# Patient Record
Sex: Male | Born: 1935 | Race: White | Hispanic: No | Marital: Married | Smoking: Former smoker
Health system: Southern US, Community
[De-identification: ages and names within clinical notes are randomized; demographics above are authoritative.]

## PROBLEM LIST (undated history)

## (undated) DIAGNOSIS — I2510 Atherosclerotic heart disease of native coronary artery without angina pectoris: Secondary | ICD-10-CM

## (undated) DIAGNOSIS — IMO0001 Reserved for inherently not codable concepts without codable children: Secondary | ICD-10-CM

## (undated) DIAGNOSIS — I82409 Acute embolism and thrombosis of unspecified deep veins of unspecified lower extremity: Secondary | ICD-10-CM

## (undated) DIAGNOSIS — Z9981 Dependence on supplemental oxygen: Secondary | ICD-10-CM

## (undated) DIAGNOSIS — Z8711 Personal history of peptic ulcer disease: Secondary | ICD-10-CM

## (undated) DIAGNOSIS — I509 Heart failure, unspecified: Secondary | ICD-10-CM

## (undated) DIAGNOSIS — Z923 Personal history of irradiation: Secondary | ICD-10-CM

## (undated) DIAGNOSIS — K922 Gastrointestinal hemorrhage, unspecified: Secondary | ICD-10-CM

## (undated) DIAGNOSIS — I10 Essential (primary) hypertension: Secondary | ICD-10-CM

## (undated) DIAGNOSIS — G4730 Sleep apnea, unspecified: Secondary | ICD-10-CM

## (undated) DIAGNOSIS — IMO0002 Reserved for concepts with insufficient information to code with codable children: Secondary | ICD-10-CM

## (undated) DIAGNOSIS — C3492 Malignant neoplasm of unspecified part of left bronchus or lung: Secondary | ICD-10-CM

## (undated) DIAGNOSIS — M1990 Unspecified osteoarthritis, unspecified site: Secondary | ICD-10-CM

## (undated) DIAGNOSIS — Z8719 Personal history of other diseases of the digestive system: Secondary | ICD-10-CM

## (undated) DIAGNOSIS — E119 Type 2 diabetes mellitus without complications: Secondary | ICD-10-CM

## (undated) DIAGNOSIS — C719 Malignant neoplasm of brain, unspecified: Secondary | ICD-10-CM

## (undated) DIAGNOSIS — M109 Gout, unspecified: Secondary | ICD-10-CM

## (undated) DIAGNOSIS — R0602 Shortness of breath: Secondary | ICD-10-CM

## (undated) DIAGNOSIS — Z9289 Personal history of other medical treatment: Secondary | ICD-10-CM

## (undated) DIAGNOSIS — J449 Chronic obstructive pulmonary disease, unspecified: Secondary | ICD-10-CM

## (undated) DIAGNOSIS — I639 Cerebral infarction, unspecified: Secondary | ICD-10-CM

## (undated) DIAGNOSIS — I4891 Unspecified atrial fibrillation: Secondary | ICD-10-CM

## (undated) DIAGNOSIS — R011 Cardiac murmur, unspecified: Secondary | ICD-10-CM

## (undated) DIAGNOSIS — C3491 Malignant neoplasm of unspecified part of right bronchus or lung: Secondary | ICD-10-CM

## (undated) DIAGNOSIS — E785 Hyperlipidemia, unspecified: Secondary | ICD-10-CM

## (undated) DIAGNOSIS — T4145XA Adverse effect of unspecified anesthetic, initial encounter: Secondary | ICD-10-CM

## (undated) DIAGNOSIS — F1010 Alcohol abuse, uncomplicated: Secondary | ICD-10-CM

## (undated) DIAGNOSIS — T7840XA Allergy, unspecified, initial encounter: Secondary | ICD-10-CM

## (undated) HISTORY — DX: Atherosclerotic heart disease of native coronary artery without angina pectoris: I25.10

## (undated) HISTORY — PX: CATARACT EXTRACTION W/ INTRAOCULAR LENS  IMPLANT, BILATERAL: SHX1307

## (undated) HISTORY — DX: Reserved for inherently not codable concepts without codable children: IMO0001

## (undated) HISTORY — DX: Hyperlipidemia, unspecified: E78.5

## (undated) HISTORY — DX: Reserved for concepts with insufficient information to code with codable children: IMO0002

## (undated) HISTORY — PX: COLON SURGERY: SHX602

## (undated) HISTORY — PX: TOTAL KNEE ARTHROPLASTY: SHX125

## (undated) HISTORY — PX: JOINT REPLACEMENT: SHX530

## (undated) HISTORY — PX: INCISE AND DRAIN ABCESS: PRO64

---

## 1999-08-22 ENCOUNTER — Ambulatory Visit (HOSPITAL_COMMUNITY): Admission: RE | Admit: 1999-08-22 | Discharge: 1999-08-22 | Payer: Self-pay | Admitting: *Deleted

## 1999-08-22 HISTORY — PX: CARDIAC CATHETERIZATION: SHX172

## 2000-01-23 ENCOUNTER — Other Ambulatory Visit: Admission: RE | Admit: 2000-01-23 | Discharge: 2000-01-23 | Payer: Self-pay | Admitting: Gastroenterology

## 2000-01-23 ENCOUNTER — Encounter (INDEPENDENT_AMBULATORY_CARE_PROVIDER_SITE_OTHER): Payer: Self-pay

## 2000-01-24 ENCOUNTER — Other Ambulatory Visit: Admission: RE | Admit: 2000-01-24 | Discharge: 2000-01-24 | Payer: Self-pay | Admitting: Gastroenterology

## 2000-05-02 ENCOUNTER — Ambulatory Visit (HOSPITAL_COMMUNITY): Admission: RE | Admit: 2000-05-02 | Discharge: 2000-05-02 | Payer: Self-pay | Admitting: Internal Medicine

## 2000-05-16 ENCOUNTER — Inpatient Hospital Stay (HOSPITAL_COMMUNITY): Admission: RE | Admit: 2000-05-16 | Discharge: 2000-05-21 | Payer: Self-pay

## 2002-09-09 DIAGNOSIS — T8859XA Other complications of anesthesia, initial encounter: Secondary | ICD-10-CM

## 2002-09-09 HISTORY — DX: Other complications of anesthesia, initial encounter: T88.59XA

## 2002-10-04 ENCOUNTER — Encounter: Payer: Self-pay | Admitting: Emergency Medicine

## 2002-10-04 ENCOUNTER — Inpatient Hospital Stay (HOSPITAL_COMMUNITY): Admission: EM | Admit: 2002-10-04 | Discharge: 2002-10-14 | Payer: Self-pay | Admitting: Emergency Medicine

## 2002-10-05 ENCOUNTER — Encounter (INDEPENDENT_AMBULATORY_CARE_PROVIDER_SITE_OTHER): Payer: Self-pay | Admitting: *Deleted

## 2002-10-08 ENCOUNTER — Encounter: Payer: Self-pay | Admitting: Cardiovascular Disease

## 2002-10-11 HISTORY — PX: CARDIAC CATHETERIZATION: SHX172

## 2002-10-12 ENCOUNTER — Encounter: Payer: Self-pay | Admitting: *Deleted

## 2002-10-13 ENCOUNTER — Encounter: Payer: Self-pay | Admitting: Cardiovascular Disease

## 2002-11-01 ENCOUNTER — Ambulatory Visit (HOSPITAL_COMMUNITY): Admission: RE | Admit: 2002-11-01 | Discharge: 2002-11-01 | Payer: Self-pay | Admitting: Internal Medicine

## 2003-09-10 DIAGNOSIS — I82409 Acute embolism and thrombosis of unspecified deep veins of unspecified lower extremity: Secondary | ICD-10-CM

## 2003-09-10 HISTORY — DX: Acute embolism and thrombosis of unspecified deep veins of unspecified lower extremity: I82.409

## 2003-11-30 ENCOUNTER — Inpatient Hospital Stay (HOSPITAL_COMMUNITY): Admission: RE | Admit: 2003-11-30 | Discharge: 2004-01-17 | Payer: Self-pay | Admitting: Orthopedic Surgery

## 2004-01-07 ENCOUNTER — Encounter: Payer: Self-pay | Admitting: Internal Medicine

## 2007-01-23 ENCOUNTER — Encounter: Admission: RE | Admit: 2007-01-23 | Discharge: 2007-01-23 | Payer: Self-pay | Admitting: Internal Medicine

## 2007-04-15 ENCOUNTER — Ambulatory Visit (HOSPITAL_COMMUNITY): Admission: RE | Admit: 2007-04-15 | Discharge: 2007-04-15 | Payer: Self-pay | Admitting: Internal Medicine

## 2007-05-01 ENCOUNTER — Ambulatory Visit (HOSPITAL_COMMUNITY): Admission: RE | Admit: 2007-05-01 | Discharge: 2007-05-01 | Payer: Self-pay | Admitting: *Deleted

## 2007-05-01 HISTORY — PX: CARDIAC CATHETERIZATION: SHX172

## 2011-01-22 NOTE — Cardiovascular Report (Signed)
Raymond Chaney, Raymond Chaney           ACCOUNT NO.:  [id number]   MEDICAL RECORD NO.:  [id number]          PATIENT TYPE:  OIB   LOCATION:  2899                         FACILITY:  [facility]   PHYSICIAN:  Darlin Priestly, MD  DATE OF BIRTH:  [birth year]   DATE OF PROCEDURE:  05/01/2007  DATE OF DISCHARGE:  05/01/2007                            CARDIAC CATHETERIZATION   PROCEDURE:  1. Left heart catheterization.  2. Coronary angiogram.  3. Left ventriculogram.   COMPLICATIONS:  None.   INDICATIONS:  Mr. Wellen is a 77 year old male patient of Dr. Felipa Eth,  with a history of known CAD with anomalous coronaries with last cath in  2004 revealing a scattered noncritical CAD.  He has had a markedly  abnormal Cardiolite in the past, as this been relatively unreliable.  He  has also had a history of extensive alcohol use as well as a DVT.  He is  not felt to be a good Coumadin candidate.  He does have a [place] IVC  filter in place.  He recently has complained of increasing shortness of  breath.  He is now brought for repeat catheterization to reassess  coronary anatomy.   DESCRIPTION:  After informed consent the patient was brought to the  cardiac cath lab where the right groin was shaved, prepped and draped in  a sterile fashion.  __________ monitor was established using modified  Seldinger #6 technique.  A #6 French arterial sheath was inserted into  the right femoral artery.  A 6-French diagnostic catheter was then used  to perform diagnostic angiography.   FINDINGS:  1. The LAD originates from the left cusp and is a moderate-sized      vessel coursing the apex and has 1 diagonal branch.  The LAD has      mild 40% midvessel narrowing after the takeoff of the first      diagonal.  2. The first diagonal is small-to-medium sized vessel which 50% ostial      lesion.  3. The right coronary artery is a large vessel which is dominant.  It      gives rise to both a PDA as well as a left  posterolateral branch      and comes off the right coronary cusp.  There is 50-60% proximal      RCA stenosis as well as 1/4% distal RCA stenosis.  The PDA and      posterior lateral branch have no significant disease.  4. The left circumflex originates from the left coronary cusp.  There      is a 50% ostial lesion.  There is diffuse 40% mid-and-distal      circumflex disease, but no high-grade stenosis.  5. The first OM is a small-to-medium sized vessel which bifurcates      distally with no significant disease.   LEFT VENTRICULOGRAM:  Reveals a preserved EF of 60%.   HEMODYNAMIC SYSTEM:  Carotid artery pressure 147/61, LV system pressure  147/5, LVEDP of 13.   CONCLUSIONS:  1. 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  2. Normal left ventricular systolic function.  3. 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      Darlin Priestly, MD  Electronically Signed     RHM/MEDQ  D:  05/01/2007  T:  05/02/2007  Job:  [id number]   cc:   Larina Earthly, M.D.

## 2011-01-25 NOTE — Consult Note (Signed)
NAME:  Raymond Chaney, Raymond Chaney                     ACCOUNT NO.:  [id number]   MEDICAL RECORD NO.:  [id number]                   PATIENT TYPE:  INP   LOCATION:  2108                                 FACILITY:  [facility]   PHYSICIAN:  Shan Levans, M.D. [facility]            DATE OF BIRTH:  Jun 19, 1932   DATE OF CONSULTATION:  12/16/2003  DATE OF DISCHARGE:                                   CONSULTATION   This is a 77 year old white male admitted to [facility] on November 30, 2003 for elective left total knee replacement for severe osteoarthritis.  Unfortunately, the patient did not reveal a heavy drinking history  preoperatively, and postoperatively, he goes into acute delirium with DTs.  He has a previous history of nonobstructive coronary artery disease, CVA,  remote GI bleed, hard of hearing, gout, hypothyroidism, asthma, hip surgery  30 years ago, and previous colon resection.  He is transferred to [facility] from [place] because of development of acute renal failure and  progressive delirium and hypoxic respiratory failure.  Tonight, we are asked  to see the patient because of hypoxic respiratory failure and the acute  renal failure process.  The patient has been getting progressively more  confused, having leukocytosis, not swallowing properly, having some low-  grade fever, coughing, congestion, and blood-streaked mucus is being  expectorated.  He is quite agitated, climbing out of bed, pulling at things,  throwing items at staff, kicking and biting at staff, pulling at wrist  restraints and, in general, not being cooperative.  At this point, he is  transferred to the [address] Unit, and has gotten progressively worse.   PAST MEDICAL HISTORY:  As noted above.   CURRENT MEDICATIONS:  Warfarin daily, multivitamins, thiamine, folic acid,  potassium chloride, Lopressor, Protonix, Valium, Ventolin, Atrovent, Ensure,  diltiazem, senna, and multiple p.r.n. medications.   ALLERGIES:   Penicillin.   Family history, social history, review of systems otherwise are not very  contributory.  He does drink alcohol excessively, a fifth of vodka a day.   PHYSICAL EXAMINATION:  VITAL SIGNS:  Blood pressure 120/40, temp 99, pulse  95, sat 82% on 100% nonrebreather.  CHEST:  Showed coarse rhonchi.  Poor air movement.  Expired wheezes.  EXTREMITIES:  Showed trace edema.  ABDOMEN:  Distended, bowel sounds hypoactive.  NEUROLOGIC:  Agitated, pulling at lines and tubes.  HEENT:  No jugulovenous distention.  No lymphadenopathy.  CARDIAC:  Resting tachycardia without S3.  Normal S1, S2.   LABORATORY DATA:  Potassium 6, CO2 19, creatinine 4, sodium 141, calcium  8.5, BNP 177.   IMPRESSION:  Aspiration pneumonia, hypoxic respiratory failure, status post  left total knee replacement, hyperkalemia, acute renal failure.  Patient  will need dialysis soon.  Needs potassium treated with Kayexalate, sodium  bicarb, insulin and D-50.  Needs full vent support, intubation, place  central line, add cefepime and Flagyl for aspirations, culture blood and  sputum,  increase nebs, strict NPO, place Panda, place arterial line, needs  close followup in intensive care.                                               Shan Levans, M.D. [facility]    PW/MEDQ  D:  12/16/2003  T:  12/18/2003  Job:  [id number]   cc:   Larina Earthly, M.D.  [address]  [place]  [place] [address]  Fax: [phone]   Ollen Gross, M.D.  [facility]  [address]  [address]  [place]  [place] [address]  Fax: [phone]   Maree Krabbe, M.D.  Fax: [phone]

## 2011-01-25 NOTE — H&P (Signed)
NAME:  Raymond Chaney, Raymond Chaney                     ACCOUNT NO.:  [id number]   MEDICAL RECORD NO.:  [id number]                   PATIENT TYPE:  INP   LOCATION:  4098                                 [facility]ACILITY:  [facility]   PHYSICIAN:  Ollen Gross, M.D.                 DATE OF BIRTH:  1931/11/11   DATE OF ADMISSION:  11/30/2003  DATE OF DISCHARGE:                                HISTORY & PHYSICAL   DATE OF OFFICE VISIT HISTORY AND PHYSICAL:  November 24, 2003   CHIEF COMPLAINT:  Bilateral knee pain.   HISTORY OF PRESENT ILLNESS:  The patient is a 77 year old male known to Dr.  Lequita Halt for significant bilateral knee pain in the right, seems to be more  symptomatic than the left.  He has been seen several times in the past  secondary to his arthritis.  He has undergone cortisone injections, also  Synvisc in the past, but his knees have continued to progressively get  worse.  He is at a point now where he is having continued pain and  difficulty with ambulating.  It is felt he would be an appropriate candidate  for knee replacement.  Risks and benefits of the procedure have been  discussed with the patient and he elects to proceed with surgery.  The  patient does have an extensive cardiac history and has been seen by Dr.  Jenne Campus preoperatively.  The patient states he has had a recent  echocardiogram.  Echocardiogram report is pending at the time of this  dictation.   ALLERGIES:  1. PENICILLIN causes rash.  2. CODEINE causes rash.   CURRENT MEDICATIONS:  1. Plavix 75 mg daily stopped prior to surgery.  2. Potassium 20 mEq one-half tablet daily.  3. Avapro 150 mg daily.  4. Furosemide 20 mg daily.  5. He also takes a thyroid supplement which he does not recall the name of.   PAST MEDICAL HISTORY:  1. Asthma.  2. History of congestive heart failure.  3. Gout.  4. History of ulcers.  5. History of gastrointestinal bleed secondary to his ulcers.  6. History of transfusions with his  gastrointestinal bleed.  7. Hypothyroidism.  8. Hypertension.  9. Hypercholesterolemia.  10.      Irregular heart beat which sounds like atrial fibrillation.  11.      History of stroke 30 years ago.  12.      Coronary arterial disease.   PAST SURGICAL HISTORY:  1. Left knee arthroscopy 10 years ago.  2. He has undergone a craniotomy with burr hole procedure.  3. Exploratory laparotomy with partial colectomy.  4. Repair of a left eye laceration childhood years.  5. He has undergone colposcopy x2.  6. Hip abscess infection with I&D.   FAMILY HISTORY:  Brother with diabetes.  Another brother with a CABG.  Another brother with pancreatic cancer.  His mother deceased age 67, father  deceased age 27 with a  history of diabetes.   SOCIAL HISTORY:  Married, three children.  Quit smoking about 12-13 years  ago.  Did smoke two packs per day for approximately 50 years.  He drinks a  fifth of liquor on a daily basis - significant alcohol intake.   REVIEW OF SYSTEMS:  GENERAL:  No fever, chills, or night sweats. NEURO:  He  has had a previous stroke with some residual right-sided weakness.  Ever  since his stroke he has had some occasional blackout spells when he lies  down flat.  He denies any recent blackout spells in the recent past.  RESPIRATORY:  He is short of breath with any type of short exertion.  There  is no specific shortness of breath at rest.  No productive cough or  hemoptysis.  CARDIOVASCULAR:  He denies any chest pain, angina, or orthopnea  at this time, no palpitations.  GI:  He does give some history of loose  stools and bowels occasionally.  There has been no nausea, vomiting, no  constipation, no blood or mucus.  GU:  No dysuria, hematuria, or discharge.  MUSCULOSKELETAL:  Pertinent to that of both knees found in the history of  present illness.   PHYSICAL EXAMINATION:  VITAL SIGNS:  Pulse 78, respirations 18, blood  pressure 132/64.  GENERAL:  The patient is a  77 year old white male, well-nourished, well-  developed, large frame, overweight.  He is alert and oriented and  cooperative at time of the office visit.  He is accompanied by his wife.  Appears to be an average historian.  HEENT:  Normocephalic, atraumatic.  Pupils round and reactive.  Oropharynx  is clear.  Neck is supple.  He does have upper and lower denture plates  noted.  No carotid bruits are appreciated.  CHEST:  Distant breath sounds.  Faint inspiratory wheeze in the left mid and  lower chest wall.  Otherwise, lungs are clear, no rhonchi or rales.  HEART:  He does have an irregular rhythm at times, with some run of a  regular rhythm, atrial fibrillation versus some type of ectopic beat.  Pulse  is ranging around 75-80.  ABDOMEN:  Soft, round, slightly protuberant abdomen.  Bowel sounds are  present.  RECTAL, BREAST, GENITALIA:  Not done, not pertinent to present illness.  EXTREMITIES:  Both knees:  The left knee shows varus malalignment deformity  and range of motion of 5-120 degrees with marked crepitus noted.  The right  knee also shows a varus malalignment deformity with range of motion of 5-110  degrees with marked crepitus noted on passive range of motion.   IMPRESSION:  1. Osteoarthritis bilateral knees, right greater than left.  2. Hypertension.  3. Paroxysmal atrial fibrillation.  4. History of stroke approximately 30 years ago with some residual right-     sided weakness.  5. History of congestive heart failure.  6. Gout.  7. Hypothyroidism.  8. Hypercholesterolemia.  9. Asthma.  10.      History of ulcers.  11.      History of gastrointestinal bleed secondary to ulcers.  12.      History of multiple transfusions secondary to gastrointestinal     bleed.   PLAN:  The patient will be admitted to [facility] and undergo a  left total knee replacement arthroplasty.  Surgery will be performed by Dr. Ollen Gross.  Dr. Jenne Campus, his cardiologist, will be  consulted to assist  with cardiac management of the patient postoperatively.  His  medical doctor  is Dr. Felipa Eth.  Dr. Felipa Eth will be also consulted to assist with medical  management of the patient postoperatively.     Alexzandrew L. Julien Girt, P.A.              Ollen Gross, M.D.    ALP/MEDQ  D:  12/03/2003  T:  12/03/2003  Job:  [id number]   cc:   Larina Earthly, M.D.  [address]  [place]  [place] [address]  Fax: [phone]   Darlin Priestly, M.D.  [phone] [address], [address]  [place]  [place] [address]  Fax: [phone]

## 2011-01-25 NOTE — Op Note (Signed)
NAME:  Raymond Chaney, Raymond Chaney                     ACCOUNT NO.:  [id number]   MEDICAL RECORD NO.:  [id number]                   PATIENT TYPE:  INP   LOCATION:  X003                                 FACILITY:  [facility]   PHYSICIAN:  Ollen Gross, M.D.                 DATE OF BIRTH:  October 20, 1931   DATE OF PROCEDURE:  11/30/2003  DATE OF DISCHARGE:                                 OPERATIVE REPORT   PREOPERATIVE DIAGNOSIS:  Osteoarthritis of the left knee.   POSTOPERATIVE DIAGNOSES:  Osteoarthritis of the left knee.   OPERATION/PROCEDURE:  Left total knee arthroplasty.   SURGEON:  Ollen Gross, M.D.   ASSISTANT:  Alexzandrew L. Perkins, P.A.-C.   ANESTHESIA:  General.  Postoperative pain catheter.   ESTIMATED BLOOD LOSS:  Minimal.   DRAINS:  Hemovac x1.   COMPLICATIONS:  None.   TOURNIQUET TIME:  52 minutes at 300 mmHg.   DISPOSITION:  Stable to recovery.   BRIEF CLINICAL NOTE:  Mr. Raymond Chaney is a 77 year old male with end-stage  osteoarthritis of the left knee with pain refractory to nonoperative  management.  He has had extensive nonoperative management including  injections for several years.  He presents now for left total knee  arthroplasty.   DESCRIPTION OF PROCEDURE:  After the successful administration of general  anesthesia, tourniquet was placed high on the left thigh and left lower  extremity prepped and draped in the usual sterile fashion. The extremity was  wrapped in Esmarch, knee flexed, tourniquet inflated to 300 mmHg.   Standard midline incision was made with a 10-blade through the subcutaneous  tissue to the level of the extensor mechanism.  A fresh blade was used to  make a medial parapatellar arthrotomy and the soft tissue of proximal medial  tibia subperiosteally elevated to the joint line with the knife into the  semimembranosus bursa with curved osteotome.  Soft tissue over the proximal  lateral tibia was also elevated with attention being paid to avoiding  the  patellar tendon on the tibial tubercle.  Patella was then everted and knee  flexed 90 degrees and the ACL and PCL removed.  Drill was used to create a  starting hole at the distal femur.  The canal was irrigated.  A five-degree  left valgus alignment guide was placed.  Referencing off the posterior  condyles rotations were marked and the block pinned to remove 10 mm off the  distal femur.  Distal femoral resection was made with an oscillating saw.  Sizing block was placed and size 5 was most appropriate.  A size 5 cutting  block was placed and anterior and posterior cuts made.   Tibia was subluxed forward and the menisci removed.  Extramedullary tibial  alignment guide placed surfacing proximally at the medial aspect of the  tibial tubercle and distally along the second metatarsal axis and tibial  crest.  A block was pinned to remove 10 mm of the  nondeficient lateral side.  Tibial resection was made with an oscillating saw.  He had massive medial  osteophytes which were removed.  A size 5 was most appropriate tibial  component and the proximal tibia was prepared with a modular drill and keel  punch.  Femoral preparation was then completed with the intercondylar and  chamfer cuts.   Trial size 5 posterior stabilized femur, size 5 mobile bearing tibial tray,  and a 10 mm posterior stabilized rotating flap inserts were placed.  With  the 10, full extension was achieved with excellent varus and valgus balance  throughout the full range of motion.  The patella was again everted.  Thickness measured 27 mm, free-hand resection taken to 15 mm, a 41 template  was placed, lug holes were drilled, trial patella was placed, and it tracks  normally.  Osteophytes were then  removed off the posterior femur with the  femoral trial in place.  All trials were removed and the cut bone surfaces  were then prepared with pulsatile lavage.  Cement was mixed and once ready  for implantation, the size 5  mobile bearing tibial tray, size 5 posterior  stabilized femur and 41 patella cemented into place and patella held with a  clamp.  The 10 mm trial insert was placed and the knee held in full  extension and all extruded cement removed.  Once the cement had fully  hardened and the permanent size 10 mm posterior stabilized rotating platform  insert was placed into the tibial tray.  The wound was then  copiously  irrigated with antibiotic solution and the extensor mechanism closed over a  Hemovac drain with interrupted #1 PDS.  The tourniquet was released after a  total time of 52 minutes.  Flexion against gravity is 135 degrees.  Subcutaneous tissues were then closed with interrupted 2-0 Vicryl,  subcuticular running 4-0 Monocryl.  Catheter for pain pump is placed and the  pain pump initiated.  Steri-Strips and a bulky sterile dressing applied.  He  was placed in a knee immobilizer, awakened and transported to recovery in  stable condition.                                               Ollen Gross, M.D.    FA/MEDQ  D:  11/30/2003  T:  11/30/2003  Job:  [id number]   cc:   Larina Earthly, M.D.  [address]  [place]  [place] [address]  Fax: [phone]   Delman Cheadle, MD  [address]  [place]  [place] [address]  Fax: [phone]

## 2011-01-25 NOTE — Discharge Summary (Signed)
NAME:  Raymond Chaney, Raymond Chaney                     ACCOUNT NO.:  [id number]   MEDICAL RECORD NO.:  [id number]                   PATIENT TYPE:  INP   LOCATION:  3728                                 FACILITY:  [facility]   PHYSICIAN:  Larina Earthly, M.D.                     DATE OF BIRTH:  1931/10/10   DATE OF ADMISSION:  12/16/2003  DATE OF DISCHARGE:  01/17/2004                                 DISCHARGE SUMMARY   DISCHARGE DIAGNOSES:  1. Left total knee replacement on November 30, 2003, by Dr. Ollen Gross.  2. Degenerative joint disease and degenerative disc disease.  3. Gout.  4. Toxic metabolic encephalopathy secondary to prolonged alcohol use.  5. Alcohol withdrawal resulting in delirium tremens and delirium.  6. 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  7. Atrial fibrillation now in normal sinus rhythm.  8. Coronary artery disease, noncritical.  9. Congestive heart failure with normal ejection fraction possibly related     to volume overload and tachycardia with diastolic dysfunction.  10.      Hyperlipidemia.  11.      Chronic obstructive pulmonary disease with history of extensive     tobacco abuse, but currently tolerating beta-blockade, off of all oxygen     requirements, but on nebulizer treatments.  12.      Deep venous thrombosis prophylaxis initially, however, failed     secondary to gastrointestinal bleed now with permanent catheter for left     calf deep venous thrombosis.  13.      Left calf deep venous thrombosis diagnosed on January 02, 2004,     initially anticoagulated with therapeutic levels of Lovenox, but then     discontinued secondary to gastrointestinal bleed as above.  14.      The patient was maintained on proton pump inhibitors for peptic     ulcer disease, gastroesophageal reflux disease and history of     gastrointestinal bleed.  15.      Fever with multiple cultures from  urine and blood negative and     fever workup negative.  However, during prolonged hospitalization, the     patient was treated empirically with Avelox, Flagyl, cefepime and     vancomycin for aspiration pneumonia, enterococcal urinary tract     infection, Staphylococcus aureus bacteremia in one out of several     cultures and diarrhea with Clostridium difficile toxin being negative.     Again, the patient did have recurrent fevers with no etiology     ascertained; however, definitely during his hospitalization was plagued     by aspiration pneumonia and the above-mentioned urinary tract infections.     Currently, the patient is afebrile.  16.      Anemia requiring erythropoietin protocol as well as iron therapy     and packed red blood cell  transfusion secondary to rectal bleeding.  17.      Malnutrition requiring tube feeds, total nutritional alimentation     in the setting of ileus and dysphagia secondary to toxic metabolic     encephalopathy and debility, currently taking oral nutrition with no     evidence of aspiration.  18.      Ileus, now resolved.  19.      Acute renal failure, probably multifactorial in etiology secondary     to prerenal azotemia, acute tubular necrosis in the setting of using     diuretics, nonsteroidal antiinflammatory agent, angiotensin-receptor     blockers and colchicine now resolved, but with Foley catheter in place     secondary to decubital ulcers; questionable plans for hemodialysis on     December 17, 2003.  20.      Type 2 diabetes mellitus, stable on insulin.  21.      Decubital ulcer with Foley in place, stage I on the right heel and     stage III ulcers on the left buttock with superior ulcer on the left     buttock measuring 3 x 2 cm and inferior ulcer measuring 3 x 1 cm treated     with Restore, turning and KinAir bed.  22.      Acute respiratory failure secondary to toxic metabolic     encephalopathy, delirium tremens, aspiration pneumonia  requiring     suctioning and frequent nebulizers after extubation on December 27, 2003.  23.      Cellulitis involving the right finger, now resolving with wet-to-     dry dressing changes followed by Dr. Lequita Halt.  24.      Gastrointestinal bleed with large volume hematochezia and     hypotension requiring packed red blood cell transfusion on January 07, 2004.  Colonoscopy performed by Dr. Luther Parody on Jan 08, 2004, revealing     diverticulosis and hemorrhoids.  25.      Significant physical deconditioning followed by occupational     therapy and physical therapy.  26.      History of cerebrovascular accident 30 years ago.  27.      History of questionable hypothyroidism.  28.      Hyperlipidemia and history of gastrointestinal bleeds prior to this     hospitalization.   DISCHARGE MEDICATIONS:  1. Ensure t.i.d.  2. Lasix 40 mg p.o. q.d.  3. Lantus 14 units q.12h. subcu.  4. Sliding scale insulin q.a.c. and q.h.s., 0-60 hypoglycemic protocol, 60-     100;  0 units; 101-150, three units; 151-200, four units; 201-250, eight     units; 251-300, 12 units; 301-350, 16 units; greater than 350, 20 units     and call M.D.  5. Xopenex 1.25 mg and Atrovent 0.5 mg nebulizers q.6h. while awake.  6. Magic mouthwash 10 cc q.i.d. swish and spit x5 days.  7. Miconazole antifungal powder b.i.d. to the buttocks.  8. Megace 400 mg p.o. b.i.d.  9. Ritalin 2.5 mg p.o. q.d. in the morning.  10.      Lopressor 50 mg p.o. b.i.d.  11.      Prilosec 20 mg p.o. q.d.  12.      Potassium chloride 40 mEq p.o. q.d.  13.      Ambien 10 mg p.o. q.h.s. p.r.n.  14.      Tylenol 650 mg p.o. q.6h. p.r.n.   SPECIAL INSTRUCTIONS:  1. The patient will need continued  wound care of his decubitus ulcers to     include Restore every six days and p.r.n., frequent turning and the use     of a KinAir bed.  2. The patient will need Geo-Matt to chair and a Hoyer lift to move the    patient from bed to chair on a regular daily  basis pending improved     mobility under the direction and physical and occupational therapy     consults.  3. The patient will need wet-to-dry dressing changes q.d. to the right fifth     digit.  4. Antifungal powder to the buttocks b.i.d.  5. Staff will need to call Dr. Deri Fuelling office for followup.  6. The patient will need a CBC, CMET and TSH on Jan 19, 2004, as well as a     CBC and CMET on Jan 23, 2004.  7. The patient will need consults from PT, OT and nutrition.  8. Staff will need to ensure daily bowel movement with adequate bowel     regimen.  9. The patient has an allergy to PENICILLIN.   HOSPITAL COURSE:  This is a 77 year old, Caucasian male who has multiple  medical problems well-outlined above who presented for an elective total  knee replacement under Dr. Lequita Halt; however, he failed to informed the staff  that he was drinking approximately a fifth to half-gallon of hard liquor on  a daily basis for a prolonged period of time.  His postoperative course was  complicated by atrial fibrillation, delirium tremens, alcohol withdrawal and  subsequent toxic metabolic encephalopathy requiring prolonged supportive  care.  During his hospitalization, his mental status continued to be poor  until the week prior to discharge and transfer to skilled nursing facility  for further rehabilitation.  At the time, he has been treated with multiple  benzodiazepines, Depakote, but despite these measures continued to have  slurred speech, was moaning and had poor mental status with disorientation,  hallucinations and combativeness on occasions.  He did pull out multiple  lines and was agitated frequently.  Head CT revealed old cerebellar stroke,  but no acute stroke.  However, given time and discontinuation of all  sedatives, currently he has some slight dysarthria, but responds to all  questions appropriately and asks questions appropriately and is very anxious  to undergo his  rehabilitation process and states that he will remain off of  alcohol and has learned his lesson after a prolonged seven-week  hospitalization.   The patient did have several issues with atrial fibrillation and were  treated with calcium channel blockade anticoagulation in the setting of a  normal ejection fraction.  His tachycardia was slowly and eventually  controlled with beta-blockade which he has tolerated well despite his  history of COPD.   The patient does have a history of noncritical coronary artery disease and  is followed by [facility] and has a normal  ejection fraction; however, last EF was 45-50%.  He was treated with calcium  channel blockade, angiotensin-receptor blocker and Lasix and currently he  remains stable on beta-blockade and Lasix and is in a normal sinus rhythm.  Anticoagulation has been deferred given his GI bleed and his presumed history of noncompliance with multiple medications in the setting of  significant alcohol abuse and given the fact that he may not be a good  candidate for followup with an adequate measure of safety once discharged to  home.   The patient does have a  history of hyperlipidemia which will need to be  reassessed in the future.  The patient's COPD required constant nebulizer  treatments to remain without bronchospasm, however, he is currently stable  on above measures with Xopenex and Atrovent.  The patient did develop a  left, mid, posterior calf DVT on January 02, 2004, and was treated with subcu  Lovenox at therapeutic doses with plans to transfer the patient to Coumadin.  However, he suffered from a GI bleed requiring placement of an IVC filter by  interventional radiology without complications.  The patient was maintained  on proton pump inhibitors throughout his hospitalization.   Again, the patient had recurrent fevers initially without any evidence of  infectious etiology, however, he later developed  aspiration pneumonia,  enterococcal urinary tract infection, diarrhea that was all treated based on  culture results and/or empirically with Avelox, cefepime, Flagyl and  vancomycin.  He did have intermittent leukocytosis along with these fevers.  Currently, he is without any evidence of infection and is off all  antibiotics.   The patient did have significant issues with anemia complicated with GI  bleed and he was maintained on erythropoietin protocols as well as iron  therapy for a large portion of his hospitalization.  He is currently stable  and has not received any packed red blood cell transfusions for at least two  weeks.   Malnutrition was a significant issue requiring tube feeds and then TNA given  the development of an ileus in the setting of decreased mobility and skin  breakdown.  TNA was stopped approximately three days ago, however, he did  initially have some dysphagia secondary to his toxic metabolic  encephalopathy and debility.  Currently, nutrition is working carefully with  the patient to ensure adequate nutritional intake and this will need to be  continued at [facility] where I believe he has a bed.  The patient did develop acute renal failure as mentioned above and this is  now completely resolved with aggressive measures including IV fluids,  discontinuation of nonsteroidal antiinflammatory agents as well as  angiotensin-receptor blocker and reduction of Lasix intake.   The patient's diabetes was well-controlled throughout his hospitalization.  The patient did develop decubitus ulcers as mentioned above and described  above and will need continuous monitoring by staff at skilled nursing  facility.  The patient did develop acute respiratory failure requiring  prolonged intubation with difficulty weaning the patient secondary to his  encephalopathy despite good weaning parameters.  He is now on room air and oxygenating very well at 98%.    The patient did develop a right finger cellulitis which was followed by Dr.  Ollen Gross on a regular basis and is quickly resolving.   The patient was deemed ready for discharge on Jan 17, 2004, with careful  followup in the skilled nursing facility for rehabilitation considerations,  continued care of his decubital ulcers and assessment of adequate nutrition  and bowel regimen.  It is hopeful that the patient will be eventually  discharged home under the support of his wife and hopefully discontinuation  of all alcohol products in the future.                                                Larina Earthly, M.D.    RA/MEDQ  D:  01/17/2004  T:  01/17/2004  Job:  [id number]

## 2011-01-25 NOTE — Discharge Summary (Signed)
[facility]  Patient:    Raymond Chaney, Raymond Chaney                  MRN: [id number] Adm. Date:  [id number] Disc. Date: [id number] Attending:  Gennie Alma CC:         Lennon Alstrom. Felipa Eth, M.D.  Venita Lick. Pleas Koch., M.D. [facility]   Discharge Summary  FINAL DIAGNOSES: 1. Chronic diverticulitis with focal rupture of descending colon. 2. Chronic obstructive pulmonary disease. 3. Hypertension. 4. Partially removed suspicious-appearing polyp of proximal descending colon.  HISTORY OF PRESENT ILLNESS:  This 77 year old male patient had a history of bright red bleeding from his rectum in very small volumes.  Physical examination by Dr. Larina Earthly revealed Hemoccult-positive stools.  He underwent a colonoscopy by Dr. Claudette Head on Jan 23, 2000, and 10 colon polyps were removed.  The largest polyp was located in the proximal descending colon and was 4 cm in diameter and multilobulated.  The specimen was very difficult to remove and was unable to be retrieved.  The patient was advised to screen his stools.  However, he was unable to recover the polyp himself, and since there was never any surgical specimen from the initial colonoscopy, he was referred for removal of that portion of his colon.  PAST MEDICAL HISTORY:  Positive for hypertension.  PHYSICAL EXAMINATION:  Unremarkable.  HOSPITAL COURSE:  The patient took a preoperative bowel prep the day prior to admission.  On the day of admission he underwent follow-up colonoscopy by Dr. Claudette Head, and the area in question was marked with [place] ink.  He was then brought to the operating room, and a left colectomy was done.  Pathologic examination of the specimen revealed that the area thought to be harboring a polyp was actually edematous colonic mucosa due to focal rupture of a diverticulum.  There is no evidence of malignancy.  The patient tolerated the procedure well and had an uneventful postoperative course  being discharged on the fifth postoperative day at which time he was afebrile, feeling good, having bowel movements, and passing flatus.  He was tolerating a full liquid diet, his incision was healing, and two-thirds of his staples were removed and Steri-Strips applied.  He was discharged with further care as an outpatient.  CONDITION ON DISCHARGE:  Satisfactory.  DISCHARGE INSTRUCTIONS:  Diet: Ad lib.  Activity:  Limited, no strenuous activities.  DISCHARGE MEDICATIONS: 1. Altace 2.5 mg b.i.d. 2. Tylox one q.6h. p.r.n. for pain.  FOLLOWUP:  He is to return to our office on May 26, 2000, for further follow-up. DD:  06/04/99 TD:  06/04/00 Job: [id number]

## 2011-01-25 NOTE — Procedures (Signed)
[facility]  Patient:    Raymond Chaney, Raymond Chaney                  MRN: [id number] Proc. Date: 05/16/00 Adm. Date:  [id number] Attending:  Gennie Alma CC:         Milus Mallick, M.D.  Ravi R. Felipa Eth, M.D.   Procedure Report  PROCEDURE:  Colonoscopy with tattoo of polyp.  ENDOSCOPIST:  Venita Lick. Pleas Koch., M.D.  PHYSICAL EXAMINATION:  CHEST:  Decreased breath sounds bilaterally.  CARDIAC: Regular rate and rhythm, without murmur.  NEUROLOGIC:  Alert and oriented x 3.  ANESTHESIA:  Fentanyl 100 mcg IV, Versed 7.5 mg IV.  MONITORING:  Automated blood pressure monitor, pulse oximeter and cardiac monitor.  Low-flow oxygen was given by nasal cannula throughout the procedure. The procedure was well-tolerated with no immediate complications.  DESCRIPTION OF PROCEDURE:  After the nature of the procedure was discussed with the patient, including discussion of its risks, benefits and alternatives, he consented to proceed.  He was then comfortably sedated in the left lateral decubitus position.  Digital rectal examination was unremarkable. The Olympus pediatric video colonoscope was inserted in the rectal vault, air was insufflated and the colonoscope was advanced to the mid transverse colon. Bowel preparation was noted to be fair and multiple washes were applied to the bowel wall to remove residual solid and liquid stool.  The mid transverse colon appeared unremarkable.  In the region of the splenic flexure, there was a residual polyp noted, corresponding to the site that was previously partially removed; this measured at 70 cm from the anal verge.  The distal margin of the polyp was tattooed with 2 cc of [place] ink for preoperative marking of the lesion.  The descending colon appeared unremarkable except for diverticulosis.  Diverticulosis was noted throughout the sigmoid colon. Examination in the rectum including retroflexed view were unremarkable.   The colon was decompressed and the colonoscope was withdrawn from the patient.  IMPRESSION 1. Colonoscopy to transverse colon. 2. Two-centimeter sessile residual polyp at 70 cm. 3. Diverticulosis. 4. Tattoo of polyp at distal margin. 5. Fair bowel preparation.  RECOMMENDATION:  Proceed with planned surgical resection by Dr. Milus Mallick. DD:  05/16/00 TD:  05/16/00 Job: [id number]

## 2011-01-25 NOTE — Cardiovascular Report (Signed)
NAME:  BELL, CAI                     ACCOUNT NO.:  [id number]   MEDICAL RECORD NO.:  [id number]                   PATIENT TYPE:  INP   LOCATION:  2907                                 FACILITY:  [facility]   PHYSICIAN:  Darlin Priestly, M.D.             DATE OF BIRTH:  September 21, 1931   DATE OF PROCEDURE:  10/11/2002  DATE OF DISCHARGE:                              CARDIAC CATHETERIZATION   PROCEDURES:  1. Left heart catheterization.  2. Coronary angiography.  3. Left ventriculogram.   COMPLICATIONS:  None.   INDICATIONS:  The patient is a 77 year old male, patient of Dr. Lenise Herald and Dr. Felipa Eth with a history of back pain, history of ETOH use,  history of CAD with noncritical disease in December of 2000 and the patient  is known to have an anomalous left circumflex takeoff.  The patient was  readmitted on October 04, 2002, with increasing shortness of breath.  He is  now referred for repeat catheterization.   DESCRIPTION OF PROCEDURE:  After given informed written consent, the patient  was brought to the cardiac catheterization lab where his right and left  groins were shaved, prepped, and draped in the usual sterile fashion.  ECG  monitoring was established.  Using modified Seldinger technique, a #6 French  arterial sheath was inserted in the right femoral artery.  A 6 French  diagnostic catheter was then used to perform diagnostic angiography.  This  reveals a separate ostium for the LAD and a left circumflex, which arises  from the right coronary cusp.   The LAD is a long vessel which coursed to the apex, is a large vessel and  gave rise to two diagonal branches.  The LAD has no significant disease.  The first diagonal is a medium sized vessel with a 60% ostial lesion.  The  second diagonal with no significant disease.   The left circumflex is a medium sized vessel which originates from the left  coronary cusp.  It gave rise to two obtuse marginal branches.  The  left  circumflex is noted to have 50% proximal 60% mid vessel stenosis.  The first  OM is a small vessel with no significant disease.  The second OM is a medium  sized vessel with 60% proximal stenosis.   The right coronary artery is a large vessel which is dominant and gives rise  to both the PDA as well as the posterolateral branch.  The RCA is noted to  have 50% proximal and 50% distal stenosis.  The PDA and posterolateral  branches are large vessels with no significant disease.   LEFT VENTRICULOGRAM:  The left ventriculogram reveals a mildly depressed EF  at 45-50% with mild global hypokinesis.   HEMODYNAMICS:  Systemic arterial pressure 144/67, LV systemic pressure  140/8, LVEDP of 12.    CONCLUSIONS:  1. Noncritical coronary artery disease.  2. 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  3. Mildly depressed left ventricular systolic function.                                                 Darlin Priestly, M.D.    RHM/MEDQ  D:  10/11/2002  T:  10/11/2002  Job:  [id number]

## 2011-01-25 NOTE — Cardiovascular Report (Signed)
[place]  Patient:    Raymond Chaney                   MRN: [id number] Proc. Date: 08/22/99 Adm. Date:  [id number] Attending:  Lenise Herald H CC:         Juluis Mire, M.D.                        Cardiac Catheterization  PROCEDURES: 1. Left heart catheterization. 2. Coronary angiography. 3. Left ventriculogram.  COMPLICATIONS:  None.  INDICATIONS:  [name] is a 77 year old white male patient of Dr. Earline Mayotte and Dr. Lenise Herald with a history of hypertension, ETOH use, hyperlipidemia, obesity, who was seen in our office originally in October with a complaint of increasing dyspnea on exertion and occasional PND for the last six months.  He ad a remote smoking history.  He subsequently underwent a dobutamine Cardiolite July 09, 1999, which revealed inferior septal scar with peri-infarct reversibility and low normal EF.  He is now scheduled for cardiac catheterization.  DESCRIPTION OF PROCEDURE:  After given informed written consent, the patient was brought to the cardiac catheterization lab where his right and left groins were  shaved, prepped, and draped in the usual sterile fashion.  ECG monitoring was established.  Using modified Seldinger technique, a #6 French arterial sheath was inserted into the right femoral artery without complications.  A 6 French diagnostic catheter was then used to perform diagnostic angiography.  This revealed a medium sized left main with no significant disease.  The left main gave rise to the LAD which was a medium sized vessel which coursed in the apex and gave rise to three diagonal branches.  The LAD proper had no significant disease.  The first diagonal was a medium sized vessel with a 50% ostial stenosis.  The second and third diagonals were small vessels with no significant disease.  The left circumflex rose from a separate ostium in the right coronary cusp as an innominate  vessel.  The circumflex was noted to  have 50% ostial with 50% proximal stenotic lesion.  It did give rise to one small obtuse marginal with no significant disease.  The second OM was a medium sized vessel with a 50% proximal stenosis.   The right coronary artery was a large vessel which was dominant and gave rise to both the PDA as well as the posterolateral branch.  The RCA was noted to have a 20% mid vessel stenotic lesion.  Both the PDA as well as the posterolateral branch ad no significant disease.  LEFT VENTRICULOGRAM:  The left ventriculogram revealed estimated EF of 60% with no regional wall motion abnormalities.  There is no significant MR.  HEMODYNAMICS:  Systemic blood pressure 142/72. LV systemic pressure 142/10, LVEDP of 22.  CONCLUSIONS: 1. No significant coronary artery disease. 2. 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 3. Normal left ventricular systolic function.  IMPRESSION:  [name] has noncritical coronary artery disease which we will continue to manage on a medical basis.  I think he may benefit from increasing is Toprol for better antianginal control as well as better blood pressure control. I plan to see him back in the office in approximately one to two weeks. DD:  08/22/99 TD:  08/22/99 Job: [id number]

## 2011-01-25 NOTE — Consult Note (Signed)
NAME:  Raymond Chaney, Raymond Chaney                     ACCOUNT NO.:  [id number]   MEDICAL RECORD NO.:  [id number]                   PATIENT TYPE:  INP   LOCATION:  0372                                 FACILITY:  [facility]   PHYSICIAN:  Maree Krabbe, M.D.             DATE OF BIRTH:  1932/05/16   DATE OF CONSULTATION:  12/16/2003  DATE OF DISCHARGE:                                   CONSULTATION   HISTORY:  The patient is a 77 year old white male followed by Dr. Felipa Eth with  no past renal history, history of asthma, CHF, and osteoarthritis, was  admitted for an elective left total knee replacement by Dr. Lequita Halt on November 30, 2003.  This was done without significant problems.  He was on Avapro,  Lasix, potassium, and Plavix prior to surgery.  The hospitalization has been  complicated initially by delirium within 48 hours felt to be due to alcohol  withdrawal.  It turned out that the patient had been a heavy regular drinker  which was not appreciated preoperatively.  He then developed a fever and  felt to have aspiration pneumonia with a right basilar infiltrate, and also  had a gout flare in his right great toe.  He was treated with Indocin,  started at least 10 days prior to this date, as well as antibiotics, and  initially Keflex, and subsequently Avelox.  He developed some shortness of  breath and wheezing and his baseline Lasix of 20 mg a day was increased up  to I think a maximum of 60 to 80 mg b.i.d.  His shortness of breath had  improved and the Lasix dose was then decreased.  Indocin was changed to  Mobic, a cox-2 inhibitor.  His creatinine on December 12, 2003, was still normal  at 1.  There was a three day lab holiday, and on December 15, 2003, the  creatinine was up to 3.2, which was yesterday, and today BUN 16, creatinine  3.4.   PAST MEDICAL HISTORY:  1. Osteoarthritis.  2. Asthma.  3. CHF.  4. Gout.  5. GI bleed related to peptic ulcer disease.  6. Hypothyroid.  7. Hypertension.  8.  Paroxysmal atrial fibrillation.  9. Remote stroke.  10.      Non-critical coronary artery disease.  11.      History of a burr hole procedure and partial colectomy in the past.  12.      Hip abscess with I&D.   SOCIAL HISTORY:  He is married with three grown children.  Quit smoking 12  years ago.  Smoked 50 years prior to that.  He drinks 1/5 of liquor daily.   FAMILY HISTORY:  Brother with diabetes and another with coronary artery  disease, another with pancreatic cancer.  Mother died at age 26, father died  at age 79 with diabetes.  No history of kidney failure in the family.   HOME MEDICATIONS:  1. Plavix.  2.  Potassium.  3. Avapro.  4. Lasix 20 mg daily.   CURRENT MEDICATIONS:  1. Colace.  2. Avapro 150 mg daily.  3. Multivitamin.  4. Thiamine.  5. Folic acid.  6. Potassium chloride.  7. Lopressor.  8. Protonix.  9. Scheduled Valium.  10.      Ventolin.  11.      Atrovent.  12.      Duragesic patch 25 q.72h.  13.      Depakote 125 mg b.i.d.  14.      Cardizem 120 mg daily.  15.      Senokot p.r.n.  16.      Reglan p.r.n.  17.      Percocet p.r.n.  18.      Phenergan p.r.n.  19.      Robaxin p.r.n.  20.      Benadryl p.r.n.  21.      Ventolin p.r.n.  22.      Ativan p.r.n.  His last dose of Ativan was yesterday evening at 9     p.m.   REVIEW OF SYSTEMS:  The patient is not able to communicate currently.   PHYSICAL EXAMINATION:  VITAL SIGNS:  Blood pressure 110/70, heart rate 70  and regular, respirations are 18 and unlabored, temperature 97.7.  GENERAL:  This is an obese, heavy set white male who is confused.  He is in  soft restraints.  He is moaning and moderately agitated.  SKIN:  Without rash.  HEENT:  PERRLA.  EOMI.  Throat is moist and clear.  NECK:  Supple with flat neck veins.  CHEST:  Difficult to auscultate due to the patient's agitation.  I do not  hear any gross wheezing or rales.  HEART:  Regular rate and rhythm without murmurs, rubs, or  gallops.  ABDOMEN:  Markedly obese, soft, nontender, with active bowel sounds.  GENITOURINARY:  There is a Foley catheter in place draining amber colored  urine in small amounts.  EXTREMITIES:  There is no peripheral edema.  There is a swollen ecchymotic  area over the left great toe which is not erythematous.  The left knee has a  long scar which is well-healed.  There is no distal cyanosis or stigmata of  emboli.  NEUROLOGIC:  The patient moving all four extremities equally.  His eyes are  closed.  His is moaning unintelligible moans.  He does squeeze my hand on  both sides in response to a verbal command.   LABORATORY DATA:  Labs from today:  Sodium 146, potassium 6, BUN 60,  creatinine 3.4, hemoglobin 11, white blood cell count 22,000, platelets  740,000.  Urinalysis showed 11 to 20 white blood cells, 7 to 10 red blood  cells, 30 protein.  Previous urinalysis showed 0 protein and no white blood  cells or red blood cells.  Last BNP done a week ago was 90.  Liver enzymes  have not been appreciably elevated.  During the hospitalization INR has been  therapeutically over anticoagulated with an INR of 4.2 yesterday.  Albumin  3.7 initially, dropped to 2.4.  Chest x-ray about a week ago showed a right  basilar infiltrate which was spotty.  Today's x-ray shows clearing of the  infiltrate with no CHF.   IMPRESSION:  1. Acute renal failure related to pre-renal ____________diuresis, as well as     effects of angiotensin receptor blocker and gout medications (initially     NSAID, and subsequently a cox-2 inhibitor).  2. Volume status.  The patient has no volume overload on examination or x-     ray today.  3. Delirium, multi-factorial.  Possible hypoxia currently contributing and     possibly septic.  Also consider medication side effects.  He is on     scheduled Valium, Duragesic, and receiving p.r.n. Ativan, as well as     Depakote. 4. Status post left total knee replacement.  5. Gout  flare in the right foot.  6. Right lower lobe pneumonia which appears to have cleared up.  7. History of hypertension, currently on diltiazem and a beta blocker and an     ERB.  8. Hyperkalemia.   RECOMMENDATIONS:  1. Discontinue ARB.  2. Discontinue KCL.  3. Avoid all nephrotoxins, NSAIDS, cox inhibitors, ACE inhibitors, ARB, and     IV contrast.  4. Discontinue all potentially sedating medications and transfer to     intensive care unit.  5. Agree with intravenous fluids, but decrease to 65 cc per hour now.  6. Check ABG.                                               Maree Krabbe, M.D.    RDS/MEDQ  D:  12/16/2003  T:  12/17/2003  Job:  [id number]   cc:   Larina Earthly, M.D.  [address]  [place]  [place] [address]  Fax: [phone]

## 2011-01-25 NOTE — Op Note (Signed)
[facility]  Patient:    Raymond Chaney, Raymond Chaney                  MRN: [id number] Proc. Date: 05/16/00 Adm. Date:  [id number] Disc. Date: [id number] Attending:  Starr Sinclair CC:         Venita Lick. Pleas Koch., M.D. [facility] R. Felipa Eth, M.D.   Operative Report  [facility] [place] [phone].  PREOPERATIVE DIAGNOSIS:  Polyp of uncertain pathology of descending colon.  POSTOPERATIVE DIAGNOSIS:  Polyp of uncertain pathology of descending colon.  OPERATION:  Left colectomy.  SURGEON:  Milus Mallick, M.D.  ASSISTANT:  Abigail Miyamoto, M.D.  HISTORY:  This 77 year old male patient had undergone a colonoscopy and polypectomy for a large sessile polyp of the descending colon by Dr. Claudette Head.  Dr. Russella Dar was unable to retrieve the specimen colonoscopically.  The patient was instructed to strain his stools.  However, the polyp was lost by the patient at home, and therefore there was no pathology available.  The patient underwent a bowel prep a day prior to surgery and on the day of surgery, he underwent a follow-up colonoscopy by Dr. Claudette Head.  The area of the polyp resection was tattooed with [place] ink, and then the patient was brought to the operating room.  DESCRIPTION OF PROCEDURE:  Under adequate general endotracheal anesthesia and after an epidural catheter was inserted, the patients abdomen was prepared and draped in the usual fashion.  A midline incision was made from the superior epigastrium down to the midhypogastrium, going around the right side of the umbilicus.  Bleeders were electrocoagulated.  Upon entering the peritoneal cavity, small bowel appeared normal.  The stomach and duodenum were normal, as was the gallbladder.  The right colon and transverse colon was also normal.  There was diverticulosis of the sigmoid colon.  The area of the previous polyp was noted in the descending colon by virtue of the blue stain of the [place] ink.   The patient was extremely deep, and his splenic flexure of his colon went up posterior to the spleen and was attached to the splenic capsule.  A Thompson self-retaining retractor was inserted as well as a Social research officer, government.  We had great difficulty with exposure of the splenic flexure of the colon, of which it was necessary to take down in order to resect the polyp-bearing colon.  Approximately 1 hour 15 minutes was spent taking down the splenic flexure of the colon.  This was done over large ligaclips and also Vanderbilt clamps that were ligated with 2-0 silk.  The attachments of the spleen were divided over large ligaclips, and the gastrocolic omentum was divided over Kelly clamps, which were ligated with 2-0 silk.  The white line was incised with Bovie electrocoagulation along the descending colon, and eventually the splenic flexure was completely mobilized and was delivered into the main incision. The area of blue dye was noted, and the colon was resected approximately 4 or 5 cm distal to the dye over the TLC 75 mm stapler.  The mesentery of the descending colon was then serially divided over Kelly clamps, which were ligated with 2-0 silk.  The proximal line of resection was in the distal transverse colon and was also done with the TLC 75 mm stapler.  The specimen was removed from the operative field and opened on the back table.  The lesion in question was within the specimen.  It was then sent for routine pathologic study.  An end-to-end anastomosis was then carried out between the distal transverse colon and the proximal sigmoid colon using a TA60 Ethicon stapler with 3.5 mm staples in a triangulation method.  Holding sutures of 3-0 silk were used.  A very well-patent anastomosis was created without any defects.  The defect in the mesentery was closed with interrupted sutures of 2-0 silk.  Hemostasis was ascertained.  The abdomen was closed with continuous suture of #2 Prolene on the  midline fascia.  The skin was approximated with generic staples 35-W, and a sterile dressing was applied.  Estimated blood loss for the procedure was approximately 500 cc.  The patient tolerated the procedure well and left the operating room in satisfactory condition. DD:  05/16/00 TD:  05/19/00 Job: [id number]

## 2011-01-25 NOTE — Discharge Summary (Signed)
NAME:  Raymond Chaney, Raymond Chaney                     ACCOUNT NO.:  [id number]   MEDICAL RECORD NO.:  [id number]                   PATIENT TYPE:  INP   LOCATION:  2907                                 FACILITY:  [facility]   PHYSICIAN:  Raymond Chaney, M.D.                  DATE OF BIRTH:  05-30-1932   DATE OF ADMISSION:  10/04/2002  DATE OF DISCHARGE:  10/14/2002                                 DISCHARGE SUMMARY   DISCHARGE DIAGNOSES:  1. Congestive heart failure, resolved.  2. Mild left ventricular dysfunction.  3. Paroxysmal atrial fibrillation.  No Coumadin.  Aspirin and Plavix only     secondary to #4.  4. ETOH use.  Ativan protocol completed here in the hospital.  5. Back pain with orthopedic consult.  6. Coronary artery disease with nonobstructive disease.  7. Dyslipidemia.  8. Hypokalemia, resolved.   DISCHARGE CONDITION:  Improved.   PROCEDURES:  On 10/11/02, left heart catheterization by Dr. Lenise Chaney.   DISCHARGE MEDICATIONS:  1. Claritin 10 mg daily.  2. Protonix 40 mg daily.  3. Enteric-coated aspirin 81 mg daily.  4. Plavix 75 mg daily.  5. Combivent inhaler 2 puffs 2-3 times a day.  6. Betapace 80 mg twice a day.  7. Avapro 150 mg daily.  8. Lasix 40 mg twice a day.  9. K-Dur 20 mEq twice a day.  10.      Percocet 7.5/325, 1-2 tablets q.4-6h. as needed for pain.  11.      Robaxin 500 mg one q.6h. as needed for muscle spasm.  12.      Multivitamin daily.   DISCHARGE INSTRUCTIONS:  1. For back pain, use Percocet and Robaxin.  2. Activity as tolerated.  3. Low-fat, low-salt, low-sugar diet.  4. Wash cath site with soap and water.  Call if any bleeding, swelling, or     drainage.  5. Have blood work done Monday morning.  6. No alcohol.  7. Followup with Dr. Jenne Chaney on 10/28/02 at 2:30 p.m.  8. Followup with Dr. Shelle Chaney after you see Dr. Jenne Chaney.   HISTORY OF PRESENT ILLNESS:  A 77 year old white married male with history  of cardiac work-up 3-4 years ago.  Over the  last four weeks, he has had  increased shortness of breath all the time, increased with any exertion.  No  problem sleeping, though routinely sleeps on 2-3 pillows.  No chest pain.  He came into the emergency room today for increased shortness of breath.   PAST MEDICAL HISTORY:  1. Cardiac history of echocardiogram and a catheterization in the past.  2. Back pain with recent injection for disk and osteoarthritis.  3. Knee pain; uses brace.  4. CVA three years ago.   ALLERGIES:  PENICILLIN.   OUTPATIENT MEDICATIONS:  Only taking hydrocodone.  Questionably taking  Norvasc 5 mg daily and half of a blue pill for his heart.   FAMILY HISTORY/SOCIAL HISTORY/  REVIEW OF SYSTEMS:  See H&P.   PHYSICAL EXAMINATION AT DISCHARGE:  VITAL SIGNS:  Blood pressure 114/50,  pulse 62, respirations 20, temperature 98.  Room air oxygen saturation 93%.  GENERAL:  Alert, oriented male in no acute distress.  HEART:  S1 and S2.  Regular rate and rhythm.  LUNGS:  Fairly clear.  ABDOMEN:  Positive bowel sounds.  EXTREMITIES:  Without edema.   LABORATORY DATA:  Hemoglobin 13.1, hematocrit 38.3, WBCs 10.8, MCV 110,  platelets 151.  Prior to discharge WBCs were 9.6, hemoglobin 13.3,  hematocrit 39, MCV 107, and platelets 473.  Protime 13.2, INR of 1, PTT 30.  Heparin is therapeutic.  Initial laboratory  values revealed a chemistry with a sodium of 137, potassium 2.5, chloride  98, glucose 157, BUN 14; on recheck 2.5 was true for potassium.  The CO2 was  35, glucose 122, BUN 12, creatinine 0.7, calcium 7.5.  Potassium did come up  during hospitalization prior to discharge.  Sodium 135, potassium 4.6,  chloride 92, CO2 of 34, glucose 106, BUN 23, creatinine 0.9, and calcium  9.9.  Magnesium was 1.2, and it was replaced prior to discharge at 2.1.  AST  20, ALT 13, alkaline phosphatase 79, total bilirubin 1.0.  Glycohemoglobin  was 5.8.  Cardiac enzymes ranged from 36, 31, and 30.  MB of 0.6, 1.4, and  1.1.   Troponin I was all negative at 0.04.  The initial BNP was 316.  The  second BNP was 241, and it climbed, and on 10/12/02, it peaked at 1920,  despite treatment and improvement in patient's condition.  Lipid panel  revealed cholesterol of 166, triglycerides 201, HDL 44, and LDL 82.  TSH  2.575.  Alcohol level on admission was less than 5.   Chest x-ray on admission revealed no active disease.  Followup on 10/08/02  had congestive heart failure with mild interstitial edema.  On 10/12/02,  minimal atelectasis at the left lung base.  Resolution of the pulmonary  vascular congestion on 10/13/02.  Mild scarring or atelectasis in the left  lower lobe, and mild pleural scarring in the left, versus a small amount of  pleural fluid.  EKG on arrival to the emergency room revealed atrial  fibrillation with rapid ventricular response.  Heart rate 171.  Possible  anterior infarction, age undetermined.  Marked ST abnormality.  Follow up on  10/05/02 revealed sinus rhythm rate of 82 with PACs.  On 10/06/02, sinus  rhythm.  Cannot rule out anterior myocardial infarction.  On 10/10/02, sinus  rhythm, premature beats.   Cardiac catheterization on 10/11/02 revealed 50% proximal and mid RCA  stenosis, left circumflex with 50-60%, and LAD diagonal 60%.  EF was 40-50.   HOSPITAL COURSE:  Mr. Raymond Chaney was admitted on 10/04/02 by Dr. Tresa Chaney in rapid  atrial fibrillation.  He was given IV Cardizem with improvement of his heart  rate.  Potassium was 2.5, and that was replenished.  He was given two  boluses of 20 mg of Cardizem and triples of 10.  A beta blocker was also  added.  Magnesium had improved by the next morning.  Potassium was still  low, and that was replenished.  A 2-D echo was ordered and done.  I do not  have those results in the chart currently.   BNP was climbing.  The patient was transferred to the ICU and placed on Natrecor drip with Lasix in addition.  He began diuresing and began feeling  better.  On his 2-D  echo, he had poor widows.   By 10/11/02, he underwent cardiac catheterization with results as above.  He  continued to improve.  He was not a Coumadin candidate by his paroxysmal  atrial fibrillation on admission.  He was maintained on aspirin and Plavix.  Dr. Shelle Chaney was consulted due to patient's significant back pain, and the  patient continued to improve.  The Natrecor was discontinued on 10/13/02, and  by 10/14/02 he was stable and ready to be discharged home after ambulating.  He participated with cardiac rehab, but did not believe he would do phase II  at this point.  Assistance through the Medical Assistance Program was  obtained for his medications.  He was having difficulty paying for his  medicines, and he had quit taking some prior to admission for that reason.  He will follow up as an outpatient.     Darcella Gasman. [place], N.P.                     Raymond Chaney, M.D.    LRI/MEDQ  D:  12/14/2002  T:  12/16/2002  Job:  [id number]   cc:   Larina Earthly, M.D.  [address]  [place]  [place] [address]  Fax: [phone]   Jene Every, M.D.  [address]  [place]  [place] [address]  Fax: [phone]

## 2011-01-25 NOTE — Consult Note (Signed)
NAME:  Raymond Chaney, Raymond Chaney                     ACCOUNT NO.:  [id number]   MEDICAL RECORD NO.:  [id number]                   PATIENT TYPE:  INP   LOCATION:  2908                                 FACILITY:  [facility]   PHYSICIAN:  Graylin Shiver, M.D.                DATE OF BIRTH:  10/03/1931   DATE OF CONSULTATION:  01/05/2004  DATE OF DISCHARGE:                                   CONSULTATION   REASON FOR CONSULTATION:  This patient is a 77 year old male who was  admitted on November 30, 2003, for an elective left total knee replacement.  The patient developed DT's postoperatively due to ETOH withdrawal.  The  patient then developed fever and was felt to have possible aspiration  pneumonia with bilateral basilar infiltrates.  He then developed abdominal  distention and abdominal x-rays showed findings consistent with paralytic  ileus.  The patient has had an NG tube in, but today he pulled it out.  He  also had a rectal pouch on to retain the stool, and today it was noted that  there was some blood in it.  The patient does have a history of  diverticulosis of the colon, and had a left hemicolectomy in 2001.   LABORATORY DATA:  His white blood cell count today is 12.6, hemoglobin and  hematocrit 10.4 and 30.6.  Recent amylase and lipase were normal.  Today  electrolytes were a sodium of 139, potassium 3.8, chloride 104, CO2 31.   PAST MEDICAL HISTORY:  1. Osteoarthritis.  2. Asthma.  3. CHF.  4. Gout.  5. Diverticulosis.  6. History of PUD.  7. Hypothyroidism.  8. Hypertension.  9. Paroxysmal atrial fibrillation.  10.      Remote stroke.  11.      Coronary artery disease.  12.      Partial left hemicolectomy in the past due to diverticulosis in     2001.   CURRENT MEDICATIONS:  Reviewed from his hospital chart and include:  1. Miconazole.  2. Sliding scale insulin.  3. Atrovent.  4. Albuterol.  5. Lantus insulin.  6. KCL.  7. Furosemide.  8. Lopressor.  9. Flagyl.  10.       Lovenox.  11.      Cipro.  12.      Tylenol.  13.      Haldol.  14.      Senokot.   PHYSICAL EXAMINATION:  VITAL SIGNS:  Stable.  GENERAL:  He does not appear in any acute distress.  He is not offering much  history.  HEART:  Regular rhythm, no murmurs are heard.  LUNGS:  Coarse breath sounds bilaterally.  ABDOMEN:  Bowel sounds are present, but soft and nontender.   IMPRESSION:  1. Paralytic ileus.  This probably has a multi-factorial cause, including     the patient being postoperative, immobile, slightly decreased potassium,     and having pneumonia.  2.  Rectal bleeding in a patient with a history of diverticulosis.   PLAN:  I would recommend treating the primary medical problems that he has  and the postoperative ileus should eventually resolve.  At the present time,  I would observe the gastrointestinal bleeding which seemed to start today.  There is noted a little blood in the rectal bag.  I would check his  hemoglobin and hematocrit.  If bleeding continues, we will need to consider  stopping Lovenox, and it may be necessary to do further testing such as  bleeding scan and/or possible flexible sigmoidoscopy or colonoscopy.  However, I think that looking back at his records he had a colonoscopy in  2001, with findings of diverticulosis.  I would elect at this time not to  instrument the patient unless intervention to stop bleeding was required.                                               Graylin Shiver, M.D.    Germain Osgood  D:  01/05/2004  T:  01/05/2004  Job:  [id number]   cc:   Maree Krabbe, M.D.  Fax: [phone]   Ollen Gross, M.D.  [facility]  [address]  [address]  [place]  [place] [address]  Fax: [phone]   Larina Earthly, M.D.  [address]  [place]  [place] [address]  Fax: [phone]

## 2011-06-21 LAB — POCT I-STAT 3, ART BLOOD GAS (G3+)
Acid-Base Excess: 1
Bicarbonate: 26.6 — ABNORMAL HIGH
O2 Saturation: 91
Operator id: 233621
TCO2: 28
pCO2 arterial: 45.4 — ABNORMAL HIGH
pH, Arterial: 7.377
pO2, Arterial: 62 — ABNORMAL LOW

## 2012-09-08 ENCOUNTER — Emergency Department (HOSPITAL_COMMUNITY): Payer: Medicare Other

## 2012-09-08 ENCOUNTER — Encounter (HOSPITAL_COMMUNITY): Payer: Self-pay | Admitting: Emergency Medicine

## 2012-09-08 ENCOUNTER — Inpatient Hospital Stay (HOSPITAL_COMMUNITY)
Admission: EM | Admit: 2012-09-08 | Discharge: 2012-09-12 | DRG: 728 | Disposition: A | Discharge status: Home / Self Care | Payer: Medicare Other | Attending: Internal Medicine | Admitting: Internal Medicine

## 2012-09-08 DIAGNOSIS — M109 Gout, unspecified: Secondary | ICD-10-CM | POA: Diagnosis present

## 2012-09-08 DIAGNOSIS — N453 Epididymo-orchitis: Principal | ICD-10-CM

## 2012-09-08 DIAGNOSIS — E669 Obesity, unspecified: Secondary | ICD-10-CM

## 2012-09-08 DIAGNOSIS — Z7901 Long term (current) use of anticoagulants: Secondary | ICD-10-CM

## 2012-09-08 DIAGNOSIS — R079 Chest pain, unspecified: Secondary | ICD-10-CM

## 2012-09-08 DIAGNOSIS — J449 Chronic obstructive pulmonary disease, unspecified: Secondary | ICD-10-CM | POA: Diagnosis present

## 2012-09-08 DIAGNOSIS — N390 Urinary tract infection, site not specified: Secondary | ICD-10-CM

## 2012-09-08 DIAGNOSIS — K5900 Constipation, unspecified: Secondary | ICD-10-CM | POA: Diagnosis not present

## 2012-09-08 DIAGNOSIS — I5042 Chronic combined systolic (congestive) and diastolic (congestive) heart failure: Secondary | ICD-10-CM | POA: Diagnosis present

## 2012-09-08 DIAGNOSIS — I4891 Unspecified atrial fibrillation: Secondary | ICD-10-CM | POA: Diagnosis present

## 2012-09-08 DIAGNOSIS — E119 Type 2 diabetes mellitus without complications: Secondary | ICD-10-CM | POA: Diagnosis present

## 2012-09-08 DIAGNOSIS — Z96659 Presence of unspecified artificial knee joint: Secondary | ICD-10-CM

## 2012-09-08 DIAGNOSIS — I10 Essential (primary) hypertension: Secondary | ICD-10-CM | POA: Diagnosis present

## 2012-09-08 DIAGNOSIS — N451 Epididymitis: Secondary | ICD-10-CM

## 2012-09-08 DIAGNOSIS — I509 Heart failure, unspecified: Secondary | ICD-10-CM | POA: Diagnosis present

## 2012-09-08 DIAGNOSIS — Z87891 Personal history of nicotine dependence: Secondary | ICD-10-CM

## 2012-09-08 DIAGNOSIS — R0789 Other chest pain: Secondary | ICD-10-CM

## 2012-09-08 DIAGNOSIS — Z8673 Personal history of transient ischemic attack (TIA), and cerebral infarction without residual deficits: Secondary | ICD-10-CM

## 2012-09-08 DIAGNOSIS — E875 Hyperkalemia: Secondary | ICD-10-CM | POA: Diagnosis not present

## 2012-09-08 DIAGNOSIS — G4730 Sleep apnea, unspecified: Secondary | ICD-10-CM

## 2012-09-08 DIAGNOSIS — F1010 Alcohol abuse, uncomplicated: Secondary | ICD-10-CM | POA: Diagnosis present

## 2012-09-08 DIAGNOSIS — Z86718 Personal history of other venous thrombosis and embolism: Secondary | ICD-10-CM

## 2012-09-08 DIAGNOSIS — Z6836 Body mass index (BMI) 36.0-36.9, adult: Secondary | ICD-10-CM

## 2012-09-08 DIAGNOSIS — J4489 Other specified chronic obstructive pulmonary disease: Secondary | ICD-10-CM | POA: Diagnosis present

## 2012-09-08 DIAGNOSIS — N3000 Acute cystitis without hematuria: Secondary | ICD-10-CM | POA: Diagnosis present

## 2012-09-08 DIAGNOSIS — E1169 Type 2 diabetes mellitus with other specified complication: Secondary | ICD-10-CM

## 2012-09-08 HISTORY — DX: Gastrointestinal hemorrhage, unspecified: K92.2

## 2012-09-08 HISTORY — DX: Unspecified atrial fibrillation: I48.91

## 2012-09-08 HISTORY — DX: Heart failure, unspecified: I50.9

## 2012-09-08 HISTORY — DX: Cerebral infarction, unspecified: I63.9

## 2012-09-08 HISTORY — DX: Gout, unspecified: M10.9

## 2012-09-08 HISTORY — DX: Shortness of breath: R06.02

## 2012-09-08 HISTORY — DX: Adverse effect of unspecified anesthetic, initial encounter: T41.45XA

## 2012-09-08 HISTORY — DX: Chronic obstructive pulmonary disease, unspecified: J44.9

## 2012-09-08 HISTORY — DX: Sleep apnea, unspecified: G47.30

## 2012-09-08 HISTORY — DX: Essential (primary) hypertension: I10

## 2012-09-08 HISTORY — DX: Alcohol abuse, uncomplicated: F10.10

## 2012-09-08 HISTORY — DX: Unspecified osteoarthritis, unspecified site: M19.90

## 2012-09-08 HISTORY — DX: Acute embolism and thrombosis of unspecified deep veins of unspecified lower extremity: I82.409

## 2012-09-08 LAB — URINALYSIS, ROUTINE W REFLEX MICROSCOPIC
Bilirubin Urine: NEGATIVE
Glucose, UA: 100 mg/dL — AB
Hgb urine dipstick: NEGATIVE
Ketones, ur: NEGATIVE mg/dL
Protein, ur: NEGATIVE mg/dL
pH: 5 (ref 5.0–8.0)

## 2012-09-08 LAB — CBC
HCT: 40.3 % (ref 39.0–52.0)
Hemoglobin: 13.4 g/dL (ref 13.0–17.0)
MCH: 31.8 pg (ref 26.0–34.0)
MCHC: 33.3 g/dL (ref 30.0–36.0)
RDW: 13.2 % (ref 11.5–15.5)

## 2012-09-08 LAB — BASIC METABOLIC PANEL
BUN: 14 mg/dL (ref 6–23)
Calcium: 9.2 mg/dL (ref 8.4–10.5)
GFR calc Af Amer: >90 mL/min (ref >90)
GFR calc non Af Amer: 84 mL/min — ABNORMAL LOW (ref >90)
Glucose, Bld: 200 mg/dL — ABNORMAL HIGH (ref 70–99)

## 2012-09-08 LAB — URINE MICROSCOPIC-ADD ON

## 2012-09-08 LAB — GLUCOSE, CAPILLARY: Glucose-Capillary: 177 mg/dL — ABNORMAL HIGH (ref 70–99)

## 2012-09-08 LAB — TROPONIN I: Troponin I: <0.3 ng/mL (ref <0.30)

## 2012-09-08 LAB — PROTIME-INR: INR: 2.99 — ABNORMAL HIGH (ref 0.00–1.49)

## 2012-09-08 MED ORDER — METOPROLOL TARTRATE 25 MG PO TABS
25.0000 mg | ORAL_TABLET | Freq: Every day | ORAL | Status: DC
  Administered 2012-09-09: 25 mg via ORAL
  Filled 2012-09-08: qty 1

## 2012-09-08 MED ORDER — DOXAZOSIN MESYLATE 4 MG PO TABS
4.0000 mg | ORAL_TABLET | Freq: Every day | ORAL | Status: DC
  Administered 2012-09-09 – 2012-09-12 (×4): 4 mg via ORAL
  Filled 2012-09-08 (×4): qty 1

## 2012-09-08 MED ORDER — HYDROCODONE-ACETAMINOPHEN 5-325 MG PO TABS
1.0000 | ORAL_TABLET | ORAL | Status: DC | PRN
  Administered 2012-09-09: 2 via ORAL
  Filled 2012-09-08: qty 2

## 2012-09-08 MED ORDER — WARFARIN - PHARMACIST DOSING INPATIENT
Freq: Every day | Status: DC
  Administered 2012-09-09: 18:00:00

## 2012-09-08 MED ORDER — ONDANSETRON HCL 4 MG/2ML IJ SOLN: 4.0000 mg | Freq: Four times a day (QID) | INTRAMUSCULAR | Status: DC | PRN

## 2012-09-08 MED ORDER — SIMVASTATIN 40 MG PO TABS
40.0000 mg | ORAL_TABLET | Freq: Every day | ORAL | Status: DC
  Filled 2012-09-08: qty 1

## 2012-09-08 MED ORDER — DOCUSATE SODIUM 100 MG PO CAPS
100.0000 mg | ORAL_CAPSULE | Freq: Two times a day (BID) | ORAL | Status: DC
  Administered 2012-09-08 – 2012-09-10 (×4): 100 mg via ORAL
  Filled 2012-09-08 (×5): qty 1

## 2012-09-08 MED ORDER — ONDANSETRON HCL 4 MG PO TABS: 4.0000 mg | ORAL_TABLET | Freq: Four times a day (QID) | ORAL | Status: DC | PRN

## 2012-09-08 MED ORDER — MORPHINE SULFATE 2 MG/ML IJ SOLN
2.0000 mg | INTRAMUSCULAR | Status: DC | PRN
  Administered 2012-09-08: 2 mg via INTRAVENOUS
  Filled 2012-09-08: qty 1

## 2012-09-08 MED ORDER — ALBUTEROL SULFATE (5 MG/ML) 0.5% IN NEBU
5.0000 mg | INHALATION_SOLUTION | Freq: Four times a day (QID) | RESPIRATORY_TRACT | Status: DC | PRN
Start: 2012-09-08 — End: 2012-09-12
  Administered 2012-09-08: 2.5 mg via RESPIRATORY_TRACT
  Filled 2012-09-08: qty 0.5

## 2012-09-08 MED ORDER — MORPHINE SULFATE 4 MG/ML IJ SOLN
4.0000 mg | Freq: Once | INTRAMUSCULAR | Status: AC
  Administered 2012-09-08: 4 mg via INTRAVENOUS
  Filled 2012-09-08: qty 1

## 2012-09-08 MED ORDER — ALUM & MAG HYDROXIDE-SIMETH 200-200-20 MG/5ML PO SUSP: 30.0000 mL | Freq: Four times a day (QID) | ORAL | Status: DC | PRN

## 2012-09-08 MED ORDER — SODIUM CHLORIDE 0.9 % IJ SOLN
3.0000 mL | Freq: Two times a day (BID) | INTRAMUSCULAR | Status: DC
  Administered 2012-09-09 – 2012-09-12 (×5): 3 mL via INTRAVENOUS

## 2012-09-08 MED ORDER — ONDANSETRON HCL 4 MG/2ML IJ SOLN
4.0000 mg | Freq: Once | INTRAMUSCULAR | Status: AC
  Administered 2012-09-08: 4 mg via INTRAVENOUS
  Filled 2012-09-08: qty 2

## 2012-09-08 MED ORDER — DILTIAZEM HCL ER 240 MG PO CP24
240.0000 mg | ORAL_CAPSULE | Freq: Every day | ORAL | Status: DC
  Administered 2012-09-09: 240 mg via ORAL
  Filled 2012-09-08: qty 1

## 2012-09-08 MED ORDER — ACETAMINOPHEN 325 MG PO TABS
650.0000 mg | ORAL_TABLET | Freq: Four times a day (QID) | ORAL | Status: DC | PRN
  Administered 2012-09-08: 650 mg via ORAL
  Filled 2012-09-08: qty 2

## 2012-09-08 MED ORDER — GABAPENTIN 300 MG PO CAPS
300.0000 mg | ORAL_CAPSULE | Freq: Every day | ORAL | Status: DC
  Administered 2012-09-08 – 2012-09-11 (×4): 300 mg via ORAL
  Filled 2012-09-08 (×5): qty 1

## 2012-09-08 MED ORDER — ALLOPURINOL 100 MG PO TABS
100.0000 mg | ORAL_TABLET | Freq: Every day | ORAL | Status: DC
  Administered 2012-09-09 – 2012-09-12 (×4): 100 mg via ORAL
  Filled 2012-09-08 (×4): qty 1

## 2012-09-08 MED ORDER — WARFARIN SODIUM 5 MG PO TABS
5.0000 mg | ORAL_TABLET | Freq: Every day | ORAL | Status: DC
  Administered 2012-09-09 – 2012-09-11 (×3): 5 mg via ORAL
  Filled 2012-09-08 (×4): qty 1

## 2012-09-08 MED ORDER — SODIUM CHLORIDE 0.9 % IV SOLN: 250.0000 mL | INTRAVENOUS | Status: DC | PRN

## 2012-09-08 MED ORDER — LISINOPRIL 10 MG PO TABS
10.0000 mg | ORAL_TABLET | Freq: Every day | ORAL | Status: DC
  Administered 2012-09-09 – 2012-09-12 (×4): 10 mg via ORAL
  Filled 2012-09-08 (×4): qty 1

## 2012-09-08 MED ORDER — LEVOFLOXACIN IN D5W 500 MG/100ML IV SOLN
500.0000 mg | INTRAVENOUS | Status: DC
  Administered 2012-09-08: 500 mg via INTRAVENOUS
  Filled 2012-09-08 (×2): qty 100

## 2012-09-08 MED ORDER — IPRATROPIUM BROMIDE 0.02 % IN SOLN
0.5000 mg | Freq: Four times a day (QID) | RESPIRATORY_TRACT | Status: DC | PRN
  Administered 2012-09-08: 0.5 mg via RESPIRATORY_TRACT
  Filled 2012-09-08: qty 2.5

## 2012-09-08 MED ORDER — ACETAMINOPHEN 650 MG RE SUPP: 650.0000 mg | Freq: Four times a day (QID) | RECTAL | Status: DC | PRN

## 2012-09-08 MED ORDER — ZOLPIDEM TARTRATE 5 MG PO TABS: 5.0000 mg | ORAL_TABLET | Freq: Once | ORAL | Status: DC

## 2012-09-08 MED ORDER — DEXTROSE 5 % IV SOLN
1.0000 g | Freq: Once | INTRAVENOUS | Status: AC
  Administered 2012-09-08: 1 g via INTRAVENOUS
  Filled 2012-09-08: qty 10

## 2012-09-08 MED ORDER — INSULIN ASPART 100 UNIT/ML ~~LOC~~ SOLN
0.0000 [IU] | Freq: Three times a day (TID) | SUBCUTANEOUS | Status: DC
  Administered 2012-09-09 – 2012-09-11 (×3): 1 [IU] via SUBCUTANEOUS

## 2012-09-08 MED ORDER — SODIUM CHLORIDE 0.9 % IJ SOLN
3.0000 mL | Freq: Two times a day (BID) | INTRAMUSCULAR | Status: DC
  Administered 2012-09-09 – 2012-09-12 (×4): 3 mL via INTRAVENOUS

## 2012-09-08 MED ORDER — SODIUM CHLORIDE 0.9 % IJ SOLN: 3.0000 mL | INTRAMUSCULAR | Status: DC | PRN

## 2012-09-08 NOTE — Progress Notes (Signed)
180transferred in from ED via stretcher

## 2012-09-08 NOTE — ED Notes (Signed)
Pt. Unable to ambulate in the hallway due to pain

## 2012-09-08 NOTE — Progress Notes (Signed)
ANTICOAGULATION CONSULT NOTE - Initial Consult  Pharmacy Consult for Coumadin Indication: atrial fibrillation  Allergies  Allergen Reactions  . Penicillins     Patient Measurements: Height: 6' (182.9 cm) Weight: 268 lb 3.2 oz (121.655 kg) IBW/kg (Calculated) : 77.6  Heparin Dosing Weight:   Vital Signs: Temp: 100.1 F (37.8 C) (12/31 1819) Temp src: Oral (12/31 1819) BP: 118/46 mmHg (12/31 1819) Pulse Rate: 81  (12/31 1819)  Labs:  Basename 09/08/12 1215  HGB 13.4  HCT 40.3  PLT 196  APTT --  LABPROT --  INR --  HEPARINUNFRC --  CREATININE 0.79  CKTOTAL --  CKMB --  TROPONINI --    Estimated Creatinine Clearance: 102.5 ml/min (by C-G formula based on Cr of 0.79).   Medical History: Past Medical History  Diagnosis Date  . Arthritis   . CHF (congestive heart failure)   . COPD (chronic obstructive pulmonary disease)   . Diabetes mellitus without complication   . Hypertension   . Stroke   . Gout   . Alcohol abuse   . GIB (gastrointestinal bleeding)   . Complication of anesthesia     2004   stopped breathing  3 times  on the table "  . Shortness of breath   . Sleep apnea 09/08/2012    Newly diagnosed  . Atrial fibrillation   . DVT, lower extremity      2005    Medications:  Prescriptions prior to admission  Medication Sig Dispense Refill  . allopurinol (ZYLOPRIM) 100 MG tablet Take 100 mg by mouth daily.      Marland Kitchen diltiazem (DILACOR XR) 240 MG 24 hr capsule Take 240 mg by mouth daily.      Marland Kitchen doxazosin (CARDURA) 4 MG tablet Take 4 mg by mouth daily.      Marland Kitchen gabapentin (NEURONTIN) 300 MG capsule Take 300 mg by mouth at bedtime.      Marland Kitchen lisinopril (PRINIVIL,ZESTRIL) 10 MG tablet Take 10 mg by mouth daily.      . metFORMIN (GLUCOPHAGE) 1000 MG tablet Take 1,000 mg by mouth daily with breakfast.      . metoprolol tartrate (LOPRESSOR) 25 MG tablet Take 25 mg by mouth daily.      . potassium chloride SA (K-DUR,KLOR-CON) 20 MEQ tablet Take 20 mEq by mouth  daily.      . pravastatin (PRAVACHOL) 80 MG tablet Take 80 mg by mouth daily.      Marland Kitchen warfarin (COUMADIN) 5 MG tablet Take 5 mg by mouth every morning.        Assessment: 80yom continuing Coumadin for Afib. Admit INR (2.99) is therapeutic. Patient reports PTA Coumadin regimen is 5mg  daily - took last dose this AM. - H/H and Plts wnl - No significant bleeding reported per patient  Goal of Therapy:  INR 2-3   Plan:  1. No coumadin tonight - continue 5mg  daily starting on 1/1 2. Daily INR  Cleon Dew [phone] 09/08/2012,7:58 PM

## 2012-09-08 NOTE — ED Notes (Signed)
Patient claims that he has been having recurrent intermittent chest pain L side.  Patient claims that he was SOB.  Patient denies nausea.  Patient denies radiation of pain.

## 2012-09-08 NOTE — ED Provider Notes (Signed)
History     CSN: [id number]  Arrival date & time 09/08/12  1201   First MD Initiated Contact with Patient 09/08/12 1306      Chief Complaint  Patient presents with  . Chest Pain    (Consider location/radiation/quality/duration/timing/severity/associated sxs/prior treatment) HPI Comments: Raymond Chaney is a 78 y.o. Male who began having sharp, left-sided chest pain yesterday. He has also had right testicular pain, since yesterday. He denies dysuria, urinary frequency, cough, shortness of breath, and fever, or chills. He saw his doctor, last week, for a checkup. Last night. He had a sleep study done for diagnosis of possible sleep apnea. He does not know the result of that test or the planned treatment as a result of that test. There are no modifying factors  Patient is a 78 y.o. male presenting with chest pain. The history is provided by the patient.  Chest Pain     Past Medical History  Diagnosis Date  . Arthritis   . CHF (congestive heart failure)   . COPD (chronic obstructive pulmonary disease)   . Diabetes mellitus without complication   . Hypertension   . Stroke     Past Surgical History  Procedure Date  . Total knee arthroplasty   . Colon surgery   . Hip surgery     No family history on file.  History  Substance Use Topics  . Smoking status: Former Games developer  . Smokeless tobacco: Not on file  . Alcohol Use: No      Review of Systems  Cardiovascular: Positive for chest pain.  All other systems reviewed and are negative.    Allergies  Review of patient's allergies indicates no known allergies.  Home Medications   Current Outpatient Rx  Name  Route  Sig  Dispense  Refill  . ALLOPURINOL 100 MG PO TABS   Oral   Take 100 mg by mouth daily.         Marland Kitchen DILTIAZEM HCL ER 240 MG PO CP24   Oral   Take 240 mg by mouth daily.         Marland Kitchen DOXAZOSIN MESYLATE 4 MG PO TABS   Oral   Take 4 mg by mouth daily.         Marland Kitchen GABAPENTIN 300 MG PO CAPS  Oral   Take 300 mg by mouth at bedtime.         Marland Kitchen LISINOPRIL 10 MG PO TABS   Oral   Take 10 mg by mouth daily.         Marland Kitchen METFORMIN HCL 1000 MG PO TABS   Oral   Take 1,000 mg by mouth daily with breakfast.         . METOPROLOL TARTRATE 25 MG PO TABS   Oral   Take 25 mg by mouth daily.         Marland Kitchen POTASSIUM CHLORIDE CRYS ER 20 MEQ PO TBCR   Oral   Take 20 mEq by mouth daily.         Marland Kitchen PRAVASTATIN SODIUM 80 MG PO TABS   Oral   Take 80 mg by mouth daily.         . WARFARIN SODIUM 5 MG PO TABS   Oral   Take 5 mg by mouth every morning.           BP 100/71  Pulse 116  Temp 99.3 F (37.4 C) (Oral)  Resp 23  Ht 6' (1.829 m)  Wt 275 lb (124.739  kg)  BMI 37.30 kg/m2  SpO2 92%  Physical Exam  Nursing note and vitals reviewed. Constitutional: He is oriented to person, place, and time. He appears well-developed.       Elderly, frail, obese  HENT:  Head: Normocephalic and atraumatic.  Right Ear: External ear normal.  Left Ear: External ear normal.  Eyes: Conjunctivae normal and EOM are normal. Pupils are equal, round, and reactive to light.  Neck: Normal range of motion and phonation normal. Neck supple.  Cardiovascular: Normal rate, regular rhythm, normal heart sounds and intact distal pulses.   Pulmonary/Chest: Effort normal and breath sounds normal. He has no wheezes. He has no rales. He exhibits no bony tenderness.  Abdominal: Soft. Normal appearance. There is no tenderness.  Genitourinary:       Penis is uncircumcised, there is no urethral discharge. There is no phimosis. The right hemiscrotum is red, and. The right testicle is swollen, and tender. There is no associated groin hernia or lymphadenopathy  Musculoskeletal: Normal range of motion.  Neurological: He is alert and oriented to person, place, and time. He has normal strength. No cranial nerve deficit or sensory deficit. He exhibits normal muscle tone. Coordination normal.  Skin: Skin is warm, dry and  intact.  Psychiatric: He has a normal mood and affect. His behavior is normal. Judgment and thought content normal.    ED Course  Procedures (including critical care time)  Emergency department treatment: IV bolus, and drip. IV, Rocephin.   Reevaluation:16:05- orthostatics were done and show increased heart rate, but no lowering of the blood pressure going from supine to standing. Blood pressure is somewhat soft. The patient stated, he could not walk because of severe pain in the right testicle upon standing. Additionally he, states he has similar problem with the right testicle, 2 weeks ago, but improved spontaneously. He has been able to tolerate some oral fluids, here today.    Date: 06/26/2012  Rate: 82  Rhythm: atrial fibrillation  QRS Axis: normal  PR and QT Intervals: Normal QT  ST/T Wave abnormalities: normal  PR and QRS Conduction Disutrbances:Normal QT  Narrative Interpretation: Poor R wave  Old EKG Reviewed: unchanged     Labs Reviewed  CBC - Abnormal; Notable for the following:    WBC 17.9 (*)  WHITE COUNT CONFIRMED ON SMEAR   All other components within normal limits  BASIC METABOLIC PANEL - Abnormal; Notable for the following:    Potassium 5.2 (*)  HEMOLYSIS AT THIS LEVEL MAY AFFECT RESULT   Glucose, Bld 200 (*)     GFR calc non Af Amer 84 (*)     All other components within normal limits  URINALYSIS, ROUTINE W REFLEX MICROSCOPIC - Abnormal; Notable for the following:    APPearance CLOUDY (*)     Glucose, UA 100 (*)     Leukocytes, UA LARGE (*)     All other components within normal limits  POCT I-STAT TROPONIN I  URINE MICROSCOPIC-ADD ON  URINE CULTURE   Dg Chest Portable 1 View  09/08/2012  *RADIOLOGY REPORT*  Clinical Data: Chest pain.  PORTABLE CHEST - 1 VIEW  Comparison: 01/09/2004.  Findings: The heart is mildly enlarged but stable.  The mediastinal and hilar contours are stable.  Minimal streaky bibasilar atelectasis but no infiltrates, edema or  effusions.  The bony thorax is intact.  IMPRESSION: Streaky bibasilar atelectasis but no definite infiltrates or effusions.   Original Report Authenticated By: Rudie Meyer, M.D.      1.  Epididymitis   2. Nonspecific chest pain       MDM  Nonspecific chest pain, doubt ACS, or pneumonia. Apparent epididymitis. Patient is unable to walk due to pain. You need to be admitted for further treatment with parenteral medications and IV fluids.       Flint Melter, MD 09/09/12 [phone]

## 2012-09-08 NOTE — ED Notes (Signed)
Care transferred and report given to Alan, RN  

## 2012-09-08 NOTE — H&P (Signed)
History and Physical  Raymond Chaney ZOX:[id number] DOB: November 27, 1931 DOA: 09/08/2012  Referring physician: Brynda Greathouse, MD PCP: Hoyle Sauer, MD Cardiologist: Nanetta Batty, MD  Chief Complaint: Testicle pain  HPI:  78 year old man presented emergency department with 2 complaints today: Burning chest pain and right testicular pain. Workup was suggestive of urinary tract infection and epididymitis the patient was referred for admission.  Patient developed chest pain last night approximately 8 PM. This was described as a burning, tingling sensation it has been constant without aggravating or alleviating factors extending into today. No shortness of breath with this, no diaphoresis, no nausea, vomiting, left arm or jaw pain.  His main complaint is right testicular pain which developed last night as well and has become more and more painful today. No aggravating or alleviating factors were noted. The right testicle is quite tender to touch. No injury. The scrotum itself is nontender and nonswollen. He denies any dysuria. He has had no previous genitourinary issues that he knows of. He does report having had some testicular pain a few days ago which he states spontaneously resolved.  In the emergency department he was noted to be afebrile with normal pulse and mildly elevated respiratory rate. Oxygen saturation normal. Borderline hypotension noted. Basic metabolic panel is unremarkable with slight hemolysis and mildly elevated potassium. Blood cell count 17.9. Urinalysis grossly positive. Chest x-ray was negative. EKG revealed atrial fibrillation no acute changes.   Review of Systems:  Negative for fever, visual changes, sore throat, rash, new muscle aches, SOB, dysuria, bleeding, n/v/abdominal pain.  Past Medical History  Diagnosis Date  . Arthritis   . CHF (congestive heart failure)   . COPD (chronic obstructive pulmonary disease)   . Diabetes mellitus without complication   .  Hypertension   . Stroke     Past Surgical History  Procedure Date  . Total knee arthroplasty   . Colon surgery   . Hip surgery     Social History:  reports that he has quit smoking. He does not have any smokeless tobacco history on file. He reports that he does not drink alcohol or use illicit drugs.  No Known Allergies  Family History  Problem Relation Age of Onset  . Heart attack Father   . Diabetes Father      Prior to Admission medications   Medication Sig Start Date End Date Taking? Authorizing Provider  allopurinol (ZYLOPRIM) 100 MG tablet Take 100 mg by mouth daily.   Yes Historical Provider, MD  diltiazem (DILACOR XR) 240 MG 24 hr capsule Take 240 mg by mouth daily.   Yes Historical Provider, MD  doxazosin (CARDURA) 4 MG tablet Take 4 mg by mouth daily.   Yes Historical Provider, MD  gabapentin (NEURONTIN) 300 MG capsule Take 300 mg by mouth at bedtime.   Yes Historical Provider, MD  lisinopril (PRINIVIL,ZESTRIL) 10 MG tablet Take 10 mg by mouth daily.   Yes Historical Provider, MD  metFORMIN (GLUCOPHAGE) 1000 MG tablet Take 1,000 mg by mouth daily with breakfast.   Yes Historical Provider, MD  metoprolol tartrate (LOPRESSOR) 25 MG tablet Take 25 mg by mouth daily.   Yes Historical Provider, MD  potassium chloride SA (K-DUR,KLOR-CON) 20 MEQ tablet Take 20 mEq by mouth daily.   Yes Historical Provider, MD  pravastatin (PRAVACHOL) 80 MG tablet Take 80 mg by mouth daily.   Yes Historical Provider, MD  warfarin (COUMADIN) 5 MG tablet Take 5 mg by mouth every morning.   Yes Historical Provider, MD  Physical Exam: Filed Vitals:   09/08/12 1230 09/08/12 1553 09/08/12 1556 09/08/12 1559  BP: 121/52 96/53 105/48 100/71  Pulse: 88 98 94 116  Temp:  99.3 F (37.4 C) 99.2 F (37.3 C) 99.3 F (37.4 C)  TempSrc:  Oral Oral Oral  Resp: 17 19 22 23   Height:      Weight:      SpO2: 99% 98% 96% 92%    General:  Appears calm and comfortable until he has to move at which point  it is clear he has pain in the groin area Eyes: PERRL, normal lids, irises  ENT: grossly normal hearing, lips & tongue Neck: no LAD, masses or thyromegaly Cardiovascular: RRR, no m/r/g. No LE edema. Respiratory: CTA bilaterally, no w/r/r. Normal respiratory effort. Abdomen: soft, ntnd Genitourinary: Groin appears generally unremarkable. Uncircumcised penis appears normal. The scrotum appears unremarkable without erythema or edema. The scrotum is nontender to palpation, notably no tenderness on the right side of the scrotum. There is no tenderness of the left testicle. The right testicle is exquisitely tender, especially superiorly. The perineum appears normal without erythema. Buttocks appear remarkable. Musculoskeletal: grossly normal tone BUE/BLE Psychiatric: grossly normal mood and affect, speech fluent and appropriate Neurologic: grossly non-focal.  Wt Readings from Last 3 Encounters:  09/08/12 124.739 kg (275 lb)   Labs on Admission:  Basic Metabolic Panel:  Lab 09/08/12 1610  NA 140  K 5.2*  CL 103  CO2 23  GLUCOSE 200*  BUN 14  CREATININE 0.79  CALCIUM 9.2  MG --  PHOS --     CBC:  Lab 09/08/12 1215  WBC 17.9*  NEUTROABS --  HGB 13.4  HCT 40.3  MCV 95.5  PLT 196     Basename 09/08/12 1227  TROPIPOC 0.00    Radiological Exams on Admission: Dg Chest Portable 1 View  09/08/2012  *RADIOLOGY REPORT*  Clinical Data: Chest pain.  PORTABLE CHEST - 1 VIEW  Comparison: 01/09/2004.  Findings: The heart is mildly enlarged but stable.  The mediastinal and hilar contours are stable.  Minimal streaky bibasilar atelectasis but no infiltrates, edema or effusions.  The bony thorax is intact.  IMPRESSION: Streaky bibasilar atelectasis but no definite infiltrates or effusions.   Original Report Authenticated By: Rudie Meyer, M.D.     EKG: Independently reviewed. As above   Active Problems:  * No active hospital problems. *    Assessment/Plan 1. Epididymitis: History,  clinical findings and laboratory studies most suggestive of epididymitis. No evidence of abscess on exam. Will obtain ultrasound but doubt tortion. Empiric antibiotic therapy. Urine culture. Denies recent sexual contact. 2. UTI: Urine culture. Empiric antibiotics as above. 3. Atypical chest pain: No signs or symptoms to suggest acute coronary syndrome. Pain present more than 12 hours with negative troponin and reassuring EKG. Serial cardiac enzymes. If negative  Would not pursue further evaluation. Heart catheterization in 04/2007 at that time had noncritical coronary artery disease.  4. Atrial fibrillation: Rate controlled. Continue warfarin, diltiazem and metoprolol.  5. Diabetes mellitus: Sliding scale insulin. Resume metformin on discharge. 6. History of Alcohol use: Previous hospitalization notable for severe delirium tremens. Patient denies use now. Monitor for withdrawal.   Code Status: Full code Family Communication: None present Disposition Plan/Anticipated LOS: 2-3 days.  Time spent: 55 minutes  Brendia Sacks, MD  [facility] Team 4  Pager [phone] If 7PM-7AM, please contact night-coverage at [email or web address], password [facility] 09/08/2012, 4:19 PM

## 2012-09-09 ENCOUNTER — Inpatient Hospital Stay (HOSPITAL_COMMUNITY): Payer: Medicare Other

## 2012-09-09 LAB — GLUCOSE, CAPILLARY: Glucose-Capillary: 110 mg/dL — ABNORMAL HIGH (ref 70–99)

## 2012-09-09 LAB — BASIC METABOLIC PANEL
BUN: 18 mg/dL (ref 6–23)
Chloride: 105 mEq/L (ref 96–112)
GFR calc Af Amer: >90 mL/min (ref >90)
GFR calc non Af Amer: 78 mL/min — ABNORMAL LOW (ref >90)
Glucose, Bld: 109 mg/dL — ABNORMAL HIGH (ref 70–99)
Potassium: 4.7 mEq/L (ref 3.5–5.1)
Sodium: 142 mEq/L (ref 135–145)

## 2012-09-09 LAB — URINE CULTURE: Colony Count: 8000

## 2012-09-09 LAB — PROTIME-INR: Prothrombin Time: 30.2 seconds — ABNORMAL HIGH (ref 11.6–15.2)

## 2012-09-09 LAB — CBC
HCT: 36 % — ABNORMAL LOW (ref 39.0–52.0)
Hemoglobin: 11.9 g/dL — ABNORMAL LOW (ref 13.0–17.0)
MCH: 32.2 pg (ref 26.0–34.0)
MCHC: 33.1 g/dL (ref 30.0–36.0)
RBC: 3.7 MIL/uL — ABNORMAL LOW (ref 4.22–5.81)

## 2012-09-09 LAB — TROPONIN I: Troponin I: <0.3 ng/mL (ref <0.30)

## 2012-09-09 MED ORDER — CHLORDIAZEPOXIDE HCL 5 MG PO CAPS
10.0000 mg | ORAL_CAPSULE | Freq: Three times a day (TID) | ORAL | Status: DC
  Administered 2012-09-09 – 2012-09-11 (×8): 10 mg via ORAL
  Filled 2012-09-09 (×8): qty 2

## 2012-09-09 MED ORDER — METOPROLOL TARTRATE 1 MG/ML IV SOLN: 5.0000 mg | INTRAVENOUS | Status: DC | PRN

## 2012-09-09 MED ORDER — LEVOFLOXACIN IN D5W 500 MG/100ML IV SOLN
500.0000 mg | INTRAVENOUS | Status: DC
  Administered 2012-09-09 – 2012-09-10 (×2): 500 mg via INTRAVENOUS
  Filled 2012-09-09 (×3): qty 100

## 2012-09-09 MED ORDER — FOLIC ACID 1 MG PO TABS
1.0000 mg | ORAL_TABLET | Freq: Every day | ORAL | Status: DC
  Administered 2012-09-09 – 2012-09-12 (×4): 1 mg via ORAL
  Filled 2012-09-09 (×4): qty 1

## 2012-09-09 MED ORDER — CHLORDIAZEPOXIDE HCL 10 MG PO CAPS: 10.0000 mg | ORAL_CAPSULE | Freq: Three times a day (TID) | ORAL | Status: DC

## 2012-09-09 MED ORDER — ADULT MULTIVITAMIN W/MINERALS CH
1.0000 | ORAL_TABLET | Freq: Every day | ORAL | Status: DC
  Administered 2012-09-09 – 2012-09-12 (×4): 1 via ORAL
  Filled 2012-09-09 (×4): qty 1

## 2012-09-09 MED ORDER — SENNA 8.6 MG PO TABS
2.0000 | ORAL_TABLET | Freq: Once | ORAL | Status: DC
  Filled 2012-09-09: qty 2

## 2012-09-09 MED ORDER — VITAMIN B-1 100 MG PO TABS
100.0000 mg | ORAL_TABLET | Freq: Every day | ORAL | Status: DC
  Administered 2012-09-09 – 2012-09-12 (×4): 100 mg via ORAL
  Filled 2012-09-09 (×4): qty 1

## 2012-09-09 MED ORDER — ATORVASTATIN CALCIUM 20 MG PO TABS
20.0000 mg | ORAL_TABLET | Freq: Every day | ORAL | Status: DC
  Administered 2012-09-09 – 2012-09-11 (×3): 20 mg via ORAL
  Filled 2012-09-09 (×5): qty 1

## 2012-09-09 MED ORDER — CIPROFLOXACIN IN D5W 400 MG/200ML IV SOLN: 400.0000 mg | Freq: Two times a day (BID) | INTRAVENOUS | Status: DC

## 2012-09-09 MED ORDER — LORAZEPAM 2 MG/ML IJ SOLN: 1.0000 mg | Freq: Four times a day (QID) | INTRAMUSCULAR | Status: AC | PRN

## 2012-09-09 MED ORDER — ZOLPIDEM TARTRATE 5 MG PO TABS
5.0000 mg | ORAL_TABLET | Freq: Once | ORAL | Status: AC
  Administered 2012-09-09: 5 mg via ORAL
  Filled 2012-09-09: qty 1

## 2012-09-09 MED ORDER — THIAMINE HCL 100 MG/ML IJ SOLN
100.0000 mg | Freq: Every day | INTRAMUSCULAR | Status: DC
  Filled 2012-09-09 (×4): qty 1

## 2012-09-09 MED ORDER — SODIUM CHLORIDE 0.9 % IV SOLN: 250.0000 mL | INTRAVENOUS | Status: DC | PRN

## 2012-09-09 MED ORDER — LORAZEPAM 0.5 MG PO TABS: 1.0000 mg | ORAL_TABLET | Freq: Four times a day (QID) | ORAL | Status: AC | PRN

## 2012-09-09 MED ORDER — SODIUM CHLORIDE 0.9 % IV SOLN
250.0000 mL | INTRAVENOUS | Status: DC
  Administered 2012-09-09: 250 mL via INTRAVENOUS

## 2012-09-09 NOTE — Progress Notes (Signed)
ANTIBIOTIC CONSULT NOTE - INITIAL  Pharmacy Consult for Levaquin Indication: Acute epididymoorchitis  Allergies  Allergen Reactions  . Penicillins    Patient Measurements: Height: 6' (182.9 cm) Weight: 270 lb 6.4 oz (122.653 kg) (bed scale; pt complains of swelling; unable to stand) IBW/kg (Calculated) : 77.6   Vital Signs: Temp: 97.7 F (36.5 C) (01/01 0543) Temp src: Oral (01/01 0543) BP: 131/51 mmHg (01/01 1038) Pulse Rate: 103  (01/01 1038) Intake/Output from previous day: 12/31 0701 - 01/01 0700 In: 100 [IV Piggyback:100] Out: 100 [Urine:100] Intake/Output from this shift: Total I/O In: 243 [P.O.:240; I.V.:3] Out: -   Labs:  Basename 09/09/12 0450 09/08/12 1215  WBC 20.0* 17.9*  HGB 11.9* 13.4  PLT 152 196  LABCREA -- --  CREATININE 0.95 0.79   Estimated Creatinine Clearance: 86.7 ml/min (by C-G formula based on Cr of 0.95). No results found for this basename: VANCOTROUGH:2,VANCOPEAK:2,VANCORANDOM:2,GENTTROUGH:2,GENTPEAK:2,GENTRANDOM:2,TOBRATROUGH:2,TOBRAPEAK:2,TOBRARND:2,AMIKACINPEAK:2,AMIKACINTROU:2,AMIKACIN:2, in the last 72 hours   Microbiology: No results found for this or any previous visit (from the past 720 hour(s)).  Medical History: Past Medical History  Diagnosis Date  . Arthritis   . CHF (congestive heart failure)   . COPD (chronic obstructive pulmonary disease)   . Diabetes mellitus without complication   . Hypertension   . Stroke   . Gout   . Alcohol abuse   . GIB (gastrointestinal bleeding)   . Complication of anesthesia     2004   stopped breathing  3 times  on the table "  . Shortness of breath   . Sleep apnea 09/08/2012    Newly diagnosed  . Atrial fibrillation   . DVT, lower extremity      2005    Assessment: 79 y/o M with acute epididymoorchitis with no evidence of abscess formation per MD note. Also with probable UTI, awaiting culture results. WBC 20, Tmax 102.5, Scr 0.95, CrCl ~ 85-90.   Goal of Therapy:  Eradication of  infection  Plan:  - Levaquin 500mg  IV q24h - Trend temp, WBC, renal function - F/U micro data from urine, blood  Abran Duke, PharmD Clinical Pharmacist Phone: [phone] Pager: [phone] 09/09/2012 1:22 PM

## 2012-09-09 NOTE — Consult Note (Signed)
Urology Consult  Referring physician: P. Thedore Mins Reason for referral: right testicular pain  History of Present Illness: Raymond Chaney is a 79 year old male who seen in hospital consultation today for further evaluation of probable right epididymoorchitis. He reports that he has never had a UTI were epididymoorchitis in the past. 4-5 days ago he said he began to have pain in his right testicle and it was tender to touch. He said the pain eventually resolved spontaneously but then recurred again 2-3 days ago and became progressively worse. This was associated with swelling of the right testicle. He denied any changes in his voiding pattern and specifically denied dysuria, increased urgency, frequency or hematuria. He did report a slight increase in nocturia from 1 to 2 times. He said that the pain was aggravated by patch and sewed it was severe enough that even his underwear touching his scrotum was uncomfortable.  He was admitted for this as well as complaints of chest pain.  Past Medical History  Diagnosis Date  . Arthritis   . CHF (congestive heart failure)   . COPD (chronic obstructive pulmonary disease)   . Diabetes mellitus without complication   . Hypertension   . Stroke   . Gout   . Alcohol abuse   . GIB (gastrointestinal bleeding)   . Complication of anesthesia     2004   stopped breathing  3 times  on the table "  . Shortness of breath   . Sleep apnea 09/08/2012    Newly diagnosed  . Atrial fibrillation   . DVT, lower extremity      2005   Past Surgical History  Procedure Date  . Total knee arthroplasty   . Colon surgery     colon polyps  . Hip surgery     Medications:  Scheduled:   . allopurinol  100 mg Oral Daily  . diltiazem  240 mg Oral Daily  . docusate sodium  100 mg Oral BID  . doxazosin  4 mg Oral Daily  . gabapentin  300 mg Oral QHS  . insulin aspart  0-9 Units Subcutaneous TID WC  . levofloxacin (LEVAQUIN) IV  500 mg Intravenous Q24H  . lisinopril  10 mg  Oral Daily  . metoprolol tartrate  25 mg Oral Daily  . simvastatin  40 mg Oral q1800  . sodium chloride  3 mL Intravenous Q12H  . sodium chloride  3 mL Intravenous Q12H  . warfarin  5 mg Oral q1800  . Warfarin - Pharmacist Dosing Inpatient   Does not apply q1800  . zolpidem  5 mg Oral Once   Continuous:  YNW:GNFAOZ chloride, acetaminophen, acetaminophen, albuterol, alum & mag hydroxide-simeth, HYDROcodone-acetaminophen, ipratropium, morphine injection, ondansetron (ZOFRAN) IV, ondansetron, sodium chloride Anti-infectives     Start     Dose/Rate Route Frequency Ordered Stop   09/08/12 2000   levofloxacin (LEVAQUIN) IVPB 500 mg        500 mg 100 mL/hr over 60 Minutes Intravenous Every 24 hours 09/08/12 1902     09/08/12 1400   cefTRIAXone (ROCEPHIN) 1 g in dextrose 5 % 50 mL IVPB        1 g 100 mL/hr over 30 Minutes Intravenous  Once 09/08/12 1352 09/08/12 1650          Allergies:  Allergies  Allergen Reactions  . Penicillins     Family History  Problem Relation Age of Onset  . Heart attack Father   . Diabetes Father     Social History:  reports that he quit smoking about 28 years ago. He has quit using smokeless tobacco. He reports that he does not drink alcohol or use illicit drugs.  Review of Systems: Pertinent items are noted in HPI. A comprehensive review of systems was negative except as noted above.  Physical Exam:  Vital signs in last 24 hours: Temp:  [97.7 F (36.5 C)-102.5 F (39.2 C)] 97.7 F (36.5 C) (01/01 0543) Pulse Rate:  [80-116] 103  (01/01 1038) Resp:  [17-24] 21  (01/01 0543) BP: (96-144)/(46-71) 131/51 mmHg (01/01 1038) SpO2:  [92 %-100 %] 100 % (01/01 0543) Weight:  [121.655 kg (268 lb 3.2 oz)-124.739 kg (275 lb)] 122.653 kg (270 lb 6.4 oz) (01/01 0543) General appearance: alert and appears stated age Head: Normocephalic, without obvious abnormality, atraumatic Eyes: conjunctivae/corneas clear. EOM's intact.  Oropharynx: moist mucous  membranes Neck: supple, symmetrical, trachea midline Resp: normal respiratory effort Cardio: regular rate and rhythm Back: symmetric, no curvature. ROM normal. No CVA tenderness. GI: his abdomen is obese, soft, non-tender; bowel sounds normal; no masses,  no organomegaly there is a healed midline scar infraumbilical and location  Male genitalia: penis: normal male phallus with no lesions or discharge.Testes: bilaterally descended. The left testicle is normal. The right testicle is tender with some obvious swelling of the testicle as well as associated epididymis. There was no palpable fluctuance. He clinically did not have any significant associated hydrocele to examination. The scrotal skin was free of erythema, induration or crepitus.  Extremities: extremities normal, atraumatic, no cyanosis or edema Skin: Skin color normal. No visible rashes or lesions Neurologic: Grossly normal  Laboratory Data:   Basename 09/09/12 0450 09/08/12 1215  WBC 20.0* 17.9*  HGB 11.9* 13.4  HCT 36.0* 40.3   BMET  Basename 09/09/12 0450 09/08/12 1215  NA 142 140  K 4.7 5.2*  CL 105 103  CO2 29 23  GLUCOSE 109* 200*  BUN 18 14  CREATININE 0.95 0.79  CALCIUM 8.8 9.2    Basename 09/09/12 0450 09/08/12 2045  LABPT -- --  INR 3.09* 2.99*   No results found for this basename: LABURIN:1 in the last 72 hours No results found for this or any previous visit. Creatinine:  Basename 09/09/12 0450 09/08/12 1215  CREATININE 0.95 0.79    Imaging: US Scrotum  09/09/2012  *RADIOLOGY REPORT*  Clinical Data:  Right testicular pain.  Suspected epididymitis. Rule out torsion.  SCROTAL ULTRASOUND DOPPLER ULTRASOUND OF THE TESTICLES  Technique: Complete ultrasound examination of the testicles, epididymis, and other scrotal structures was performed.  Color and spectral Doppler ultrasound were also utilized to evaluate blood flow to the testicles.  Comparison:  None  Findings:  Right testis:  5.3 x 3.9 x 4.3 cm.   Increased vascularity/color Doppler signal.  Normal gray scale appearance.  Left testis:  4.3 x 3.1 x 2.9 cm.  Dilatation of the rete testes is a normal variant laterally.  Normal gray scale and color Doppler appearance.  Right epididymis:  Tiny cyst versus spermatocele.  No definite increased vascularity.  Left epididymis:  Normal in size and appearance.  Hydrocele:  Right-sided small to moderate, with complexity.  Varicocele:  Absent  Pulsed Doppler interrogation of both testes demonstrates low resistance flow bilaterally.  IMPRESSION:  1.  Findings of right-sided orchitis. 2.  No evidence of testicular torsion. 3.  Mildly complex right-sided hydrocele, likely secondary.   Original Report Authenticated By: Jeronimo Greaves, M.D.    [place] Art/ven Flow Abd Pelv Doppler  09/09/2012  *  RADIOLOGY REPORT*  Clinical Data:  Right testicular pain.  Suspected epididymitis. Rule out torsion.  SCROTAL ULTRASOUND DOPPLER ULTRASOUND OF THE TESTICLES  Technique: Complete ultrasound examination of the testicles, epididymis, and other scrotal structures was performed.  Color and spectral Doppler ultrasound were also utilized to evaluate blood flow to the testicles.  Comparison:  None  Findings:  Right testis:  5.3 x 3.9 x 4.3 cm.  Increased vascularity/color Doppler signal.  Normal gray scale appearance.  Left testis:  4.3 x 3.1 x 2.9 cm.  Dilatation of the rete testes is a normal variant laterally.  Normal gray scale and color Doppler appearance.  Right epididymis:  Tiny cyst versus spermatocele.  No definite increased vascularity.  Left epididymis:  Normal in size and appearance.  Hydrocele:  Right-sided small to moderate, with complexity.  Varicocele:  Absent  Pulsed Doppler interrogation of both testes demonstrates low resistance flow bilaterally.  IMPRESSION:  1.  Findings of right-sided orchitis. 2.  No evidence of testicular torsion. 3.  Mildly complex right-sided hydrocele, likely secondary.   Original Report Authenticated By:  Jeronimo Greaves, M.D.    Dg Chest Portable 1 View  09/08/2012  *RADIOLOGY REPORT*  Clinical Data: Chest pain.  PORTABLE CHEST - 1 VIEW  Comparison: 01/09/2004.  Findings: The heart is mildly enlarged but stable.  The mediastinal and hilar contours are stable.  Minimal streaky bibasilar atelectasis but no infiltrates, edema or effusions.  The bony thorax is intact.  IMPRESSION: Streaky bibasilar atelectasis but no definite infiltrates or effusions.   Original Report Authenticated By: Rudie Meyer, M.D.     Impression/Assessment:  1. Acute epididymoorchitis: He has what appears to be an uncomplicated epididymoorchitis with no evidence of abscess formation by clinical exam or ultrasound. I did review his ultrasound images and agreed that he does have some swelling of the right testicle associated with some inflammation as well as inflammatory changes of the right epididymis. There is a small amount of reactive hydrocele fluid of no clinical significance. This should respond to appropriate antibiotic therapy based on his urine culture result and is undoubtedly secondary to what appears to be an acute cystitis by UA. I have counseled the patient about the fact that the swelling associated with epididymoorchitis may take up to 4-6 weeks to completely resolve however with antibiotic therapy the pain should resolve within the next few days indicating the clearance of active infection.  2.Probable UTI. His urinalysis revealed significant pyuria and almost certainly is infected. I noted a urine culture has been obtained. He has had no previous UTIs and really has no significant voiding symptoms. Further workup of this UTI will not be necessary but if he should continue to have recurrent UTIs then further urologic workup would be indicated.  Plan:  1. Agree with Levaquin while awaiting culture results. 2. Analgesics for pain control. 3.Treat for a full 7 day course of antibiotics based on urine culture results. 4.  Please contact me if further urologic assistance is needed in any way.  Doreen Garretson C 09/09/2012, 11:54 AM

## 2012-09-09 NOTE — Progress Notes (Signed)
Patient had a 2.3 second pause. Patient was asymptomatic at the time. No complaints and no signs or symptoms of distress or discomfort. BP is low at 105/30. MD notified and new orders given. Will continue to monitor patient for further changes in condition.

## 2012-09-09 NOTE — Progress Notes (Signed)
[facility]                                                                                Patient Demographics  Raymond Chaney, is a 79 y.o. male  ZOX:[id number]  [id number]  DOB - 1932/03/23  Admit date - 09/08/2012  Admitting Physician Standley Brooking, MD  Outpatient Primary MD for the patient is Hoyle Sauer, MD  LOS - 1   Chief Complaint  Patient presents with  . Chest Pain        Assessment & Plan     1. Right-sided orchitis: Continue IV Levaquin, appreciate urology input, monitor clinically. Will follow urine cultures.   2. UTI: Urine culture. Empiric antibiotics as above.   3. Atypical chest pain on admission: No signs or symptoms to suggest acute coronary syndrome. Pain present more than 12 hours with negative troponin and reassuring EKG. Serial cardiac enzymes are negative, Would not pursue further evaluation. Heart catheterization in 04/2007 at that time had noncritical coronary artery disease. Patient follows with his cardiologist.    4. Atrial fibrillation: Rate controlled. Continue warfarin, diltiazem and metoprolol.     5. Diabetes mellitus: Sliding scale insulin. Resume metformin on discharge.    6. History of Alcohol use: Previous hospitalization notable for severe delirium tremens. Place on scheduled Librium along with CIWA protocol.     Code Status: Full  Family Communication: Wife and patient  Disposition Plan: Home   Procedures Testicular ultrasound   Consults  urology   DVT Prophylaxis  Couamdin  Lab Results  Component Value Date   INR 3.09* 09/09/2012   INR 2.99* 09/08/2012     Lab Results  Component Value Date   PLT 152 09/09/2012    Medications  Scheduled Meds:   . allopurinol  100 mg Oral Daily  . ciprofloxacin  400 mg Intravenous Q12H  . diltiazem  240 mg Oral Daily  . docusate sodium  100 mg Oral BID  . doxazosin  4 mg Oral Daily  . gabapentin  300 mg Oral QHS  . insulin  aspart  0-9 Units Subcutaneous TID WC  . lisinopril  10 mg Oral Daily  . metoprolol tartrate  25 mg Oral Daily  . simvastatin  40 mg Oral q1800  . sodium chloride  3 mL Intravenous Q12H  . sodium chloride  3 mL Intravenous Q12H  . warfarin  5 mg Oral q1800  . Warfarin - Pharmacist Dosing Inpatient   Does not apply q1800  . zolpidem  5 mg Oral Once   Continuous Infusions:  PRN Meds:.sodium chloride, acetaminophen, albuterol, alum & mag hydroxide-simeth, HYDROcodone-acetaminophen, ipratropium, metoprolol, morphine injection, ondansetron (ZOFRAN) IV, sodium chloride  Antibiotics    Anti-infectives     Start     Dose/Rate Route Frequency Ordered Stop   09/09/12 1245   ciprofloxacin (CIPRO) IVPB 400 mg        400 mg 200 mL/hr over 60 Minutes Intravenous Every 12 hours 09/09/12 1237     09/08/12 2000   levofloxacin (LEVAQUIN) IVPB 500 mg  Status:  Discontinued        500 mg 100 mL/hr over 60 Minutes Intravenous Every 24 hours 09/08/12  1902 09/09/12 1237   09/08/12 1400   cefTRIAXone (ROCEPHIN) 1 g in dextrose 5 % 50 mL IVPB        1 g 100 mL/hr over 30 Minutes Intravenous  Once 09/08/12 1352 09/08/12 1650           Time Spent in minutes   35   Shaunette Gassner K M.D on 09/09/2012 at 12:38 PM  Between 7am to 7pm - Pager - [phone]  After 7pm go to [email or web address] - password [email or web address]  And look for the night coverage person covering for me after hours  [facility] Office  [phone]    Subjective:   [name] today has, No headache, No chest pain, No abdominal pain - No Nausea, No new weakness tingling or numbness, No Cough - SOB. Is having scrotal and right testicular pain  Objective:   Filed Vitals:   09/08/12 2300 09/09/12 0543 09/09/12 1037 09/09/12 1038  BP:  129/66 131/51 131/51  Pulse:  84  103  Temp: 99.1 F (37.3 C) 97.7 F (36.5 C)    TempSrc: Oral Oral    Resp:  21    Height:      Weight:  122.653 kg (270 lb 6.4 oz)    SpO2:  100%       Wt Readings from Last 3 Encounters:  09/09/12 122.653 kg (270 lb 6.4 oz)     Intake/Output Summary (Last 24 hours) at 09/09/12 1238 Last data filed at 09/09/12 1038  Gross per 24 hour  Intake    343 ml  Output    100 ml  Net    243 ml    Exam Awake Alert, Oriented X 3, No new F.N deficits, Normal affect [place].AT,PERRAL Supple Neck,No JVD, No cervical lymphadenopathy appriciated.  Symmetrical Chest wall movement, Good air movement bilaterally, CTAB RRR,No Gallops,Rubs or new Murmurs, No Parasternal Heave +ve B.Sounds, Abd Soft, Non tender, No organomegaly appriciated, No rebound - guarding or rigidity. No Cyanosis, Clubbing or edema, No new Rash or bruise, scrotum is swollen, with redness around his right testicle, right testicle is tender to touch.   Data Review   Micro Results No results found for this or any previous visit (from the past 240 hour(s)).  Radiology Reports US Scrotum  09/09/2012  *RADIOLOGY REPORT*  Clinical Data:  Right testicular pain.  Suspected epididymitis. Rule out torsion.  SCROTAL ULTRASOUND DOPPLER ULTRASOUND OF THE TESTICLES  Technique: Complete ultrasound examination of the testicles, epididymis, and other scrotal structures was performed.  Color and spectral Doppler ultrasound were also utilized to evaluate blood flow to the testicles.  Comparison:  None  Findings:  Right testis:  5.3 x 3.9 x 4.3 cm.  Increased vascularity/color Doppler signal.  Normal gray scale appearance.  Left testis:  4.3 x 3.1 x 2.9 cm.  Dilatation of the rete testes is a normal variant laterally.  Normal gray scale and color Doppler appearance.  Right epididymis:  Tiny cyst versus spermatocele.  No definite increased vascularity.  Left epididymis:  Normal in size and appearance.  Hydrocele:  Right-sided small to moderate, with complexity.  Varicocele:  Absent  Pulsed Doppler interrogation of both testes demonstrates low resistance flow bilaterally.  IMPRESSION:  1.  Findings of  right-sided orchitis. 2.  No evidence of testicular torsion. 3.  Mildly complex right-sided hydrocele, likely secondary.   Original Report Authenticated By: Jeronimo Greaves, M.D.    [place] Art/ven Flow Abd Pelv Doppler  09/09/2012  *RADIOLOGY REPORT*  Clinical Data:  Right testicular pain.  Suspected epididymitis. Rule out torsion.  SCROTAL ULTRASOUND DOPPLER ULTRASOUND OF THE TESTICLES  Technique: Complete ultrasound examination of the testicles, epididymis, and other scrotal structures was performed.  Color and spectral Doppler ultrasound were also utilized to evaluate blood flow to the testicles.  Comparison:  None  Findings:  Right testis:  5.3 x 3.9 x 4.3 cm.  Increased vascularity/color Doppler signal.  Normal gray scale appearance.  Left testis:  4.3 x 3.1 x 2.9 cm.  Dilatation of the rete testes is a normal variant laterally.  Normal gray scale and color Doppler appearance.  Right epididymis:  Tiny cyst versus spermatocele.  No definite increased vascularity.  Left epididymis:  Normal in size and appearance.  Hydrocele:  Right-sided small to moderate, with complexity.  Varicocele:  Absent  Pulsed Doppler interrogation of both testes demonstrates low resistance flow bilaterally.  IMPRESSION:  1.  Findings of right-sided orchitis. 2.  No evidence of testicular torsion. 3.  Mildly complex right-sided hydrocele, likely secondary.   Original Report Authenticated By: Jeronimo Greaves, M.D.    Dg Chest Portable 1 View  09/08/2012  *RADIOLOGY REPORT*  Clinical Data: Chest pain.  PORTABLE CHEST - 1 VIEW  Comparison: 01/09/2004.  Findings: The heart is mildly enlarged but stable.  The mediastinal and hilar contours are stable.  Minimal streaky bibasilar atelectasis but no infiltrates, edema or effusions.  The bony thorax is intact.  IMPRESSION: Streaky bibasilar atelectasis but no definite infiltrates or effusions.   Original Report Authenticated By: Rudie Meyer, M.D.     CBC  Lab 09/09/12 0450 09/08/12 1215  WBC  20.0* 17.9*  HGB 11.9* 13.4  HCT 36.0* 40.3  PLT 152 196  MCV 97.3 95.5  MCH 32.2 31.8  MCHC 33.1 33.3  RDW 13.4 13.2  LYMPHSABS -- --  MONOABS -- --  EOSABS -- --  BASOSABS -- --  BANDABS -- --    Chemistries   Lab 09/09/12 0450 09/08/12 1215  NA 142 140  K 4.7 5.2*  CL 105 103  CO2 29 23  GLUCOSE 109* 200*  BUN 18 14  CREATININE 0.95 0.79  CALCIUM 8.8 9.2  MG -- --  AST -- --  ALT -- --  ALKPHOS -- --  BILITOT -- --   ------------------------------------------------------------------------------------------------------------------ estimated creatinine clearance is 86.7 ml/min (by C-G formula based on Cr of 0.95). ------------------------------------------------------------------------------------------------------------------ No results found for this basename: HGBA1C:2 in the last 72 hours ------------------------------------------------------------------------------------------------------------------ No results found for this basename: CHOL:2,HDL:2,LDLCALC:2,TRIG:2,CHOLHDL:2,LDLDIRECT:2 in the last 72 hours ------------------------------------------------------------------------------------------------------------------ No results found for this basename: TSH,T4TOTAL,FREET3,T3FREE,THYROIDAB in the last 72 hours ------------------------------------------------------------------------------------------------------------------ No results found for this basename: VITAMINB12:2,FOLATE:2,FERRITIN:2,TIBC:2,IRON:2,RETICCTPCT:2 in the last 72 hours  Coagulation profile  Lab 09/09/12 0450 09/08/12 2045  INR 3.09* 2.99*  PROTIME -- --    No results found for this basename: DDIMER:2 in the last 72 hours  Cardiac Enzymes  Lab 09/09/12 0727 09/09/12 0053 09/08/12 1950  CKMB -- -- --  TROPONINI <0.30 <0.30 <0.30  MYOGLOBIN -- -- --   ------------------------------------------------------------------------------------------------------------------ No components found  with this basename: POCBNP:3

## 2012-09-10 ENCOUNTER — Other Ambulatory Visit: Payer: Self-pay

## 2012-09-10 LAB — BASIC METABOLIC PANEL
BUN: 24 mg/dL — ABNORMAL HIGH (ref 6–23)
CO2: 27 mEq/L (ref 19–32)
Calcium: 8.9 mg/dL (ref 8.4–10.5)
Creatinine, Ser: 0.99 mg/dL (ref 0.50–1.35)
Glucose, Bld: 106 mg/dL — ABNORMAL HIGH (ref 70–99)

## 2012-09-10 LAB — PROTIME-INR
INR: 2.26 — ABNORMAL HIGH (ref 0.00–1.49)
Prothrombin Time: 24 seconds — ABNORMAL HIGH (ref 11.6–15.2)

## 2012-09-10 LAB — GLUCOSE, CAPILLARY
Glucose-Capillary: 94 mg/dL (ref 70–99)
Glucose-Capillary: 97 mg/dL (ref 70–99)
Glucose-Capillary: 117 mg/dL — ABNORMAL HIGH (ref 70–99)

## 2012-09-10 MED ORDER — DILTIAZEM HCL 60 MG PO TABS
60.0000 mg | ORAL_TABLET | Freq: Three times a day (TID) | ORAL | Status: DC
  Administered 2012-09-10: 60 mg via ORAL
  Filled 2012-09-10 (×4): qty 1

## 2012-09-10 MED ORDER — SODIUM CHLORIDE 0.9 % IV SOLN
250.0000 mL | INTRAVENOUS | Status: AC
  Administered 2012-09-10: 250 mL via INTRAVENOUS

## 2012-09-10 MED ORDER — MAGNESIUM SULFATE 40 MG/ML IJ SOLN
2.0000 g | Freq: Once | INTRAMUSCULAR | Status: AC
  Administered 2012-09-10: 2 g via INTRAVENOUS
  Filled 2012-09-10: qty 50

## 2012-09-10 MED ORDER — ZOLPIDEM TARTRATE 5 MG PO TABS
5.0000 mg | ORAL_TABLET | Freq: Every evening | ORAL | Status: DC | PRN
  Administered 2012-09-10 – 2012-09-11 (×2): 5 mg via ORAL
  Filled 2012-09-10 (×2): qty 1

## 2012-09-10 MED ORDER — METOPROLOL TARTRATE 50 MG PO TABS
50.0000 mg | ORAL_TABLET | Freq: Two times a day (BID) | ORAL | Status: DC
  Administered 2012-09-10 – 2012-09-12 (×5): 50 mg via ORAL
  Filled 2012-09-10 (×7): qty 1

## 2012-09-10 MED ORDER — DOCUSATE SODIUM 100 MG PO CAPS: 100.0000 mg | ORAL_CAPSULE | Freq: Two times a day (BID) | ORAL | Status: DC

## 2012-09-10 MED ORDER — DOCUSATE SODIUM 100 MG PO CAPS
200.0000 mg | ORAL_CAPSULE | Freq: Two times a day (BID) | ORAL | Status: DC
  Administered 2012-09-10 – 2012-09-12 (×5): 200 mg via ORAL
  Filled 2012-09-10 (×7): qty 2

## 2012-09-10 MED ORDER — POLYETHYLENE GLYCOL 3350 17 G PO PACK
17.0000 g | PACK | Freq: Every day | ORAL | Status: DC
  Administered 2012-09-10 – 2012-09-12 (×3): 17 g via ORAL
  Filled 2012-09-10 (×4): qty 1

## 2012-09-10 NOTE — Progress Notes (Signed)
Patient had a 10 beat run of V-Tach while sleeping. No complaints and no signs or symptoms of distress or discomfort. BP is 104/50 and HR is 91. MD notified and new orders given. Will continue to monitor patient for further changes in condition.

## 2012-09-10 NOTE — Progress Notes (Signed)
ANTICOAGULATION CONSULT NOTE - Follow up Consult  Pharmacy Consult for Coumadin Indication: atrial fibrillation  Allergies  Allergen Reactions  . Penicillins     Patient Measurements: Height: 6' (182.9 cm) Weight: 270 lb 3.2 oz (122.562 kg) IBW/kg (Calculated) : 77.6   Vital Signs: Temp: 99.5 F (37.5 C) (01/02 0604) Temp src: Oral (01/02 0604) BP: 104/50 mmHg (01/02 1200) Pulse Rate: 91  (01/02 1200)  Labs:  Alvira Philips 09/10/12 0625 09/09/12 0727 09/09/12 0450 09/09/12 0053 09/08/12 2045 09/08/12 1950 09/08/12 1215  HGB -- -- 11.9* -- -- -- 13.4  HCT -- -- 36.0* -- -- -- 40.3  PLT -- -- 152 -- -- -- 196  APTT -- -- -- -- -- -- --  LABPROT 24.0* -- 30.2* -- 29.5* -- --  INR 2.26* -- 3.09* -- 2.99* -- --  HEPARINUNFRC -- -- -- -- -- -- --  CREATININE -- -- 0.95 -- -- -- 0.79  CKTOTAL -- -- -- -- -- -- --  CKMB -- -- -- -- -- -- --  TROPONINI -- <0.30 -- <0.30 -- <0.30 --    Estimated Creatinine Clearance: 86.7 ml/min (by C-G formula based on Cr of 0.95).   Medical History: Past Medical History  Diagnosis Date  . Arthritis   . CHF (congestive heart failure)   . COPD (chronic obstructive pulmonary disease)   . Diabetes mellitus without complication   . Hypertension   . Stroke   . Gout   . Alcohol abuse   . GIB (gastrointestinal bleeding)   . Complication of anesthesia     2004   stopped breathing  3 times  on the table "  . Shortness of breath   . Sleep apnea 09/08/2012    Newly diagnosed  . Atrial fibrillation   . DVT, lower extremity      2005    Medications:  Prescriptions prior to admission  Medication Sig Dispense Refill  . allopurinol (ZYLOPRIM) 100 MG tablet Take 100 mg by mouth daily.      Marland Kitchen diltiazem (DILACOR XR) 240 MG 24 hr capsule Take 240 mg by mouth daily.      Marland Kitchen doxazosin (CARDURA) 4 MG tablet Take 4 mg by mouth daily.      Marland Kitchen gabapentin (NEURONTIN) 300 MG capsule Take 300 mg by mouth at bedtime.      Marland Kitchen lisinopril (PRINIVIL,ZESTRIL) 10 MG  tablet Take 10 mg by mouth daily.      . metFORMIN (GLUCOPHAGE) 1000 MG tablet Take 1,000 mg by mouth daily with breakfast.      . metoprolol tartrate (LOPRESSOR) 25 MG tablet Take 25 mg by mouth daily.      . potassium chloride SA (K-DUR,KLOR-CON) 20 MEQ tablet Take 20 mEq by mouth daily.      . pravastatin (PRAVACHOL) 80 MG tablet Take 80 mg by mouth daily.      Marland Kitchen warfarin (COUMADIN) 5 MG tablet Take 5 mg by mouth every morning.        Assessment: 79 y/o male patient on chronic coumadin for h/o afib.  continuing Coumadin for Afib. Admit INR (2.99) is therapeutic. INR remains therapeutic today but trend down, no bleeding reported.  Goal of Therapy:  INR 2-3   Plan:  Continue coumadin 5mg  daily and f/u daily protime.  Verlene Mayer, PharmD, BCPS Pager [phone] 09/10/2012,1:05 PM

## 2012-09-10 NOTE — Progress Notes (Addendum)
[facility]                                                                                Patient Demographics  Raymond Chaney, is a 79 y.o. male  ZOX:[id number]  [id number]  DOB - 1932/03/30  Admit date - 09/08/2012  Admitting Physician Standley Brooking, MD  Outpatient Primary MD for the patient is Hoyle Sauer, MD  LOS - 2   Chief Complaint  Patient presents with  . Chest Pain        Assessment & Plan     1. Right-sided orchitis: Continue IV Levaquin, appreciate urology input, monitor clinically. Will follow urine cultures. Clinically much improved.   2. UTI: Urine culture. Empiric antibiotics as above.   3. Atypical chest pain on admission: No signs or symptoms to suggest acute coronary syndrome. Pain present more than 12 hours with negative troponin and reassuring EKG. Serial cardiac enzymes are negative, Would not pursue further evaluation. Heart catheterization in 04/2007 at that time had noncritical coronary artery disease. Patient follows with his cardiologist.    4. Atrial fibrillation: Rate controlled. Continue warfarin, diltiazem and metoprolol.   Lab Results  Component Value Date   INR 2.26* 09/10/2012   INR 3.09* 09/09/2012   INR 2.99* 09/08/2012      5. Diabetes mellitus: Sliding scale insulin. Resume metformin on discharge.   CBG (last 3)   Basename 09/10/12 0607 09/09/12 2134 09/09/12 1633  GLUCAP 97 116* 122*      6. History of Alcohol use: Previous hospitalization notable for severe delirium tremens. Place on scheduled Librium along with CIWA protocol.     7. 2 second pause on telemetry on 09/09/2012, 10 beat run of asymptomatic V Tach 09-11-11, patient was on combination of beta blocker and calcium channel blocker, at this time have placed him on low-dose b blocker only, monitor on telemetry, Cardizem  Held. Cycle troponin, check lytes, mag Iv now, Echo.        Code Status: Full  Family  Communication: Wife and patient  Disposition Plan: Home   Procedures Testicular ultrasound   Consults  urology   DVT Prophylaxis  Couamdin  Lab Results  Component Value Date   INR 2.26* 09/10/2012   INR 3.09* 09/09/2012   INR 2.99* 09/08/2012     Lab Results  Component Value Date   PLT 152 09/09/2012    Medications  Scheduled Meds:    . allopurinol  100 mg Oral Daily  . atorvastatin  20 mg Oral q1800  . chlordiazePOXIDE  10 mg Oral TID  . diltiazem  60 mg Oral Q8H  . docusate sodium  100 mg Oral BID  . doxazosin  4 mg Oral Daily  . folic acid  1 mg Oral Daily  . gabapentin  300 mg Oral QHS  . insulin aspart  0-9 Units Subcutaneous TID WC  . levofloxacin (LEVAQUIN) IV  500 mg Intravenous Q24H  . lisinopril  10 mg Oral Daily  . multivitamin with minerals  1 tablet Oral Daily  . senna  2 tablet Oral Once  . sodium chloride  3 mL Intravenous Q12H  . sodium chloride  3 mL Intravenous  Q12H  . thiamine  100 mg Oral Daily   Or  . thiamine  100 mg Intravenous Daily  . warfarin  5 mg Oral q1800  . Warfarin - Pharmacist Dosing Inpatient   Does not apply q1800   Continuous Infusions:    . sodium chloride 250 mL (09/09/12 1527)   PRN Meds:.acetaminophen, albuterol, alum & mag hydroxide-simeth, HYDROcodone-acetaminophen, ipratropium, LORazepam, LORazepam, metoprolol, morphine injection, ondansetron (ZOFRAN) IV, sodium chloride  Antibiotics    Anti-infectives     Start     Dose/Rate Route Frequency Ordered Stop   09/09/12 2000   levofloxacin (LEVAQUIN) IVPB 500 mg        500 mg 100 mL/hr over 60 Minutes Intravenous Every 24 hours 09/09/12 1312     09/09/12 1245   ciprofloxacin (CIPRO) IVPB 400 mg  Status:  Discontinued        400 mg 200 mL/hr over 60 Minutes Intravenous Every 12 hours 09/09/12 1237 09/09/12 1241   09/08/12 2000   levofloxacin (LEVAQUIN) IVPB 500 mg  Status:  Discontinued        500 mg 100 mL/hr over 60 Minutes Intravenous Every 24 hours 09/08/12  1902 09/09/12 1237   09/08/12 1400   cefTRIAXone (ROCEPHIN) 1 g in dextrose 5 % 50 mL IVPB        1 g 100 mL/hr over 30 Minutes Intravenous  Once 09/08/12 1352 09/08/12 1650           Time Spent in minutes   35   Aldena Worm K M.D on 09/10/2012 at 10:37 AM  Between 7am to 7pm - Pager - [phone]  After 7pm go to [email or web address] - password [email or web address]  And look for the night coverage person covering for me after hours  [facility] Office  [phone]    Subjective:   Naksh Radi today has, No headache, No chest pain, No abdominal pain - No Nausea, No new weakness tingling or numbness, No Cough - SOB. Is having scrotal and right testicular pain but much improved.  Objective:   Filed Vitals:   09/09/12 2117 09/10/12 0315 09/10/12 0604 09/10/12 0901  BP: 104/48  127/62 127/65  Pulse: 98  89 91  Temp: 98.8 F (37.1 C)  99.5 F (37.5 C)   TempSrc: Oral  Oral   Resp: 18  18   Height:      Weight:  122.562 kg (270 lb 3.2 oz)    SpO2: 98%  100%     Wt Readings from Last 3 Encounters:  09/10/12 122.562 kg (270 lb 3.2 oz)     Intake/Output Summary (Last 24 hours) at 09/10/12 1037 Last data filed at 09/10/12 0900  Gross per 24 hour  Intake 2449.67 ml  Output   1050 ml  Net 1399.67 ml    Exam Awake Alert, Oriented X 3, No new F.N deficits, Normal affect [employer].AT,PERRAL Supple Neck,No JVD, No cervical lymphadenopathy appriciated.  Symmetrical Chest wall movement, Good air movement bilaterally, CTAB RRR,No Gallops,Rubs or new Murmurs, No Parasternal Heave +ve B.Sounds, Abd Soft, Non tender, No organomegaly appriciated, No rebound - guarding or rigidity. No Cyanosis, Clubbing or edema, No new Rash or bruise, scrotum is swollen, with redness around his right testicle, right testicle is tender to touch.   Data Review   Micro Results Recent Results (from the past 240 hour(s))  URINE CULTURE     Status: Normal   Collection Time   09/08/12  3:10 PM       Component Value  Range Status Comment   Specimen Description URINE, CLEAN CATCH   Final    Special Requests NONE   Final    Culture  Setup Time 09/08/2012 16:30   Final    Colony Count 8,000 COLONIES/ML   Final    Culture INSIGNIFICANT GROWTH   Final    Report Status 09/09/2012 FINAL   Final   CULTURE, BLOOD (ROUTINE X 2)     Status: Normal (Preliminary result)   Collection Time   09/08/12 10:00 PM      Component Value Range Status Comment   Specimen Description BLOOD RIGHT HAND   Final    Special Requests BOTTLES DRAWN AEROBIC AND ANAEROBIC 7CC   Final    Culture  Setup Time 09/09/2012 06:39   Final    Culture     Final    Value:        BLOOD CULTURE RECEIVED NO GROWTH TO DATE CULTURE WILL BE HELD FOR 5 DAYS BEFORE ISSUING A FINAL NEGATIVE REPORT   Report Status PENDING   Incomplete   CULTURE, BLOOD (ROUTINE X 2)     Status: Normal (Preliminary result)   Collection Time   09/08/12 10:15 PM      Component Value Range Status Comment   Specimen Description BLOOD LEFT ARM   Final    Special Requests BOTTLES DRAWN AEROBIC ONLY 10CC   Final    Culture  Setup Time 09/09/2012 06:39   Final    Culture     Final    Value:        BLOOD CULTURE RECEIVED NO GROWTH TO DATE CULTURE WILL BE HELD FOR 5 DAYS BEFORE ISSUING A FINAL NEGATIVE REPORT   Report Status PENDING   Incomplete     Radiology Reports US Scrotum  09/09/2012  *RADIOLOGY REPORT*  Clinical Data:  Right testicular pain.  Suspected epididymitis. Rule out torsion.  SCROTAL ULTRASOUND DOPPLER ULTRASOUND OF THE TESTICLES  Technique: Complete ultrasound examination of the testicles, epididymis, and other scrotal structures was performed.  Color and spectral Doppler ultrasound were also utilized to evaluate blood flow to the testicles.  Comparison:  None  Findings:  Right testis:  5.3 x 3.9 x 4.3 cm.  Increased vascularity/color Doppler signal.  Normal gray scale appearance.  Left testis:  4.3 x 3.1 x 2.9 cm.  Dilatation of the rete testes is  a normal variant laterally.  Normal gray scale and color Doppler appearance.  Right epididymis:  Tiny cyst versus spermatocele.  No definite increased vascularity.  Left epididymis:  Normal in size and appearance.  Hydrocele:  Right-sided small to moderate, with complexity.  Varicocele:  Absent  Pulsed Doppler interrogation of both testes demonstrates low resistance flow bilaterally.  IMPRESSION:  1.  Findings of right-sided orchitis. 2.  No evidence of testicular torsion. 3.  Mildly complex right-sided hydrocele, likely secondary.   Original Report Authenticated By: Jeronimo Greaves, M.D.    [place] Art/ven Flow Abd Pelv Doppler  09/09/2012  *RADIOLOGY REPORT*  Clinical Data:  Right testicular pain.  Suspected epididymitis. Rule out torsion.  SCROTAL ULTRASOUND DOPPLER ULTRASOUND OF THE TESTICLES  Technique: Complete ultrasound examination of the testicles, epididymis, and other scrotal structures was performed.  Color and spectral Doppler ultrasound were also utilized to evaluate blood flow to the testicles.  Comparison:  None  Findings:  Right testis:  5.3 x 3.9 x 4.3 cm.  Increased vascularity/color Doppler signal.  Normal gray scale appearance.  Left testis:  4.3 x 3.1 x 2.9  cm.  Dilatation of the rete testes is a normal variant laterally.  Normal gray scale and color Doppler appearance.  Right epididymis:  Tiny cyst versus spermatocele.  No definite increased vascularity.  Left epididymis:  Normal in size and appearance.  Hydrocele:  Right-sided small to moderate, with complexity.  Varicocele:  Absent  Pulsed Doppler interrogation of both testes demonstrates low resistance flow bilaterally.  IMPRESSION:  1.  Findings of right-sided orchitis. 2.  No evidence of testicular torsion. 3.  Mildly complex right-sided hydrocele, likely secondary.   Original Report Authenticated By: Jeronimo Greaves, M.D.    Dg Chest Portable 1 View  09/08/2012  *RADIOLOGY REPORT*  Clinical Data: Chest pain.  PORTABLE CHEST - 1 VIEW   Comparison: 01/09/2004.  Findings: The heart is mildly enlarged but stable.  The mediastinal and hilar contours are stable.  Minimal streaky bibasilar atelectasis but no infiltrates, edema or effusions.  The bony thorax is intact.  IMPRESSION: Streaky bibasilar atelectasis but no definite infiltrates or effusions.   Original Report Authenticated By: Rudie Meyer, M.D.     CBC  Lab 09/09/12 0450 09/08/12 1215  WBC 20.0* 17.9*  HGB 11.9* 13.4  HCT 36.0* 40.3  PLT 152 196  MCV 97.3 95.5  MCH 32.2 31.8  MCHC 33.1 33.3  RDW 13.4 13.2  LYMPHSABS -- --  MONOABS -- --  EOSABS -- --  BASOSABS -- --  BANDABS -- --    Chemistries   Lab 09/09/12 0450 09/08/12 1215  NA 142 140  K 4.7 5.2*  CL 105 103  CO2 29 23  GLUCOSE 109* 200*  BUN 18 14  CREATININE 0.95 0.79  CALCIUM 8.8 9.2  MG -- --  AST -- --  ALT -- --  ALKPHOS -- --  BILITOT -- --   ------------------------------------------------------------------------------------------------------------------ estimated creatinine clearance is 86.7 ml/min (by C-G formula based on Cr of 0.95). ------------------------------------------------------------------------------------------------------------------ No results found for this basename: HGBA1C:2 in the last 72 hours ------------------------------------------------------------------------------------------------------------------ No results found for this basename: CHOL:2,HDL:2,LDLCALC:2,TRIG:2,CHOLHDL:2,LDLDIRECT:2 in the last 72 hours ------------------------------------------------------------------------------------------------------------------ No results found for this basename: TSH,T4TOTAL,FREET3,T3FREE,THYROIDAB in the last 72 hours ------------------------------------------------------------------------------------------------------------------ No results found for this basename: VITAMINB12:2,FOLATE:2,FERRITIN:2,TIBC:2,IRON:2,RETICCTPCT:2 in the last 72 hours  Coagulation  profile  Lab 09/10/12 0625 09/09/12 0450 09/08/12 2045  INR 2.26* 3.09* 2.99*  PROTIME -- -- --    No results found for this basename: DDIMER:2 in the last 72 hours  Cardiac Enzymes  Lab 09/09/12 0727 09/09/12 0053 09/08/12 1950  CKMB -- -- --  TROPONINI <0.30 <0.30 <0.30  MYOGLOBIN -- -- --   ------------------------------------------------------------------------------------------------------------------ No components found with this basename: POCBNP:3

## 2012-09-10 NOTE — Progress Notes (Signed)
UR Chart Review Completed  

## 2012-09-11 LAB — CBC
MCV: 96.9 fL (ref 78.0–100.0)
Platelets: 154 10*3/uL (ref 150–400)
RDW: 13 % (ref 11.5–15.5)
WBC: 8.9 10*3/uL (ref 4.0–10.5)

## 2012-09-11 LAB — TROPONIN I: Troponin I: <0.3 ng/mL (ref <0.30)

## 2012-09-11 LAB — BASIC METABOLIC PANEL
Chloride: 104 mEq/L (ref 96–112)
Creatinine, Ser: 0.93 mg/dL (ref 0.50–1.35)
GFR calc Af Amer: >90 mL/min (ref >90)

## 2012-09-11 LAB — GLUCOSE, CAPILLARY
Glucose-Capillary: 99 mg/dL (ref 70–99)
Glucose-Capillary: 121 mg/dL — ABNORMAL HIGH (ref 70–99)
Glucose-Capillary: 116 mg/dL — ABNORMAL HIGH (ref 70–99)

## 2012-09-11 LAB — PROTIME-INR: INR: 2.11 — ABNORMAL HIGH (ref 0.00–1.49)

## 2012-09-11 LAB — MAGNESIUM: Magnesium: 2 mg/dL (ref 1.5–2.5)

## 2012-09-11 MED ORDER — LEVOFLOXACIN 500 MG PO TABS
500.0000 mg | ORAL_TABLET | Freq: Every day | ORAL | Status: DC
  Administered 2012-09-11 – 2012-09-12 (×2): 500 mg via ORAL
  Filled 2012-09-11 (×3): qty 1

## 2012-09-11 MED ORDER — BISACODYL 10 MG RE SUPP
10.0000 mg | Freq: Every day | RECTAL | Status: DC | PRN
  Administered 2012-09-11: 10 mg via RECTAL
  Filled 2012-09-11: qty 1

## 2012-09-11 MED ORDER — LACTULOSE 10 GM/15ML PO SOLN
30.0000 g | Freq: Two times a day (BID) | ORAL | Status: AC
  Administered 2012-09-11 (×2): 30 g via ORAL
  Filled 2012-09-11 (×2): qty 45

## 2012-09-11 MED ORDER — SODIUM POLYSTYRENE SULFONATE 15 GM/60ML PO SUSP
15.0000 g | Freq: Once | ORAL | Status: AC
  Administered 2012-09-11: 15 g via ORAL
  Filled 2012-09-11 (×2): qty 60

## 2012-09-11 NOTE — Progress Notes (Addendum)
[facility]                                                                                Patient Demographics  Raymond Chaney, is a 79 y.o. male  AVW:[id number]  WGN:[id number]  DOB - [birth year]  Admit date - 09/08/2012  Admitting Physician Standley Brooking, MD  Outpatient Primary MD for the patient is Raymond Sauer, MD  LOS - 3   Chief Complaint  Patient presents with  . Chest Pain        Assessment & Plan     1. Right-sided orchitis: Continue Levaquin, appreciate urology input, monitor clinically. Will follow urine cultures. Clinically much improved.   2. UTI: Urine culture. Empiric antibiotics as above.   3. Atypical chest pain on admission: No signs or symptoms to suggest acute coronary syndrome. Pain present more than 12 hours with negative troponin and reassuring EKG. Serial cardiac enzymes are negative, Would not pursue further evaluation. Heart catheterization in 04/2007 at that time had noncritical coronary artery disease. Patient follows with his cardiologist.    4. Atrial fibrillation: Rate controlled. Continue warfarin, diltiazem and metoprolol.   Lab Results  Component Value Date   INR 2.11* 09/11/2012   INR 2.26* 09/10/2012   INR 3.09* 09/09/2012      5. Diabetes mellitus: Sliding scale insulin. Resume metformin on discharge.   CBG (last 3)   Basename 09/11/12 0634 09/10/12 2203 09/10/12 1620  GLUCAP 99 90 117*      6. History of Alcohol use: Previous hospitalization notable for severe delirium tremens. Place on scheduled Librium along with CIWA protocol.     7. 2 second pause on telemetry on 09/09/2012, 10 beat run of asymptomatic V Tach 09-11-11 & 3 beat 09-12-11, patient was on combination of beta blocker and calcium channel blocker, at this time have placed him on low-dose b blocker only, monitor on telemetry, Cardizem  Held initial sinus pauses. Negative cycled troponin, stable lytes and magnesium, Echo checked,  case d/W cards Dr Patty Sermons, no further testing, outpt Cards follow.    8. Severe constipation causing mild hyperkalemia due to lack of GI excretion- bowel regimen in place, abdomen is soft nontender, is passing flatus, will give soapsuds enema, gentle Kayexalate for mild hyperkalemia, repeat potassium in the morning.    9. Mild hyperkalemia as a #8 above, holding parameters for lisinopril written.    Code Status: Full  Family Communication: Wife and patient  Disposition Plan: Home   Procedures Testicular ultrasound   Consults  urology   DVT Prophylaxis  Couamdin  Lab Results  Component Value Date   INR 2.11* 09/11/2012   INR 2.26* 09/10/2012   INR 3.09* 09/09/2012     Lab Results  Component Value Date   PLT 154 09/11/2012    Medications  Scheduled Meds:    . allopurinol  100 mg Oral Daily  . atorvastatin  20 mg Oral q1800  . chlordiazePOXIDE  10 mg Oral TID  . docusate sodium  200 mg Oral BID  . doxazosin  4 mg Oral Daily  . folic acid  1 mg Oral Daily  . gabapentin  300 mg Oral QHS  .  insulin aspart  0-9 Units Subcutaneous TID WC  . levofloxacin  500 mg Oral Daily  . lisinopril  10 mg Oral Daily  . metoprolol tartrate  50 mg Oral BID  . multivitamin with minerals  1 tablet Oral Daily  . polyethylene glycol  17 g Oral Daily  . senna  2 tablet Oral Once  . sodium chloride  3 mL Intravenous Q12H  . sodium chloride  3 mL Intravenous Q12H  . sodium polystyrene  15 g Oral Once  . thiamine  100 mg Oral Daily   Or  . thiamine  100 mg Intravenous Daily  . warfarin  5 mg Oral q1800  . Warfarin - Pharmacist Dosing Inpatient   Does not apply q1800   Continuous Infusions:   PRN Meds:.acetaminophen, albuterol, alum & mag hydroxide-simeth, HYDROcodone-acetaminophen, ipratropium, LORazepam, LORazepam, metoprolol, morphine injection, ondansetron (ZOFRAN) IV, sodium chloride, zolpidem  Antibiotics    Anti-infectives     Start     Dose/Rate Route Frequency Ordered  Stop   09/11/12 2000   levofloxacin (LEVAQUIN) tablet 500 mg        500 mg Oral Daily 09/11/12 0819     09/09/12 2000   levofloxacin (LEVAQUIN) IVPB 500 mg  Status:  Discontinued        500 mg 100 mL/hr over 60 Minutes Intravenous Every 24 hours 09/09/12 1312 09/11/12 0819   09/09/12 1245   ciprofloxacin (CIPRO) IVPB 400 mg  Status:  Discontinued        400 mg 200 mL/hr over 60 Minutes Intravenous Every 12 hours 09/09/12 1237 09/09/12 1241   09/08/12 2000   levofloxacin (LEVAQUIN) IVPB 500 mg  Status:  Discontinued        500 mg 100 mL/hr over 60 Minutes Intravenous Every 24 hours 09/08/12 1902 09/09/12 1237   09/08/12 1400   cefTRIAXone (ROCEPHIN) 1 g in dextrose 5 % 50 mL IVPB        1 g 100 mL/hr over 30 Minutes Intravenous  Once 09/08/12 1352 09/08/12 1650           Time Spent in minutes   35   SINGH,PRASHANT K M.D on 09/11/2012 at 10:32 AM  Between 7am to 7pm - Pager - [phone]  After 7pm go to [email or web address] - password [email or web address]  And look for the night coverage person covering for me after hours  [facility] Office  [phone]    Subjective:   Raymond Chaney today has, No headache, No chest pain, No abdominal pain - No Nausea, No new weakness tingling or numbness, No Cough - SOB. Is having scrotal and right testicular pain but much improved.  Objective:   Filed Vitals:   09/10/12 1200 09/10/12 1300 09/10/12 2208 09/11/12 0414  BP: 104/50 105/53 132/71 142/80  Pulse: 91 92 77 66  Temp:  98 F (36.7 C) 98.1 F (36.7 C) 98.4 F (36.9 C)  TempSrc:  Oral Oral Oral  Resp:  18 22 20   Height:      Weight:    124.24 kg (273 lb 14.4 oz)  SpO2:  100% 98% 95%    Wt Readings from Last 3 Encounters:  09/11/12 124.24 kg (273 lb 14.4 oz)     Intake/Output Summary (Last 24 hours) at 09/11/12 1032 Last data filed at 09/11/12 0849  Gross per 24 hour  Intake   1060 ml  Output   2050 ml  Net   -990 ml    Exam Awake Alert, Oriented X 3,  No new  F.N deficits, Normal affect [place].AT,PERRAL Supple Neck,No JVD, No cervical lymphadenopathy appriciated.  Symmetrical Chest wall movement, Good air movement bilaterally, CTAB RRR,No Gallops,Rubs or new Murmurs, No Parasternal Heave +ve B.Sounds, Abd Soft, Non tender, No organomegaly appriciated, No rebound - guarding or rigidity. No Cyanosis, Clubbing or edema, No new Rash or bruise, scrotum is swollen, with redness around his right testicle, right testicle is tender to touch.   Data Review   Micro Results Recent Results (from the past 240 hour(s))  URINE CULTURE     Status: Normal   Collection Time   09/08/12  3:10 PM      Component Value Range Status Comment   Specimen Description URINE, CLEAN CATCH   Final    Special Requests NONE   Final    Culture  Setup Time 09/08/2012 16:30   Final    Colony Count 8,000 COLONIES/ML   Final    Culture INSIGNIFICANT GROWTH   Final    Report Status 09/09/2012 FINAL   Final   CULTURE, BLOOD (ROUTINE X 2)     Status: Normal (Preliminary result)   Collection Time   09/08/12 10:00 PM      Component Value Range Status Comment   Specimen Description BLOOD RIGHT HAND   Final    Special Requests BOTTLES DRAWN AEROBIC AND ANAEROBIC 7CC   Final    Culture  Setup Time 09/09/2012 06:39   Final    Culture     Final    Value:        BLOOD CULTURE RECEIVED NO GROWTH TO DATE CULTURE WILL BE HELD FOR 5 DAYS BEFORE ISSUING A FINAL NEGATIVE REPORT   Report Status PENDING   Incomplete   CULTURE, BLOOD (ROUTINE X 2)     Status: Normal (Preliminary result)   Collection Time   09/08/12 10:15 PM      Component Value Range Status Comment   Specimen Description BLOOD LEFT ARM   Final    Special Requests BOTTLES DRAWN AEROBIC ONLY 10CC   Final    Culture  Setup Time 09/09/2012 06:39   Final    Culture     Final    Value:        BLOOD CULTURE RECEIVED NO GROWTH TO DATE CULTURE WILL BE HELD FOR 5 DAYS BEFORE ISSUING A FINAL NEGATIVE REPORT   Report Status PENDING    Incomplete     Radiology Reports US Scrotum  09/09/2012  *RADIOLOGY REPORT*  Clinical Data:  Right testicular pain.  Suspected epididymitis. Rule out torsion.  SCROTAL ULTRASOUND DOPPLER ULTRASOUND OF THE TESTICLES  Technique: Complete ultrasound examination of the testicles, epididymis, and other scrotal structures was performed.  Color and spectral Doppler ultrasound were also utilized to evaluate blood flow to the testicles.  Comparison:  None  Findings:  Right testis:  5.3 x 3.9 x 4.3 cm.  Increased vascularity/color Doppler signal.  Normal gray scale appearance.  Left testis:  4.3 x 3.1 x 2.9 cm.  Dilatation of the rete testes is a normal variant laterally.  Normal gray scale and color Doppler appearance.  Right epididymis:  Tiny cyst versus spermatocele.  No definite increased vascularity.  Left epididymis:  Normal in size and appearance.  Hydrocele:  Right-sided small to moderate, with complexity.  Varicocele:  Absent  Pulsed Doppler interrogation of both testes demonstrates low resistance flow bilaterally.  IMPRESSION:  1.  Findings of right-sided orchitis. 2.  No evidence of testicular torsion. 3.  Mildly complex  right-sided hydrocele, likely secondary.   Original Report Authenticated By: Jeronimo Greaves, M.D.    [place] Art/ven Flow Abd Pelv Doppler  09/09/2012  *RADIOLOGY REPORT*  Clinical Data:  Right testicular pain.  Suspected epididymitis. Rule out torsion.  SCROTAL ULTRASOUND DOPPLER ULTRASOUND OF THE TESTICLES  Technique: Complete ultrasound examination of the testicles, epididymis, and other scrotal structures was performed.  Color and spectral Doppler ultrasound were also utilized to evaluate blood flow to the testicles.  Comparison:  None  Findings:  Right testis:  5.3 x 3.9 x 4.3 cm.  Increased vascularity/color Doppler signal.  Normal gray scale appearance.  Left testis:  4.3 x 3.1 x 2.9 cm.  Dilatation of the rete testes is a normal variant laterally.  Normal gray scale and color Doppler  appearance.  Right epididymis:  Tiny cyst versus spermatocele.  No definite increased vascularity.  Left epididymis:  Normal in size and appearance.  Hydrocele:  Right-sided small to moderate, with complexity.  Varicocele:  Absent  Pulsed Doppler interrogation of both testes demonstrates low resistance flow bilaterally.  IMPRESSION:  1.  Findings of right-sided orchitis. 2.  No evidence of testicular torsion. 3.  Mildly complex right-sided hydrocele, likely secondary.   Original Report Authenticated By: Jeronimo Greaves, M.D.    Dg Chest Portable 1 View  09/08/2012  *RADIOLOGY REPORT*  Clinical Data: Chest pain.  PORTABLE CHEST - 1 VIEW  Comparison: 01/09/2004.  Findings: The heart is mildly enlarged but stable.  The mediastinal and hilar contours are stable.  Minimal streaky bibasilar atelectasis but no infiltrates, edema or effusions.  The bony thorax is intact.  IMPRESSION: Streaky bibasilar atelectasis but no definite infiltrates or effusions.   Original Report Authenticated By: Rudie Meyer, M.D.     CBC  Lab 09/11/12 0515 09/09/12 0450 09/08/12 1215  WBC 8.9 20.0* 17.9*  HGB 11.3* 11.9* 13.4  HCT 34.7* 36.0* 40.3  PLT 154 152 196  MCV 96.9 97.3 95.5  MCH 31.6 32.2 31.8  MCHC 32.6 33.1 33.3  RDW 13.0 13.4 13.2  LYMPHSABS -- -- --  MONOABS -- -- --  EOSABS -- -- --  BASOSABS -- -- --  BANDABS -- -- --    Chemistries   Lab 09/11/12 0515 09/10/12 1213 09/09/12 0450 09/08/12 1215  NA 140 137 142 140  K 5.2* 4.3 4.7 5.2*  CL 104 102 105 103  CO2 29 27 29 23   GLUCOSE 102* 106* 109* 200*  BUN 19 24* 18 14  CREATININE 0.93 0.99 0.95 0.79  CALCIUM 9.2 8.9 8.8 9.2  MG 2.0 2.0 -- --  AST -- -- -- --  ALT -- -- -- --  ALKPHOS -- -- -- --  BILITOT -- -- -- --   ------------------------------------------------------------------------------------------------------------------ estimated creatinine clearance is 89.1 ml/min (by C-G formula based on Cr of  0.93). ------------------------------------------------------------------------------------------------------------------ No results found for this basename: HGBA1C:2 in the last 72 hours ------------------------------------------------------------------------------------------------------------------ No results found for this basename: CHOL:2,HDL:2,LDLCALC:2,TRIG:2,CHOLHDL:2,LDLDIRECT:2 in the last 72 hours ------------------------------------------------------------------------------------------------------------------ No results found for this basename: TSH,T4TOTAL,FREET3,T3FREE,THYROIDAB in the last 72 hours ------------------------------------------------------------------------------------------------------------------ No results found for this basename: VITAMINB12:2,FOLATE:2,FERRITIN:2,TIBC:2,IRON:2,RETICCTPCT:2 in the last 72 hours  Coagulation profile  Lab 09/11/12 0515 09/10/12 0625 09/09/12 0450 09/08/12 2045  INR 2.11* 2.26* 3.09* 2.99*  PROTIME -- -- -- --    No results found for this basename: DDIMER:2 in the last 72 hours  Cardiac Enzymes  Lab 09/11/12 0020 09/10/12 1809 09/10/12 1213  CKMB -- -- --  TROPONINI <0.30 <0.30 <0.30  MYOGLOBIN -- -- --   ------------------------------------------------------------------------------------------------------------------  No components found with this basename: POCBNP:3

## 2012-09-11 NOTE — Progress Notes (Signed)
ANTICOAGULATION CONSULT NOTE - Follow up Consult  Pharmacy Consult for Coumadin Indication: atrial fibrillation  Allergies  Allergen Reactions  . Penicillins     Patient Measurements: Height: 6' (182.9 cm) Weight: 273 lb 14.4 oz (124.24 kg) (b scale) IBW/kg (Calculated) : 77.6   Vital Signs: Temp: 98.4 F (36.9 C) (01/03 0414) Temp src: Oral (01/03 0414) BP: 142/80 mmHg (01/03 0414) Pulse Rate: 66  (01/03 0414)  Labs:  Basename 09/11/12 0515 09/11/12 0020 09/10/12 1809 09/10/12 1213 09/10/12 0625 09/09/12 0450 09/08/12 1215  HGB 11.3* -- -- -- -- 11.9* --  HCT 34.7* -- -- -- -- 36.0* 40.3  PLT 154 -- -- -- -- 152 196  APTT -- -- -- -- -- -- --  LABPROT 22.8* -- -- -- 24.0* 30.2* --  INR 2.11* -- -- -- 2.26* 3.09* --  HEPARINUNFRC -- -- -- -- -- -- --  CREATININE 0.93 -- -- 0.99 -- 0.95 --  CKTOTAL -- -- -- -- -- -- --  CKMB -- -- -- -- -- -- --  TROPONINI -- <0.30 <0.30 <0.30 -- -- --    Estimated Creatinine Clearance: 89.1 ml/min (by C-G formula based on Cr of 0.93).   Medical History: Past Medical History  Diagnosis Date  . Arthritis   . CHF (congestive heart failure)   . COPD (chronic obstructive pulmonary disease)   . Diabetes mellitus without complication   . Hypertension   . Stroke   . Gout   . Alcohol abuse   . GIB (gastrointestinal bleeding)   . Complication of anesthesia     2004   stopped breathing  3 times  on the table "  . Shortness of breath   . Sleep apnea 09/08/2012    Newly diagnosed  . Atrial fibrillation   . DVT, lower extremity      2005    Medications:  Prescriptions prior to admission  Medication Sig Dispense Refill  . allopurinol (ZYLOPRIM) 100 MG tablet Take 100 mg by mouth daily.      Marland Kitchen diltiazem (DILACOR XR) 240 MG 24 hr capsule Take 240 mg by mouth daily.      Marland Kitchen doxazosin (CARDURA) 4 MG tablet Take 4 mg by mouth daily.      Marland Kitchen gabapentin (NEURONTIN) 300 MG capsule Take 300 mg by mouth at bedtime.      Marland Kitchen lisinopril  (PRINIVIL,ZESTRIL) 10 MG tablet Take 10 mg by mouth daily.      . metFORMIN (GLUCOPHAGE) 1000 MG tablet Take 1,000 mg by mouth daily with breakfast.      . metoprolol tartrate (LOPRESSOR) 25 MG tablet Take 25 mg by mouth daily.      . potassium chloride SA (K-DUR,KLOR-CON) 20 MEQ tablet Take 20 mEq by mouth daily.      . pravastatin (PRAVACHOL) 80 MG tablet Take 80 mg by mouth daily.      Marland Kitchen warfarin (COUMADIN) 5 MG tablet Take 5 mg by mouth every morning.        Assessment: 79 y/o male patient on chronic coumadin for h/o afib.  continuing Coumadin for Afib. Admit INR (2.99) is therapeutic. INR remains therapeutic today but trend down, no bleeding reported.  Goal of Therapy:  INR 2-3   Plan:  Continue coumadin 5mg  daily and f/u daily protime.  Verlene Mayer, PharmD, BCPS Pager [phone] 09/11/2012,10:32 AM

## 2012-09-11 NOTE — Evaluation (Signed)
Physical Therapy Evaluation Patient Details Name: Raymond Chaney MRN: [id number] DOB: [birth year] Today's Date: 09/11/2012 Time: 9562-1308 PT Time Calculation (min): 31 min  PT Assessment / Plan / Recommendation Clinical Impression  Patient is a 79 yo male admitted with chest pain, UTI, testicular pain, and weakness.  Patient with significan DOE with gait of 4/4, however O2 sat remained at 96% with gait. Patient will benefit from acute PT to maximize independence and activity tolerance prior to return home.    PT Assessment  Patient needs continued PT services    Follow Up Recommendations  No PT follow up;Supervision - Intermittent    Does the patient have the potential to tolerate intense rehabilitation      Barriers to Discharge None      Equipment Recommendations  Other (comment) (3-in-1 BSC)    Recommendations for Other Services     Frequency Min 3X/week    Precautions / Restrictions Precautions Precautions: None Restrictions Weight Bearing Restrictions: No   Pertinent Vitals/Pain Patient with dyspnea 4/4 with ambulation, with O2 sat remaining at 96%.     Mobility  Bed Mobility Bed Mobility: Not assessed Transfers Transfers: Sit to Stand;Stand to Sit Sit to Stand: 6: Modified independent (Device/Increase time);With upper extremity assist;With armrests;From chair/3-in-1 Stand to Sit: 6: Modified independent (Device/Increase time);With upper extremity assist;With armrests;To chair/3-in-1 Details for Transfer Assistance: Patient uses good hand placement and safe technique for transfers.  No cues or assist needed.  Patient to bathroom for BM - RN notified. Ambulation/Gait Ambulation/Gait Assistance: 5: Supervision Ambulation Distance (Feet): 110 Feet Assistive device: None Ambulation/Gait Assistance Details: Patient with steady gait, however significant DOE (4/4). O2 sats remained at 96% with gait. No loss of balance during gait. Gait Pattern: Step-through  pattern;Decreased stride length Gait velocity: Slow gait speed.  Encouraged patient to ambulate at speed to minimize dyspnea.           PT Diagnosis: Abnormality of gait;Difficulty walking;Generalized weakness  PT Problem List: Decreased activity tolerance;Decreased mobility;Cardiopulmonary status limiting activity;Obesity PT Treatment Interventions: Gait training;Functional mobility training;Patient/family education   PT Goals Acute Rehab PT Goals PT Goal Formulation: With patient/family Time For Goal Achievement: 09/18/12 Potential to Achieve Goals: Good Pt will Ambulate: >150 feet;Independently (with dyspnea < 2/4) PT Goal: Ambulate - Progress: Goal set today  Visit Information  Last PT Received On: 09/11/12 Assistance Needed: +1    Subjective Data  Subjective: "I just get so out of breath when I walk" Patient Stated Goal: To return home   Prior Functioning  Home Living Lives With: Spouse Available Help at Discharge: Family;Available 24 hours/day Type of Home: House Home Access: Stairs to enter [employer] of Steps: 1 Entrance Stairs-Rails: None Home Layout: One level Bathroom Shower/Tub: Forensic scientist: Standard Bathroom Accessibility: Yes How Accessible: Accessible via walker Home Adaptive Equipment: Walker - rolling;Shower chair with back;Grab bars in shower Prior Function Level of Independence: Independent;Needs assistance Needs Assistance: Meal Prep;Light Housekeeping Meal Prep: Total Light Housekeeping: Total Able to Take Stairs?: Yes (Dyspnea) Driving: Yes 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) Communication Communication: HOH Dominant Hand: Right    Cognition  Overall Cognitive Status: Appears within functional limits for tasks assessed/performed Arousal/Alertness: Awake/alert Orientation Level: Oriented X4 / Intact Behavior During Session: [facility] for tasks performed    Extremity/Trunk Assessment  Right Upper Extremity Assessment RUE ROM/Strength/Tone: WFL for tasks assessed RUE Sensation: WFL - Light Touch RUE Coordination: Deficits RUE Coordination Deficits: Decreased fine motor movement from prior CVA Left Upper  Extremity Assessment LUE ROM/Strength/Tone: WFL for tasks assessed LUE Sensation: WFL - Light Touch Right Lower Extremity Assessment RLE ROM/Strength/Tone: WFL for tasks assessed RLE Sensation: WFL - Light Touch Left Lower Extremity Assessment LLE ROM/Strength/Tone: WFL for tasks assessed LLE Sensation: WFL - Light Touch   Balance    End of Session PT - End of Session Activity Tolerance: Patient limited by fatigue (Dyspnea 4/4 with gait) Patient left: in chair;with call bell/phone within reach;with nursing in room Nurse Communication: Mobility status;Other (comment) (Dyspnea 4/4 with O2 sat 96%)  GP     Vena Austria 09/11/2012, 4:11 PM Durenda Hurt. Renaldo Fiddler, [facility] Acute Rehab Services Pager [phone]

## 2012-09-11 NOTE — Progress Notes (Signed)
  Echocardiogram 2D Echocardiogram has been performed.  Raymond Chaney 09/11/2012, 8:51 AM

## 2012-09-12 LAB — POTASSIUM: Potassium: 4.1 mEq/L (ref 3.5–5.1)

## 2012-09-12 LAB — PROTIME-INR
INR: 2.2 — ABNORMAL HIGH (ref 0.00–1.49)
Prothrombin Time: 23.5 seconds — ABNORMAL HIGH (ref 11.6–15.2)

## 2012-09-12 LAB — GLUCOSE, CAPILLARY

## 2012-09-12 MED ORDER — FOLIC ACID 1 MG PO TABS: 1.0000 mg | ORAL_TABLET | Freq: Every day | ORAL | Status: DC

## 2012-09-12 MED ORDER — THIAMINE HCL 100 MG PO TABS: 100.0000 mg | ORAL_TABLET | Freq: Every day | ORAL | Status: DC

## 2012-09-12 MED ORDER — METOPROLOL TARTRATE 50 MG PO TABS: 50.0000 mg | ORAL_TABLET | Freq: Every day | ORAL | Status: DC

## 2012-09-12 MED ORDER — LEVOFLOXACIN 500 MG PO TABS: 500.0000 mg | ORAL_TABLET | Freq: Every day | ORAL | Status: DC

## 2012-09-12 NOTE — Discharge Instructions (Signed)
Follow with Primary MD Hoyle Sauer, MD in 3 days and get a cardiologist in one week  Get CBC, CMP, INR checked 3 days by Primary MD and again as instructed by your Primary MD. Get a 2 view Chest X ray done next visit .  Get Medicines reviewed and adjusted.  Please request your Prim.MD to go over all Hospital Tests and Procedure/Radiological results at the follow up, please get all Hospital records sent to your Prim MD by signing hospital release before you go home.  Activity: As tolerated with Full fall precautions use walker/cane & assistance as needed   Diet:  Heart healthy-low carbohydrate, Fluid restriction 1.8 lit/day, Aspiration precautions.  Check your Weight same time everyday, if you gain over 2 pounds, or you develop in leg swelling, experience more shortness of breath or chest pain, call your Primary MD immediately. Follow Cardiac Low Salt Diet and 1.8 lit/day fluid restriction.  Disposition Home   If you experience worsening of your admission symptoms, develop shortness of breath, life threatening emergency, suicidal or homicidal thoughts you must seek medical attention immediately by calling 911 or calling your MD immediately  if symptoms less severe.  You Must read complete instructions/literature along with all the possible adverse reactions/side effects for all the Medicines you take and that have been prescribed to you. Take any new Medicines after you have completely understood and accpet all the possible adverse reactions/side effects.   Do not drive and provide baby sitting services if your were admitted for syncope or siezures until you have seen by Primary MD or a Neurologist and advised to do so again.  Do not drive when taking Pain medications.    Do not take more than prescribed Pain, Sleep and Anxiety Medications  Special Instructions: If you have smoked or chewed Tobacco  in the last 2 yrs please stop smoking, stop any regular Alcohol  and or any  Recreational drug use.  Wear Seat belts while driving.

## 2012-09-12 NOTE — Discharge Summary (Signed)
[facility]                                                                                   Raymond Chaney, is a 79 y.o. male  DOB 24-May-1932  MRN [id number].  Admission date:  09/08/2012  Discharge Date:  09/12/2012  Primary MD  Raymond Sauer, MD  Admitting Physician  Raymond Brooking, MD  Admission Diagnosis  Epididymitis [604.90] Nonspecific chest pain [786.50] UTI lower urinary tract infection  Discharge Diagnosis     Principal Problem:  *Epididymitis Active Problems:  UTI (lower urinary tract infection)  Atypical chest pain  Atrial fibrillation  Diabetes mellitus type 2 in obese   Past Medical History  Diagnosis Date  . Arthritis   . CHF (congestive heart failure)   . COPD (chronic obstructive pulmonary disease)   . Diabetes mellitus without complication   . Hypertension   . Stroke   . Gout   . Alcohol abuse   . GIB (gastrointestinal bleeding)   . Complication of anesthesia     2004   stopped breathing  3 times  on the table "  . Shortness of breath   . Sleep apnea 09/08/2012    Newly diagnosed  . Atrial fibrillation   . DVT, lower extremity      2005    Past Surgical History  Procedure Date  . Total knee arthroplasty   . Colon surgery     colon polyps  . Hip surgery      Recommendations for primary care physician for things to follow:   Please follow the patient's CBC BMP and INR closely, he must follow with his cardiologist in the next 7-10 days.   Discharge Diagnoses:   Principal Problem:  *Epididymitis Active Problems:  UTI (lower urinary tract infection)  Atypical chest pain  Atrial fibrillation  Diabetes mellitus type 2 in obese    Discharge Condition: Stable   Diet recommendation: See Discharge Instructions below   Consults urology, cardiology Dr. Patty Chaney over the phone   History of present illness and  Hospital Course:  See H&P, Labs, Consult and Test reports for all details in brief,  patient was admitted for scrotal/testicular pain due to right-sided orchitis, he was seen by urology here, was placed and prickly on Levaquin with good results, ultrasound revealed right-sided orchitis and after a few days of Levaquin his pain is resolved and clinically he feels much better. He will follow outpatient with urologist in a week's time, he will get total of 14 days of Levaquin. His urine culture was inconclusive.    He had atypical chest pain upon admission, serial cardiac enzymes were negative, echo showed EF of 40-45%, no wall motion abnormality, he had 10 beat nonsustained V. tach with out any symptoms few days ago, electrolytes were stable, he is already on beta blocker and ACE inhibitor, symptom-free, he will follow outpatient with his primary cardiologist Raymond Chaney.    He has chronic systolic and diastolic heart failure from that standpoint he is compensated. Her is intermittent atrial fibrillation he continue his beta blocker, is already on Coumadin with elevated INR.   Patient has diabetes mellitus  type 2. Sugars were stable, he will resume his home dose Glucophage. He does consume a lot of alcohol and was requested to reduce his intake, he says currently he is drinking one to 2 bottles of beer however Y. says in the past he is consumed large amounts. No evidence of DTs this admission.   He had 2 second pause on telemetry initially when he was admitted, he was on a combination of Cardizem along with beta blocker, Cardizem has been stopped, on beta blocker present dose his heart rate is in good control without any pauses.    Today   Subjective:   Raymond Chaney today has no headache,no chest abdominal pain,no new weakness tingling or numbness, feels much better wants to go home today. Does not feel any palpitations, scrotal pain has completely resolved.   Objective:   Blood pressure 135/72, pulse 83, temperature 98.2 F (36.8 C), temperature source Oral,  resp. rate 18, height 6' (1.829 m), weight 121.201 kg (267 lb 3.2 oz), SpO2 98.00%.   Intake/Output Summary (Last 24 hours) at 09/12/12 0958 Last data filed at 09/12/12 0509  Gross per 24 hour  Intake    660 ml  Output    725 ml  Net    -65 ml    Exam Awake Alert, Oriented *3, No new F.N deficits, Normal affect [place].AT,PERRAL Supple Neck,No JVD, No cervical lymphadenopathy appriciated.  Symmetrical Chest wall movement, Good air movement bilaterally, CTAB RRR,No Gallops,Rubs or new Murmurs, No Parasternal Heave +ve B.Sounds, Abd Soft, Non tender, No organomegaly appriciated, No rebound -guarding or rigidity. No Cyanosis, Clubbing or edema, No new Rash or bruise  Data Review   Major procedures and Radiology Reports - PLEASE review detailed and final reports for all details in brief -    Echocardiogram  - Left ventricle: The cavity size was mildly dilated. Systolic function was mildly to moderately reduced. The estimated ejection fraction was in the range of 40% to 45%.  Atrialfibrillation limits evaluation of LV diastolic function. E/e' ratio suggests moderate diastolic dysfunction and elevated mean left atrial pressure. - Left atrium: The atrium was moderately to severely dilated. - Right atrium: The atrium was mildly dilated. - Pulmonary arteries: Systolic pressure was moderately increased. PA peak pressure: 47mm Hg (S).     US Scrotum  09/09/2012  *RADIOLOGY REPORT*  Clinical Data:  Right testicular pain.  Suspected epididymitis. Rule out torsion.  SCROTAL ULTRASOUND DOPPLER ULTRASOUND OF THE TESTICLES  Technique: Complete ultrasound examination of the testicles, epididymis, and other scrotal structures was performed.  Color and spectral Doppler ultrasound were also utilized to evaluate blood flow to the testicles.  Comparison:  None  Findings:  Right testis:  5.3 x 3.9 x 4.3 cm.  Increased vascularity/color Doppler signal.  Normal gray scale appearance.  Left testis:  4.3 x 3.1 x  2.9 cm.  Dilatation of the rete testes is a normal variant laterally.  Normal gray scale and color Doppler appearance.  Right epididymis:  Tiny cyst versus spermatocele.  No definite increased vascularity.  Left epididymis:  Normal in size and appearance.  Hydrocele:  Right-sided small to moderate, with complexity.  Varicocele:  Absent  Pulsed Doppler interrogation of both testes demonstrates low resistance flow bilaterally.  IMPRESSION:  1.  Findings of right-sided orchitis. 2.  No evidence of testicular torsion. 3.  Mildly complex right-sided hydrocele, likely secondary.   Original Report Authenticated By: Jeronimo Greaves, M.D.    [place] Art/ven Flow Abd Pelv Doppler  09/09/2012  *  RADIOLOGY REPORT*  Clinical Data:  Right testicular pain.  Suspected epididymitis. Rule out torsion.  SCROTAL ULTRASOUND DOPPLER ULTRASOUND OF THE TESTICLES  Technique: Complete ultrasound examination of the testicles, epididymis, and other scrotal structures was performed.  Color and spectral Doppler ultrasound were also utilized to evaluate blood flow to the testicles.  Comparison:  None  Findings:  Right testis:  5.3 x 3.9 x 4.3 cm.  Increased vascularity/color Doppler signal.  Normal gray scale appearance.  Left testis:  4.3 x 3.1 x 2.9 cm.  Dilatation of the rete testes is a normal variant laterally.  Normal gray scale and color Doppler appearance.  Right epididymis:  Tiny cyst versus spermatocele.  No definite increased vascularity.  Left epididymis:  Normal in size and appearance.  Hydrocele:  Right-sided small to moderate, with complexity.  Varicocele:  Absent  Pulsed Doppler interrogation of both testes demonstrates low resistance flow bilaterally.  IMPRESSION:  1.  Findings of right-sided orchitis. 2.  No evidence of testicular torsion. 3.  Mildly complex right-sided hydrocele, likely secondary.   Original Report Authenticated By: Jeronimo Greaves, M.D.    Dg Chest Portable 1 View  09/08/2012  *RADIOLOGY REPORT*  Clinical Data: Chest  pain.  PORTABLE CHEST - 1 VIEW  Comparison: 01/09/2004.  Findings: The heart is mildly enlarged but stable.  The mediastinal and hilar contours are stable.  Minimal streaky bibasilar atelectasis but no infiltrates, edema or effusions.  The bony thorax is intact.  IMPRESSION: Streaky bibasilar atelectasis but no definite infiltrates or effusions.   Original Report Authenticated By: Rudie Meyer, M.D.     Micro Results     Recent Results (from the past 240 hour(s))  URINE CULTURE     Status: Normal   Collection Time   09/08/12  3:10 PM      Component Value Range Status Comment   Specimen Description URINE, CLEAN CATCH   Final    Special Requests NONE   Final    Culture  Setup Time 09/08/2012 16:30   Final    Colony Count 8,000 COLONIES/ML   Final    Culture INSIGNIFICANT GROWTH   Final    Report Status 09/09/2012 FINAL   Final   CULTURE, BLOOD (ROUTINE X 2)     Status: Normal (Preliminary result)   Collection Time   09/08/12 10:00 PM      Component Value Range Status Comment   Specimen Description BLOOD RIGHT HAND   Final    Special Requests BOTTLES DRAWN AEROBIC AND ANAEROBIC 7CC   Final    Culture  Setup Time 09/09/2012 06:39   Final    Culture     Final    Value:        BLOOD CULTURE RECEIVED NO GROWTH TO DATE CULTURE WILL BE HELD FOR 5 DAYS BEFORE ISSUING A FINAL NEGATIVE REPORT   Report Status PENDING   Incomplete   CULTURE, BLOOD (ROUTINE X 2)     Status: Normal (Preliminary result)   Collection Time   09/08/12 10:15 PM      Component Value Range Status Comment   Specimen Description BLOOD LEFT ARM   Final    Special Requests BOTTLES DRAWN AEROBIC ONLY 10CC   Final    Culture  Setup Time 09/09/2012 06:39   Final    Culture     Final    Value:        BLOOD CULTURE RECEIVED NO GROWTH TO DATE CULTURE WILL BE HELD FOR 5 DAYS BEFORE ISSUING  A FINAL NEGATIVE REPORT   Report Status PENDING   Incomplete    Lab Results  Component Value Date   INR 2.20* 09/12/2012   INR 2.11*  09/11/2012   INR 2.26* 09/10/2012     CBC w Diff: Lab Results  Component Value Date   WBC 8.9 09/11/2012   HGB 11.3* 09/11/2012   HCT 34.7* 09/11/2012   PLT 154 09/11/2012    CMP: Lab Results  Component Value Date   NA 140 09/11/2012   K 4.1 09/12/2012   CL 104 09/11/2012   CO2 29 09/11/2012   BUN 19 09/11/2012   CREATININE 0.93 09/11/2012  .   Discharge Instructions     Follow with Primary MD Raymond Sauer, MD in 3 days and get a cardiologist in one week  Get CBC, CMP, INR checked 3 days by Primary MD and again as instructed by your Primary MD. Get a 2 view Chest X ray done next visit .  Get Medicines reviewed and adjusted.  Please request your Prim.MD to go over all Hospital Tests and Procedure/Radiological results at the follow up, please get all Hospital records sent to your Prim MD by signing hospital release before you go home.  Activity: As tolerated with Full fall precautions use walker/cane & assistance as needed   Diet:  Heart healthy-low carbohydrate, Fluid restriction 1.8 lit/day, Aspiration precautions.  Check your Weight same time everyday, if you gain over 2 pounds, or you develop in leg swelling, experience more shortness of breath or chest pain, call your Primary MD immediately. Follow Cardiac Low Salt Diet and 1.8 lit/day fluid restriction.  Disposition Home   If you experience worsening of your admission symptoms, develop shortness of breath, life threatening emergency, suicidal or homicidal thoughts you must seek medical attention immediately by calling 911 or calling your MD immediately  if symptoms less severe.  You Must read complete instructions/literature along with all the possible adverse reactions/side effects for all the Medicines you take and that have been prescribed to you. Take any new Medicines after you have completely understood and accpet all the possible adverse reactions/side effects.   Do not drive and provide baby sitting services if your were  admitted for syncope or siezures until you have seen by Primary MD or a Neurologist and advised to do so again.  Do not drive when taking Pain medications.    Do not take more than prescribed Pain, Sleep and Anxiety Medications  Special Instructions: If you have smoked or chewed Tobacco  in the last 2 yrs please stop smoking, stop any regular Alcohol  and or any Recreational drug use.  Wear Seat belts while driving.   Follow-up Information    Follow up with Raymond Sauer, MD. Schedule an appointment as soon as possible for a visit in 3 days.   Contact information:   [address] [facility] [place] [address] [phone]       Follow up with Runell Gess, MD. Schedule an appointment as soon as possible for a visit in 1 week.   Contact information:   [address] [place] [address] [phone]       Follow up with Garnett Farm, MD. Schedule an appointment as soon as possible for a visit in 1 week.   Contact information:   [address] [place] [address] [phone]            Discharge Medications     Medication List  As of 09/12/2012  9:58 AM    START taking these medications         folic acid 1 MG tablet   Commonly known as: FOLVITE   Take 1 tablet (1 mg total) by mouth daily.      levofloxacin 500 MG tablet   Commonly known as: LEVAQUIN   Take 1 tablet (500 mg total) by mouth daily.      thiamine 100 MG tablet   Take 1 tablet (100 mg total) by mouth daily.      CHANGE how you take these medications         metoprolol 50 MG tablet   Commonly known as: LOPRESSOR   Take 1 tablet (50 mg total) by mouth daily.   What changed: - medication strength - dose      CONTINUE taking these medications         allopurinol 100 MG tablet   Commonly known as: ZYLOPRIM      doxazosin 4 MG tablet   Commonly known as: CARDURA      gabapentin 300 MG capsule   Commonly known as: NEURONTIN        lisinopril 10 MG tablet   Commonly known as: PRINIVIL,ZESTRIL      metFORMIN 1000 MG tablet   Commonly known as: GLUCOPHAGE      potassium chloride SA 20 MEQ tablet   Commonly known as: K-DUR,KLOR-Raymond      pravastatin 80 MG tablet   Commonly known as: PRAVACHOL      warfarin 5 MG tablet   Commonly known as: COUMADIN      STOP taking these medications         diltiazem 240 MG 24 hr capsule   Commonly known as: DILACOR XR          Where to get your medications    These are the prescriptions that you need to pick up. We sent them to a specific pharmacy, so you will need to go there to get them.   [facility] [address] [place], [place] - [address]    [address] [place] [address]    Phone: [phone]        levofloxacin 500 MG tablet   metoprolol 50 MG tablet         Information on where to get these meds is not yet available. Ask your nurse or doctor.         folic acid 1 MG tablet   thiamine 100 MG tablet               Total Time in preparing paper work, data evaluation and todays exam - 35 minutes  Raymond Chaney M.D on 09/12/2012 at 9:58 AM  [facility] Office  [phone]

## 2012-09-15 LAB — CULTURE, BLOOD (ROUTINE X 2)
Culture: NO GROWTH
Culture: NO GROWTH

## 2012-11-26 ENCOUNTER — Ambulatory Visit: Payer: Self-pay | Admitting: Cardiovascular Disease

## 2012-11-26 DIAGNOSIS — Z7901 Long term (current) use of anticoagulants: Secondary | ICD-10-CM

## 2013-02-04 ENCOUNTER — Ambulatory Visit: Payer: Medicare Other | Admitting: Pharmacist Clinician (PhC)/ Clinical Pharmacy Specialist

## 2013-02-08 ENCOUNTER — Encounter: Payer: Self-pay | Admitting: *Deleted

## 2013-02-09 ENCOUNTER — Ambulatory Visit (INDEPENDENT_AMBULATORY_CARE_PROVIDER_SITE_OTHER): Payer: Medicare Other | Admitting: Pharmacist Clinician (PhC)/ Clinical Pharmacy Specialist

## 2013-02-09 ENCOUNTER — Encounter: Payer: Self-pay | Admitting: Cardiology

## 2013-02-09 ENCOUNTER — Ambulatory Visit (INDEPENDENT_AMBULATORY_CARE_PROVIDER_SITE_OTHER): Payer: Medicare Other | Admitting: Cardiology

## 2013-02-09 VITALS — BP 124/68 | HR 68 | Ht 72.0 in | Wt 270.0 lb

## 2013-02-09 DIAGNOSIS — I4891 Unspecified atrial fibrillation: Secondary | ICD-10-CM

## 2013-02-09 DIAGNOSIS — Z79899 Other long term (current) drug therapy: Secondary | ICD-10-CM

## 2013-02-09 DIAGNOSIS — I2510 Atherosclerotic heart disease of native coronary artery without angina pectoris: Secondary | ICD-10-CM

## 2013-02-09 DIAGNOSIS — Z7901 Long term (current) use of anticoagulants: Secondary | ICD-10-CM

## 2013-02-09 DIAGNOSIS — R0602 Shortness of breath: Secondary | ICD-10-CM

## 2013-02-09 MED ORDER — FUROSEMIDE 20 MG PO TABS: ORAL_TABLET | ORAL | Status: DC

## 2013-02-09 NOTE — Progress Notes (Signed)
[facility]  02/09/2013   PCP: Hoyle Sauer, MD   Chief Complaint  Patient presents with  . rov    sob    Primary Cardiologist:Dr. Erlene Quan   HPI: 79 year old mildly overweight married Caucasian male, father of 3 and grandfather of 6 grandchildren, last seen by Dr. Allyson Sabal in the office January of this year.  He has a history of moderate CAD by catheterization performed by Dr. 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 His other problems include COPD with remote tobacco abuse, having quit 20 years ago, with dyspnea on exertion. He has hypertension, hyperlipidemia, insulin-dependent diabetes, and chronic atrial fibrillation, on Coumadin anticoagulation. He does have diabetic peripheral neuropathy by symptoms. His last Myoview, performed October 19, 2009, showed diaphragmatic attenuation versus inferior scar without ischemia. He has an excellent lipid profile performed by Dr. Felipa Eth for secondary prevention.  He is followed with obstructive sleep apnea by Dr. Tresa Endo.  Unfortunately the patient is unable to wear the CPAP.  Patient is here for increasing shortness of breath.  Mostly with exertion and his weight is up 5 pounds from his last visit. He is also concerned that he was told he needed oxygen at night since she cannot wear CPAP we have contacted Dr. Michel Harrow nurse to assist with that and she will call him later in the week.   He denies any chest pain.       Allergies  Allergen Reactions  . Penicillins     Current Outpatient Prescriptions  Medication Sig Dispense Refill  . allopurinol (ZYLOPRIM) 100 MG tablet Take 100 mg by mouth daily.      Marland Kitchen doxazosin (CARDURA) 4 MG tablet Take 4 mg by mouth daily.      Marland Kitchen lisinopril (PRINIVIL,ZESTRIL) 10 MG tablet Take 10 mg by mouth daily.      . metFORMIN (GLUCOPHAGE) 1000 MG tablet Take 1,000 mg by mouth daily with breakfast.      . metoprolol tartrate (LOPRESSOR) 50 MG  tablet Take 1 tablet (50 mg total) by mouth daily.  60 tablet  0  . MYRBETRIQ 50 MG TB24 Take 50 mg by mouth every other day.      . potassium chloride SA (K-DUR,KLOR-CON) 20 MEQ tablet Take 20 mEq by mouth daily.      . pravastatin (PRAVACHOL) 80 MG tablet Take 80 mg by mouth daily.      Marland Kitchen triamcinolone cream (KENALOG) 0.1 % as needed.      . warfarin (COUMADIN) 5 MG tablet Take 5 mg by mouth every morning.      . furosemide (LASIX) 20 MG tablet Take two tablets daily for 5 days, then one tablet daily  35 tablet  6   No current facility-administered medications for this visit.    Past Medical History  Diagnosis Date  . Arthritis   . CHF (congestive heart failure)   . COPD (chronic obstructive pulmonary disease)   . Diabetes mellitus without complication   . Hypertension   . Stroke   . Gout   . Alcohol abuse   . GIB (gastrointestinal bleeding)   . Complication of anesthesia     2004   stopped breathing  3 times  on the table "  . Shortness of breath   . Sleep apnea 09/08/2012    sleep study 08/20/12-ild sleep apnea with an AHI of 7.8/hr and durimg REM 27.2/hr: CPAP 10/04/12-tried nasal pillows and face mask, but couldnt tolerate  .  Atrial fibrillation     chronic on coumadin; monitor 05/20/11- AFib    . DVT, lower extremity      2005  . Hyperlipidemia   . CAD (coronary artery disease)     myoview 10/19/09-diaphragmatic attenuation vs inferior scar without ischemia; echo 09/11/12-Ef 40-45%, L atrium mod to severely dilated    Past Surgical History  Procedure Laterality Date  . Total knee arthroplasty    . Colon surgery      colon polyps  . Hip surgery    . 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 Cardiac catheterization  10/11/02    noncritical CAD, EF 45-50%  . Cardiac catheterization  08/22/99    no significant CAD, nl EF    ZOX:WRUEAVW:UJ colds or fevers, no weight changes Skin:no rashes or ulcers HEENT:no  blurred vision, no congestion CV:see HPI PUL:see HPI GI:no diarrhea constipation or melena, no indigestion GU:no hematuria, no dysuria MS:no joint pain, no claudication Neuro:no syncope, no lightheadedness Endo:+ diabetes, no thyroid disease   PHYSICAL EXAM BP 124/68  Pulse 68  Ht 6' (1.829 m)  Wt 270 lb (122.471 kg)  BMI 36.61 kg/m2 General:Pleasant affect, NAD Skin:Warm and dry, brisk capillary refill HEENT:normocephalic, sclera clear, mucus membranes moist Neck:supple, no JVD, no bruits  Heart:Irreg irreg, without murmur, gallup, rub or click Lungs:clear without rales, rhonchi, or wheezes WJX:BJYN, non tender, + BS, do not palpate liver spleen or masses Ext:1+ lower ext edema, 2+ pedal pulses, 2+ radial pulses Neuro:alert and oriented, MAE, follows commands, + facial symmetry  ASSESSMENT AND PLAN SOB (shortness of breath), more uncomfortable  Increasing SOB, at night but worse with exertion.  Weight is up 5 pounds.  Pt also with sleep apnea but unable to tolerate CPAP.  Add lasix 40 mg daily X 5 days then decrease to 20 mg.  ? Anginal equivalent, with known CAD.  Last nuc study 3 years ago.  Will do [employer] And follow up with Dr. Allyson Sabal.  Pt has tried inhalers in the past without help.  Adding guaifenesin 1200 mg BID to see if this will help.   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  Atrial fibrillation stable  Long term (current) use of anticoagulants INR stable    Followup with Dr. Allyson Sabal once the test is complete for further evaluation and to ensure that his Volume overload is improved.

## 2013-02-09 NOTE — Assessment & Plan Note (Signed)
Increasing SOB, at night but worse with exertion.  Weight is up 5 pounds.  Pt also with sleep apnea but unable to tolerate CPAP.  Add lasix 40 mg daily X 5 days then decrease to 20 mg.  ? Anginal equivalent, with known CAD.  Last nuc study 3 years ago.  Will do [employer] And follow up with Dr. Allyson Sabal.  Pt has tried inhalers in the past without help.  Adding guaifenesin 1200 mg BID to see if this will help.

## 2013-02-09 NOTE — Assessment & Plan Note (Signed)
INR stable

## 2013-02-09 NOTE — Assessment & Plan Note (Signed)
Lexiscan myoview, this may be anginal equivalent.

## 2013-02-09 NOTE — Patient Instructions (Addendum)
We will add Lasix (furosemide) to help with fluid.  Take two tablets every day for the next 5 days, then decrease to one tablet daily.  Have bloodwork done in the next couple of days.  We will do a lexiscan myoview to make sure your increased shortness of breath is not related to heart disease.  Add guaifenesin 1200 mg twice a day, hopefully it will help congestion. You can buy this over the counter. [facility] on [address] may be the cheapest place to purchase the mucinex (guaifenesin))  I have notified Dr. Landry Dyke Nurse- Burna Mortimer about home oxygen.  She should call in 1-2 days.  Call if SOB increases.  Will have you follow up with Dr. Allyson Sabal for results of test.

## 2013-02-09 NOTE — Assessment & Plan Note (Signed)
stable °

## 2013-02-15 ENCOUNTER — Encounter: Payer: Self-pay | Admitting: Cardiovascular Disease

## 2013-02-16 ENCOUNTER — Ambulatory Visit (HOSPITAL_COMMUNITY)
Admission: RE | Admit: 2013-02-16 | Discharge: 2013-02-16 | Disposition: A | Discharge status: Home / Self Care | Payer: Medicare Other | Source: Ambulatory Visit | Attending: Cardiovascular Disease | Admitting: Cardiovascular Disease

## 2013-02-16 DIAGNOSIS — R0609 Other forms of dyspnea: Secondary | ICD-10-CM | POA: Insufficient documentation

## 2013-02-16 DIAGNOSIS — R0989 Other specified symptoms and signs involving the circulatory and respiratory systems: Secondary | ICD-10-CM | POA: Insufficient documentation

## 2013-02-16 DIAGNOSIS — R0602 Shortness of breath: Secondary | ICD-10-CM | POA: Insufficient documentation

## 2013-02-16 DIAGNOSIS — E119 Type 2 diabetes mellitus without complications: Secondary | ICD-10-CM | POA: Insufficient documentation

## 2013-02-16 DIAGNOSIS — I10 Essential (primary) hypertension: Secondary | ICD-10-CM | POA: Insufficient documentation

## 2013-02-16 DIAGNOSIS — I2510 Atherosclerotic heart disease of native coronary artery without angina pectoris: Secondary | ICD-10-CM

## 2013-02-16 DIAGNOSIS — R079 Chest pain, unspecified: Secondary | ICD-10-CM | POA: Insufficient documentation

## 2013-02-16 DIAGNOSIS — R5381 Other malaise: Secondary | ICD-10-CM | POA: Insufficient documentation

## 2013-02-16 MED ORDER — TECHNETIUM TC 99M SESTAMIBI GENERIC - CARDIOLITE
10.3000 | Freq: Once | INTRAVENOUS | Status: AC | PRN
  Administered 2013-02-16: 10 via INTRAVENOUS

## 2013-02-16 MED ORDER — REGADENOSON 0.4 MG/5ML IV SOLN
0.4000 mg | Freq: Once | INTRAVENOUS | Status: AC
  Administered 2013-02-16: 0.4 mg via INTRAVENOUS

## 2013-02-16 MED ORDER — AMINOPHYLLINE 25 MG/ML IV SOLN
75.0000 mg | Freq: Once | INTRAVENOUS | Status: AC
  Administered 2013-02-16: 75 mg via INTRAVENOUS

## 2013-02-16 MED ORDER — TECHNETIUM TC 99M SESTAMIBI GENERIC - CARDIOLITE
32.4000 | Freq: Once | INTRAVENOUS | Status: AC | PRN
  Administered 2013-02-16: 32 via INTRAVENOUS

## 2013-02-16 NOTE — Procedures (Addendum)
[facility] [address] [place] [address] [phone]  Cardiology Nuclear Med Study  Raymond Chaney is a 79 y.o. male     MRN : [id number]     DOB: [birth year]  Procedure Date: 02/16/2013  Nuclear Med Background Indication for Stress Test:  Evaluation for Ischemia History:  COPD and CAD Cardiac Risk Factors: CVA, Family History - CAD, History of Smoking, Hypertension, Lipids, NIDDM and Obesity  Symptoms:  Chest Pain, DOE, Fatigue and SOB   Nuclear Pre-Procedure Caffeine/Decaff Intake:  7:00pm NPO After: 5:00am   IV Site: R Forearm  IV 0.9% NS with Angio Cath:  22g  Chest Size (in):  46"  IV Started by: Emmit Pomfret, RN  Height: 6' (1.829 m)  Cup Size: n/a  BMI:  Body mass index is 36.61 kg/(m^2). Weight:  270 lb (122.471 kg)   Tech Comments:  N/A    Nuclear Med Study 1 or 2 day study: 1 day  Stress Test Type:  Lexiscan  Order Authorizing Provider:  Nanetta Batty, MD   Resting Radionuclide: Technetium 64m Sestamibi  Resting Radionuclide Dose: 10.3 mCi   Stress Radionuclide:  Technetium 60m Sestamibi  Stress Radionuclide Dose: 32.4 mCi           Stress Protocol Rest HR:85 Stress HR: 97  Rest BP: 137/68 Stress BP:120/61  Exercise Time (min): n/a METS: n/a          Dose of Adenosine (mg):  n/a Dose of Lexiscan: 0.4 mg  Dose of Atropine (mg): n/a Dose of Dobutamine: n/a mcg/kg/min (at max HR)  Stress Test Technologist: Ernestene Mention, CCT Nuclear Technologist: Gonzella Lex, CNMT   Rest Procedure:  Myocardial perfusion imaging was performed at rest 45 minutes following the intravenous administration of Technetium 43m Sestamibi. Stress Procedure:  The patient received IV Lexiscan 0.4 mg over 15-seconds.  Technetium 37m Sestamibi injected at 30-seconds.  Due to patient's shortness of breath he was given IV Aminophylline 75 mg. Symptom was resolved during recovery.There were no significant changes with  Lexiscan.  Quantitative spect images were obtained after a 45 minute delay.  Transient Ischemic Dilatation (Normal <1.22):  1.14 Lung/Heart Ratio (Normal <0.45):  0.35 QGS EDV:  n/a ml QGS ESV:  n/a ml LV Ejection Fraction: Study not gated  Signed by       Rest ECG: Atrial Fibrilliation  Stress ECG: Atrial Fibrillation with frquent PVC's, couplet and triplet  QPS Raw Data Images:  Normal; no motion artifact; normal heart/lung ratio. Stress Images:  Small in size, moderate ion intensity mid distal inferior defect Rest Images:  Comparison with the stress images reveals no significant change although there is additional mid to basal inferior attenuation. Subtraction (SDS):  No evidence of ischemia.  Impression Exercise Capacity:  Lexiscan with no exercise. BP Response:  Normal blood pressure response. Clinical Symptoms:  Mild shortness of breath ECG Impression:  No significant ST segment change suggestive of ischemia but occasional PVC's with couplet and triplet. Comparison with Prior Nuclear Study: No images to compare  Overall Impression:  Low risk stress nuclear study demonstrating focal mid-distal inferior scar..  LV Wall Motion:  Not gated.   Lennette Bihari, MD  02/16/2013 7:10 PM

## 2013-03-03 ENCOUNTER — Ambulatory Visit: Payer: Medicare Other | Admitting: Pharmacist Clinician (PhC)/ Clinical Pharmacy Specialist

## 2013-03-05 ENCOUNTER — Ambulatory Visit (INDEPENDENT_AMBULATORY_CARE_PROVIDER_SITE_OTHER): Payer: Medicare Other | Admitting: Pharmacist Clinician (PhC)/ Clinical Pharmacy Specialist

## 2013-03-05 VITALS — BP 100/58 | HR 68

## 2013-03-05 DIAGNOSIS — Z7901 Long term (current) use of anticoagulants: Secondary | ICD-10-CM

## 2013-03-05 DIAGNOSIS — I4891 Unspecified atrial fibrillation: Secondary | ICD-10-CM

## 2013-03-24 ENCOUNTER — Ambulatory Visit (INDEPENDENT_AMBULATORY_CARE_PROVIDER_SITE_OTHER): Payer: Medicare Other | Admitting: Cardiovascular Disease

## 2013-03-24 ENCOUNTER — Encounter: Payer: Self-pay | Admitting: Cardiovascular Disease

## 2013-03-24 ENCOUNTER — Ambulatory Visit (INDEPENDENT_AMBULATORY_CARE_PROVIDER_SITE_OTHER): Payer: Medicare Other | Admitting: Pharmacist Clinician (PhC)/ Clinical Pharmacy Specialist

## 2013-03-24 VITALS — BP 100/54 | HR 68

## 2013-03-24 VITALS — BP 98/56 | HR 72 | Ht 72.0 in | Wt 268.0 lb

## 2013-03-24 DIAGNOSIS — I4891 Unspecified atrial fibrillation: Secondary | ICD-10-CM

## 2013-03-24 DIAGNOSIS — Z7901 Long term (current) use of anticoagulants: Secondary | ICD-10-CM

## 2013-03-24 DIAGNOSIS — R0602 Shortness of breath: Secondary | ICD-10-CM

## 2013-03-24 LAB — POCT INR: INR: 2.9

## 2013-03-24 NOTE — Assessment & Plan Note (Signed)
Mr. Bradt saw a Nada Boozer registered nurse practitioner in the office 02/09/13. He does have a history of sleep apnea unfortunately is unable to wear CPAP. He now has nocturnal oxygen as able to breathe better and sleep better. A Myoview stress test showed inferobasal scar without ischemia. The study was not gated. The patient has chronic A. Fib. He does say that he does not feel any worse than he did when I saw him back in January. We will continue current medical therapy.

## 2013-03-24 NOTE — Assessment & Plan Note (Signed)
Rate controlled on Coumadin anticoagulation 

## 2013-03-24 NOTE — Patient Instructions (Addendum)
  Your physician wants you to follow-up in: 6 months with an extender Vernona Rieger) and 12 months with Dr Allyson Sabal. You will receive a reminder letter in the mail two months in advance. If you don't receive a letter, please call our office to schedule the follow-up appointment.

## 2013-03-24 NOTE — Progress Notes (Signed)
03/24/2013 Raymond Chaney   [birth year]  [id number]  Primary Physician Raymond Sauer, MD Primary Cardiologist: Raymond Gess MD Raymond Chaney   HPI:  79 year old mildly overweight married Caucasian male, father of 3 and grandfather of 6 grandchildren, last seen by Raymond Chaney in the office January of this year. He has a history of moderate CAD by catheterization performed by Dr. 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 His other problems include COPD with remote tobacco abuse, having quit 20 years ago, with dyspnea on exertion. He has hypertension, hyperlipidemia, insulin-dependent diabetes, and chronic atrial fibrillation, on Coumadin anticoagulation. He does have diabetic peripheral neuropathy by symptoms. His last Myoview, performed October 19, 2009, showed diaphragmatic attenuation versus inferior scar without ischemia. He has an excellent lipid profile performed by Dr. Felipa Chaney for secondary prevention. He is followed with obstructive sleep apnea by Dr. Tresa Chaney. Unfortunately the patient is unable to wear the CPAP.  Patient is here for increasing shortness of breath. Mostly with exertion and his weight is up 5 pounds from his last visit. He is also concerned that he was told he needed oxygen at night since she cannot wear CPAP we have contacted Dr. Michel Chaney nurse to assist with that and she will call him later in the week. He denies any chest pain.   Raymond Chaney returns today for followup. He had a Myoview stress test done to determine whether his shortness of breath was not an anginal equivalent". This came back low risk with inferobasal scar. He says his breathing has improved since he wears nocturnal oxygen.     Current Outpatient Prescriptions  Medication Sig Dispense Refill  . allopurinol (ZYLOPRIM) 100 MG tablet Take 100 mg by mouth daily.      Marland Kitchen doxazosin (CARDURA) 4 MG tablet Take 4 mg by mouth daily.      . furosemide (LASIX) 20  MG tablet Take two tablets daily for 5 days, then one tablet daily  35 tablet  6  . lisinopril (PRINIVIL,ZESTRIL) 10 MG tablet Take 10 mg by mouth daily.      . metFORMIN (GLUCOPHAGE) 1000 MG tablet Take 1,000 mg by mouth daily with breakfast.      . metoprolol tartrate (LOPRESSOR) 50 MG tablet Take 1 tablet (50 mg total) by mouth daily.  60 tablet  0  . MYRBETRIQ 50 MG TB24 Take 50 mg by mouth every other day.      . potassium chloride SA (K-DUR,KLOR-CON) 20 MEQ tablet Take 20 mEq by mouth daily.      . pravastatin (PRAVACHOL) 80 MG tablet Take 80 mg by mouth daily.      Marland Kitchen triamcinolone cream (KENALOG) 0.1 % as needed.      . warfarin (COUMADIN) 5 MG tablet Take 5 mg by mouth every morning.       No current facility-administered medications for this visit.    Allergies  Allergen Reactions  . Penicillins     History   Social History  . Marital Status: Married    Spouse Name: N/A    Number of Children: N/A  . Years of Education: N/A   Occupational History  . Not on file.   Social History Main Topics  . Smoking status: Former Smoker    Quit date: 09/08/1984  . Smokeless tobacco: Former Neurosurgeon  . Alcohol Use: No     Comment: HX of Alcohol abuse    Quit  in 2004  . Drug Use: No  .  Sexually Active: Not on file   Other Topics Concern  . Not on file   Social History Narrative  . No narrative on file     Review of Systems: General: negative for chills, fever, night sweats or weight changes.  Cardiovascular: negative for chest pain, dyspnea on exertion, edema, orthopnea, palpitations, paroxysmal nocturnal dyspnea or shortness of breath Dermatological: negative for rash Respiratory: negative for cough or wheezing Urologic: negative for hematuria Abdominal: negative for nausea, vomiting, diarrhea, bright red blood per rectum, melena, or hematemesis Neurologic: negative for visual changes, syncope, or dizziness All other systems reviewed and are otherwise negative except as  noted above.    Blood pressure 98/56, pulse 72, height 6' (1.829 m), weight 268 lb (121.564 kg).  General appearance: alert and no distress Neck: no adenopathy, no carotid bruit, no JVD, supple, symmetrical, trachea midline and thyroid not enlarged, symmetric, no tenderness/mass/nodules Lungs: clear to auscultation bilaterally Heart: irregularly irregular rhythm Extremities: extremities normal, atraumatic, no cyanosis or edema  EKG not performed today  ASSESSMENT AND PLAN:   SOB (shortness of breath), more uncomfortable  [name] saw a Nada Boozer registered nurse practitioner in the office 02/09/13. He does have a history of sleep apnea unfortunately is unable to wear CPAP. He now has nocturnal oxygen as able to breathe better and sleep better. A Myoview stress test showed inferobasal scar without ischemia. The study was not gated. The patient has chronic A. Fib. He does say that he does not feel any worse than he did when I saw him back in January. We will continue current medical therapy.  Atrial fibrillation Rate controlled on Coumadin anticoagulation      Raymond Gess MD [facility], [facility] 03/24/2013 10:51 AM

## 2013-04-21 ENCOUNTER — Ambulatory Visit (INDEPENDENT_AMBULATORY_CARE_PROVIDER_SITE_OTHER): Payer: Medicare Other | Admitting: Pharmacist Clinician (PhC)/ Clinical Pharmacy Specialist

## 2013-04-21 ENCOUNTER — Ambulatory Visit: Payer: Medicare Other | Admitting: Pharmacist Clinician (PhC)/ Clinical Pharmacy Specialist

## 2013-04-21 VITALS — BP 110/56 | HR 76

## 2013-04-21 DIAGNOSIS — I4891 Unspecified atrial fibrillation: Secondary | ICD-10-CM

## 2013-04-21 DIAGNOSIS — Z7901 Long term (current) use of anticoagulants: Secondary | ICD-10-CM

## 2013-04-21 LAB — POCT INR: INR: 2.3

## 2013-05-19 ENCOUNTER — Ambulatory Visit: Payer: Medicare Other | Admitting: Pharmacist Clinician (PhC)/ Clinical Pharmacy Specialist

## 2013-05-21 ENCOUNTER — Ambulatory Visit (INDEPENDENT_AMBULATORY_CARE_PROVIDER_SITE_OTHER): Payer: Medicare Other | Admitting: Pharmacist Clinician (PhC)/ Clinical Pharmacy Specialist

## 2013-05-21 VITALS — BP 90/50 | HR 64

## 2013-05-21 DIAGNOSIS — Z7901 Long term (current) use of anticoagulants: Secondary | ICD-10-CM

## 2013-05-21 DIAGNOSIS — I4891 Unspecified atrial fibrillation: Secondary | ICD-10-CM

## 2013-05-21 LAB — POCT INR: INR: 1.4

## 2013-06-11 ENCOUNTER — Ambulatory Visit: Payer: Medicare Other | Admitting: Pharmacist Clinician (PhC)/ Clinical Pharmacy Specialist

## 2013-06-15 ENCOUNTER — Ambulatory Visit (INDEPENDENT_AMBULATORY_CARE_PROVIDER_SITE_OTHER): Payer: Medicare Other | Admitting: Pharmacist Clinician (PhC)/ Clinical Pharmacy Specialist

## 2013-06-15 VITALS — BP 112/56 | HR 84

## 2013-06-15 DIAGNOSIS — Z7901 Long term (current) use of anticoagulants: Secondary | ICD-10-CM

## 2013-06-15 DIAGNOSIS — I4891 Unspecified atrial fibrillation: Secondary | ICD-10-CM

## 2013-06-15 LAB — POCT INR: INR: 2.9

## 2013-07-12 ENCOUNTER — Ambulatory Visit: Payer: Medicare Other | Admitting: Pharmacist Clinician (PhC)/ Clinical Pharmacy Specialist

## 2013-07-13 ENCOUNTER — Ambulatory Visit: Payer: Medicare Other | Admitting: Pharmacist Clinician (PhC)/ Clinical Pharmacy Specialist

## 2013-07-14 ENCOUNTER — Ambulatory Visit (INDEPENDENT_AMBULATORY_CARE_PROVIDER_SITE_OTHER): Payer: Medicare Other | Admitting: Pharmacist Clinician (PhC)/ Clinical Pharmacy Specialist

## 2013-07-14 VITALS — BP 116/50 | HR 76

## 2013-07-14 DIAGNOSIS — Z7901 Long term (current) use of anticoagulants: Secondary | ICD-10-CM

## 2013-07-14 DIAGNOSIS — I4891 Unspecified atrial fibrillation: Secondary | ICD-10-CM

## 2013-07-14 LAB — POCT INR: INR: 2.9

## 2013-07-19 ENCOUNTER — Other Ambulatory Visit: Payer: Self-pay | Admitting: *Deleted

## 2013-07-19 MED ORDER — WARFARIN SODIUM 5 MG PO TABS: 5.0000 mg | ORAL_TABLET | Freq: Every morning | ORAL | Status: DC

## 2013-07-23 ENCOUNTER — Other Ambulatory Visit: Payer: Self-pay | Admitting: Pharmacist Clinician (PhC)/ Clinical Pharmacy Specialist

## 2013-07-23 MED ORDER — WARFARIN SODIUM 5 MG PO TABS: ORAL_TABLET | ORAL | Status: DC

## 2013-08-11 ENCOUNTER — Ambulatory Visit (INDEPENDENT_AMBULATORY_CARE_PROVIDER_SITE_OTHER): Payer: Medicare Other | Admitting: Pharmacist Clinician (PhC)/ Clinical Pharmacy Specialist

## 2013-08-11 VITALS — BP 110/58 | HR 68

## 2013-08-11 DIAGNOSIS — I4891 Unspecified atrial fibrillation: Secondary | ICD-10-CM

## 2013-08-11 DIAGNOSIS — Z7901 Long term (current) use of anticoagulants: Secondary | ICD-10-CM

## 2013-08-11 LAB — POCT INR: INR: 2.6

## 2013-08-19 ENCOUNTER — Telehealth: Payer: Self-pay | Admitting: Pharmacist Clinician (PhC)/ Clinical Pharmacy Specialist

## 2013-08-19 NOTE — Telephone Encounter (Signed)
Pt left message on VM.  Has run out of warfarin, mail order was shipped by never arrived.  Pt states mail order won't resend prior to 12/15.  Needs meds

## 2013-08-19 NOTE — Telephone Encounter (Signed)
Pt found meds.

## 2013-09-08 ENCOUNTER — Emergency Department (HOSPITAL_COMMUNITY): Payer: Medicare Other

## 2013-09-08 ENCOUNTER — Emergency Department (HOSPITAL_COMMUNITY)
Admission: EM | Admit: 2013-09-08 | Discharge: 2013-09-08 | Disposition: A | Discharge status: Home / Self Care | Payer: Medicare Other | Attending: Emergency Medicine | Admitting: Emergency Medicine

## 2013-09-08 ENCOUNTER — Ambulatory Visit: Payer: Medicare Other | Admitting: Pharmacist Clinician (PhC)/ Clinical Pharmacy Specialist

## 2013-09-08 ENCOUNTER — Encounter (HOSPITAL_COMMUNITY): Payer: Self-pay | Admitting: Emergency Medicine

## 2013-09-08 DIAGNOSIS — I10 Essential (primary) hypertension: Secondary | ICD-10-CM | POA: Insufficient documentation

## 2013-09-08 DIAGNOSIS — J4489 Other specified chronic obstructive pulmonary disease: Secondary | ICD-10-CM | POA: Insufficient documentation

## 2013-09-08 DIAGNOSIS — B9789 Other viral agents as the cause of diseases classified elsewhere: Secondary | ICD-10-CM | POA: Insufficient documentation

## 2013-09-08 DIAGNOSIS — Z880 Allergy status to penicillin: Secondary | ICD-10-CM | POA: Insufficient documentation

## 2013-09-08 DIAGNOSIS — E119 Type 2 diabetes mellitus without complications: Secondary | ICD-10-CM | POA: Insufficient documentation

## 2013-09-08 DIAGNOSIS — Z9981 Dependence on supplemental oxygen: Secondary | ICD-10-CM | POA: Insufficient documentation

## 2013-09-08 DIAGNOSIS — M109 Gout, unspecified: Secondary | ICD-10-CM | POA: Insufficient documentation

## 2013-09-08 DIAGNOSIS — K5289 Other specified noninfective gastroenteritis and colitis: Secondary | ICD-10-CM | POA: Insufficient documentation

## 2013-09-08 DIAGNOSIS — Z7901 Long term (current) use of anticoagulants: Secondary | ICD-10-CM | POA: Insufficient documentation

## 2013-09-08 DIAGNOSIS — Z86718 Personal history of other venous thrombosis and embolism: Secondary | ICD-10-CM | POA: Insufficient documentation

## 2013-09-08 DIAGNOSIS — I2510 Atherosclerotic heart disease of native coronary artery without angina pectoris: Secondary | ICD-10-CM | POA: Insufficient documentation

## 2013-09-08 DIAGNOSIS — G4730 Sleep apnea, unspecified: Secondary | ICD-10-CM | POA: Insufficient documentation

## 2013-09-08 DIAGNOSIS — Z8673 Personal history of transient ischemic attack (TIA), and cerebral infarction without residual deficits: Secondary | ICD-10-CM | POA: Insufficient documentation

## 2013-09-08 DIAGNOSIS — R5381 Other malaise: Secondary | ICD-10-CM | POA: Insufficient documentation

## 2013-09-08 DIAGNOSIS — R0989 Other specified symptoms and signs involving the circulatory and respiratory systems: Secondary | ICD-10-CM | POA: Insufficient documentation

## 2013-09-08 DIAGNOSIS — I4891 Unspecified atrial fibrillation: Secondary | ICD-10-CM | POA: Insufficient documentation

## 2013-09-08 DIAGNOSIS — R51 Headache: Secondary | ICD-10-CM | POA: Insufficient documentation

## 2013-09-08 DIAGNOSIS — E785 Hyperlipidemia, unspecified: Secondary | ICD-10-CM | POA: Insufficient documentation

## 2013-09-08 DIAGNOSIS — I509 Heart failure, unspecified: Secondary | ICD-10-CM | POA: Insufficient documentation

## 2013-09-08 DIAGNOSIS — Z79899 Other long term (current) drug therapy: Secondary | ICD-10-CM | POA: Insufficient documentation

## 2013-09-08 DIAGNOSIS — K529 Noninfective gastroenteritis and colitis, unspecified: Secondary | ICD-10-CM

## 2013-09-08 DIAGNOSIS — Z87891 Personal history of nicotine dependence: Secondary | ICD-10-CM | POA: Insufficient documentation

## 2013-09-08 DIAGNOSIS — Z95818 Presence of other cardiac implants and grafts: Secondary | ICD-10-CM | POA: Insufficient documentation

## 2013-09-08 DIAGNOSIS — R609 Edema, unspecified: Secondary | ICD-10-CM | POA: Insufficient documentation

## 2013-09-08 DIAGNOSIS — J449 Chronic obstructive pulmonary disease, unspecified: Secondary | ICD-10-CM | POA: Insufficient documentation

## 2013-09-08 DIAGNOSIS — B349 Viral infection, unspecified: Secondary | ICD-10-CM

## 2013-09-08 LAB — CBC WITH DIFFERENTIAL/PLATELET
Lymphocytes Relative: 3 % — ABNORMAL LOW (ref 12–46)
MCH: 31.5 pg (ref 26.0–34.0)
MCV: 93.7 fL (ref 78.0–100.0)
Monocytes Absolute: 0.2 10*3/uL (ref 0.1–1.0)
Monocytes Relative: 2 % — ABNORMAL LOW (ref 3–12)
Neutro Abs: 10.9 10*3/uL — ABNORMAL HIGH (ref 1.7–7.7)

## 2013-09-08 LAB — COMPREHENSIVE METABOLIC PANEL
AST: 29 U/L (ref 0–37)
Alkaline Phosphatase: 81 U/L (ref 39–117)
BUN: 15 mg/dL (ref 6–23)
CO2: 25 mEq/L (ref 19–32)
Chloride: 102 mEq/L (ref 96–112)
Creatinine, Ser: 1.18 mg/dL (ref 0.50–1.35)
GFR calc non Af Amer: 57 mL/min — ABNORMAL LOW (ref >90)
Total Bilirubin: 1.1 mg/dL (ref 0.3–1.2)

## 2013-09-08 LAB — PROTIME-INR: INR: 2.31 — ABNORMAL HIGH (ref 0.00–1.49)

## 2013-09-08 MED ORDER — ONDANSETRON 8 MG PO TBDP: ORAL_TABLET | ORAL | Status: DC

## 2013-09-08 MED ORDER — SODIUM CHLORIDE 0.9 % IV BOLUS (SEPSIS)
500.0000 mL | Freq: Once | INTRAVENOUS | Status: AC
  Administered 2013-09-08: 500 mL via INTRAVENOUS

## 2013-09-08 MED ORDER — ONDANSETRON HCL 4 MG/2ML IJ SOLN
4.0000 mg | Freq: Once | INTRAMUSCULAR | Status: AC
  Administered 2013-09-08: 4 mg via INTRAVENOUS
  Filled 2013-09-08: qty 2

## 2013-09-08 MED ORDER — SODIUM CHLORIDE 0.9 % IV SOLN
Freq: Once | INTRAVENOUS | Status: AC
  Administered 2013-09-08: 12:00:00 via INTRAVENOUS

## 2013-09-08 NOTE — ED Notes (Signed)
Pt. reports persistent productive cough with chest congestion / SOB for several days , fatigue and chills.

## 2013-09-08 NOTE — ED Notes (Signed)
Pt was given some O2 to drink

## 2013-09-08 NOTE — ED Notes (Signed)
Pt reports productive cough with clear/white colored sputum, chest congestion, SOB, fever, chills, body aches, fatigue, and nausea for several days. Pt reports a hx of COPD and uses a CPAP at night. Pt denies vomiting and/or diarrhea

## 2013-09-08 NOTE — ED Notes (Addendum)
Pt ambulated approx 50 feet per EDP [place] request, pt with a steady gait, NAD, 1 person stand by assist

## 2013-09-08 NOTE — ED Provider Notes (Signed)
CSN: [id number]     Arrival date & time 09/08/13  4098 History   First MD Initiated Contact with Patient 09/08/13 [phone]     Chief Complaint  Patient presents with  . Cough   (Consider location/radiation/quality/duration/timing/severity/associated sxs/prior Treatment) HPI Comments: Patient is a 79 year old male with past medical history of hypertension and diabetes. He presents today with a several day history of congestion, cough, nausea, loose stools, fatigue and chills. He denies any ill contacts. He denies any bloody stool, any chest pain, or shortness of breath.  Patient is a 79 y.o. male presenting with URI. The history is provided by the patient.  URI Presenting symptoms: congestion and cough   Severity:  Moderate Onset quality:  Gradual Duration:  3 days Timing:  Constant Progression:  Worsening Chronicity:  New Relieved by:  Nothing Worsened by:  Nothing tried Ineffective treatments:  None tried Associated symptoms: arthralgias, headaches and myalgias     Past Medical History  Diagnosis Date  . Arthritis   . CHF (congestive heart failure)   . COPD (chronic obstructive pulmonary disease)   . Diabetes mellitus without complication   . Hypertension   . Stroke   . Gout   . Alcohol abuse   . GIB (gastrointestinal bleeding)   . Complication of anesthesia     2004   stopped breathing  3 times  on the table "  . Shortness of breath   . Sleep apnea 09/08/2012    sleep study 08/20/12-ild sleep apnea with an AHI of 7.8/hr and durimg REM 27.2/hr: CPAP 10/04/12-tried nasal pillows and face mask, but couldnt tolerate  . Atrial fibrillation     chronic on coumadin; monitor 05/20/11- AFib    . DVT, lower extremity      2005  . Hyperlipidemia   . CAD (coronary artery disease)     myoview 10/19/09-diaphragmatic attenuation vs inferior scar without ischemia; echo 09/11/12-Ef 40-45%, L atrium mod to severely dilated   Past Surgical History  Procedure Laterality Date  . Total knee  arthroplasty    . Colon surgery      colon polyps  . Hip surgery    . 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 Cardiac catheterization  10/11/02    noncritical CAD, EF 45-50%  . Cardiac catheterization  08/22/99    no significant CAD, nl EF   Family History  Problem Relation Age of Onset  . Heart attack Father   . Diabetes Father   . Dementia Mother   . Cancer Sister   . Cancer Brother   . Heart disease Brother   . Cancer Brother   . Cancer Brother   . Cancer Sister    History  Substance Use Topics  . Smoking status: Former Smoker    Quit date: 09/08/1984  . Smokeless tobacco: Former Neurosurgeon  . Alcohol Use: No     Comment: HX of Alcohol abuse    Quit  in 2004    Review of Systems  HENT: Positive for congestion.   Respiratory: Positive for cough.   Musculoskeletal: Positive for arthralgias and myalgias.  Neurological: Positive for headaches.  All other systems reviewed and are negative.    Allergies  Penicillins  Home Medications   Current Outpatient Rx  Name  Route  Sig  Dispense  Refill  . allopurinol (ZYLOPRIM) 100 MG tablet   Oral   Take 100 mg by mouth daily.         Marland Kitchen  doxazosin (CARDURA) 4 MG tablet   Oral   Take 4 mg by mouth daily.         Marland Kitchen lisinopril (PRINIVIL,ZESTRIL) 10 MG tablet   Oral   Take 10 mg by mouth daily.         . metFORMIN (GLUCOPHAGE) 1000 MG tablet   Oral   Take 1,000 mg by mouth daily with breakfast.         . metoprolol tartrate (LOPRESSOR) 50 MG tablet   Oral   Take 1 tablet (50 mg total) by mouth daily.   60 tablet   0   . MYRBETRIQ 50 MG TB24   Oral   Take 50 mg by mouth every other day.         . pravastatin (PRAVACHOL) 80 MG tablet   Oral   Take 80 mg by mouth daily.         Marland Kitchen warfarin (COUMADIN) 5 MG tablet   Oral   Take 2.5-5 mg by mouth daily. Takes 1 tablet daily except Sunday, then takes 1/2 tablet          BP 121/53  Pulse 81   Temp(Src) 98.1 F (36.7 C) (Oral)  Resp 20  SpO2 92% Physical Exam  Nursing note and vitals reviewed. Constitutional: He is oriented to person, place, and time. He appears well-developed and well-nourished. No distress.  HENT:  Head: Normocephalic and atraumatic.  Mouth/Throat: Oropharynx is clear and moist.  Neck: Normal range of motion. Neck supple.  Cardiovascular: Normal rate, regular rhythm and normal heart sounds.   No murmur heard. Pulmonary/Chest: Effort normal and breath sounds normal. No respiratory distress. He has no wheezes.  Abdominal: Soft. Bowel sounds are normal. He exhibits no distension. There is no tenderness.  Musculoskeletal: Normal range of motion. He exhibits no edema.  There is trace bilateral lower extremity edema.  Lymphadenopathy:    He has no cervical adenopathy.  Neurological: He is alert and oriented to person, place, and time. No cranial nerve deficit.  Skin: Skin is warm and dry. He is not diaphoretic.    ED Course  Procedures (including critical care time) Labs Review Labs Reviewed  TROPONIN I  CBC WITH DIFFERENTIAL  COMPREHENSIVE METABOLIC PANEL   Imaging Review Dg Chest 2 View  09/08/2013   CLINICAL DATA:  Shortness of breath, cough, nausea.  EXAM: CHEST  2 VIEW  COMPARISON:  None.  FINDINGS: The heart size and mediastinal contours are within normal limits. Both lungs are clear. The visualized skeletal structures are unremarkable.  IMPRESSION: No active cardiopulmonary disease.   Electronically Signed   By: Burman Nieves M.D.   On: 09/08/2013 06:40    EKG Interpretation    Date/Time:  Wednesday September 08 2013 07:50:33 EST Ventricular Rate:  106 PR Interval:    QRS Duration: 79 QT Interval:  328 QTC Calculation: 435 R Axis:   59 Text Interpretation:  Atrial fibrillation Ventricular premature complex Borderline repolarization abnormality Baseline wander in lead(s) II III aVR aVF Confirmed by DELOS  MD, Marcel Gary ([id number]) on 09/08/2013  8:02:30 AM            MDM  No diagnosis found. Patient is a 79 year old male who presents with chest congestion cough, fatigue, nausea, and vomiting for the past several days. Workup reveals an elevation of white count of 11.5 but remainder of laboratory studies are otherwise unremarkable. His chest x-ray is negative for pneumonia. This sounds like a viral process. The patient was given boluses of  normal saline. He was hypotensive while lying flat but when standing and ambulating his blood pressures normalized. He is feeling improved with the medications given and I feel as though he is stable for discharge. He understands to return if his symptoms worsen or change. He will be given Zofran as needed for his nausea.    Geoffery Lyons, MD 09/08/13 [phone]

## 2013-09-08 NOTE — ED Notes (Signed)
Pt discharged home with all belongings, pt alert and ambulatory upon discharge, pt prescribed 1 new RX, driven home by spouse, pt verbalizes understanding of discharge instructions, pt transported to the exit via wheel chair by [employer] NT

## 2013-09-10 ENCOUNTER — Emergency Department (HOSPITAL_COMMUNITY)
Admission: EM | Admit: 2013-09-10 | Discharge: 2013-09-11 | Disposition: A | Discharge status: Home / Self Care | Payer: Medicare Other | Attending: Emergency Medicine | Admitting: Emergency Medicine

## 2013-09-10 ENCOUNTER — Emergency Department (HOSPITAL_COMMUNITY): Payer: Medicare Other

## 2013-09-10 ENCOUNTER — Encounter (HOSPITAL_COMMUNITY): Payer: Self-pay | Admitting: Emergency Medicine

## 2013-09-10 DIAGNOSIS — E785 Hyperlipidemia, unspecified: Secondary | ICD-10-CM | POA: Insufficient documentation

## 2013-09-10 DIAGNOSIS — I10 Essential (primary) hypertension: Secondary | ICD-10-CM | POA: Insufficient documentation

## 2013-09-10 DIAGNOSIS — I2510 Atherosclerotic heart disease of native coronary artery without angina pectoris: Secondary | ICD-10-CM | POA: Insufficient documentation

## 2013-09-10 DIAGNOSIS — R0600 Dyspnea, unspecified: Secondary | ICD-10-CM

## 2013-09-10 DIAGNOSIS — M109 Gout, unspecified: Secondary | ICD-10-CM | POA: Insufficient documentation

## 2013-09-10 DIAGNOSIS — Z95818 Presence of other cardiac implants and grafts: Secondary | ICD-10-CM | POA: Insufficient documentation

## 2013-09-10 DIAGNOSIS — I509 Heart failure, unspecified: Secondary | ICD-10-CM | POA: Insufficient documentation

## 2013-09-10 DIAGNOSIS — Z7901 Long term (current) use of anticoagulants: Secondary | ICD-10-CM | POA: Insufficient documentation

## 2013-09-10 DIAGNOSIS — Z8719 Personal history of other diseases of the digestive system: Secondary | ICD-10-CM | POA: Insufficient documentation

## 2013-09-10 DIAGNOSIS — J441 Chronic obstructive pulmonary disease with (acute) exacerbation: Secondary | ICD-10-CM | POA: Insufficient documentation

## 2013-09-10 DIAGNOSIS — E119 Type 2 diabetes mellitus without complications: Secondary | ICD-10-CM | POA: Insufficient documentation

## 2013-09-10 DIAGNOSIS — Z880 Allergy status to penicillin: Secondary | ICD-10-CM | POA: Insufficient documentation

## 2013-09-10 DIAGNOSIS — Z79899 Other long term (current) drug therapy: Secondary | ICD-10-CM | POA: Insufficient documentation

## 2013-09-10 DIAGNOSIS — I4891 Unspecified atrial fibrillation: Secondary | ICD-10-CM | POA: Insufficient documentation

## 2013-09-10 DIAGNOSIS — Z87891 Personal history of nicotine dependence: Secondary | ICD-10-CM | POA: Insufficient documentation

## 2013-09-10 DIAGNOSIS — Z8673 Personal history of transient ischemic attack (TIA), and cerebral infarction without residual deficits: Secondary | ICD-10-CM | POA: Insufficient documentation

## 2013-09-10 DIAGNOSIS — Z86718 Personal history of other venous thrombosis and embolism: Secondary | ICD-10-CM | POA: Insufficient documentation

## 2013-09-10 LAB — COMPREHENSIVE METABOLIC PANEL
ALBUMIN: 3.2 g/dL — AB (ref 3.5–5.2)
ALK PHOS: 110 U/L (ref 39–117)
ALT: 28 U/L (ref 0–53)
AST: 38 U/L — ABNORMAL HIGH (ref 0–37)
BILIRUBIN TOTAL: 0.5 mg/dL (ref 0.3–1.2)
BUN: 19 mg/dL (ref 6–23)
CHLORIDE: 108 meq/L (ref 96–112)
CO2: 26 mEq/L (ref 19–32)
Calcium: 8.9 mg/dL (ref 8.4–10.5)
Creatinine, Ser: 0.8 mg/dL (ref 0.50–1.35)
GFR calc Af Amer: >90 mL/min (ref >90)
GFR calc non Af Amer: 83 mL/min — ABNORMAL LOW (ref >90)
GLUCOSE: 138 mg/dL — AB (ref 70–99)
POTASSIUM: 4.1 meq/L (ref 3.7–5.3)
SODIUM: 146 meq/L (ref 137–147)
TOTAL PROTEIN: 6.4 g/dL (ref 6.0–8.3)

## 2013-09-10 LAB — CBC WITH DIFFERENTIAL/PLATELET
BASOS PCT: 0 % (ref 0–1)
Basophils Absolute: 0 10*3/uL (ref 0.0–0.1)
EOS ABS: 0.2 10*3/uL (ref 0.0–0.7)
Eosinophils Relative: 2 % (ref 0–5)
HCT: 37.7 % — ABNORMAL LOW (ref 39.0–52.0)
HEMOGLOBIN: 12.7 g/dL — AB (ref 13.0–17.0)
Lymphocytes Relative: 10 % — ABNORMAL LOW (ref 12–46)
Lymphs Abs: 0.6 10*3/uL — ABNORMAL LOW (ref 0.7–4.0)
MCH: 32.1 pg (ref 26.0–34.0)
MCHC: 33.7 g/dL (ref 30.0–36.0)
MCV: 95.2 fL (ref 78.0–100.0)
Monocytes Absolute: 0.5 10*3/uL (ref 0.1–1.0)
Monocytes Relative: 7 % (ref 3–12)
NEUTROS PCT: 81 % — AB (ref 43–77)
Neutro Abs: 5.1 10*3/uL (ref 1.7–7.7)
Platelets: 94 10*3/uL — ABNORMAL LOW (ref 150–400)
RBC: 3.96 MIL/uL — ABNORMAL LOW (ref 4.22–5.81)
RDW: 13.7 % (ref 11.5–15.5)
WBC: 6.4 10*3/uL (ref 4.0–10.5)

## 2013-09-10 LAB — TROPONIN I: Troponin I: <0.3 ng/mL (ref <0.30)

## 2013-09-10 LAB — PROTIME-INR
INR: 2.53 — ABNORMAL HIGH (ref 0.00–1.49)
Prothrombin Time: 26.4 seconds — ABNORMAL HIGH (ref 11.6–15.2)

## 2013-09-10 LAB — PRO B NATRIURETIC PEPTIDE: Pro B Natriuretic peptide (BNP): 1946 pg/mL — ABNORMAL HIGH (ref 0–450)

## 2013-09-10 MED ORDER — ALBUTEROL SULFATE (2.5 MG/3ML) 0.083% IN NEBU
5.0000 mg | INHALATION_SOLUTION | Freq: Once | RESPIRATORY_TRACT | Status: AC
  Administered 2013-09-10: 5 mg via RESPIRATORY_TRACT
  Filled 2013-09-10: qty 6

## 2013-09-10 MED ORDER — PREDNISONE 20 MG PO TABS
60.0000 mg | ORAL_TABLET | ORAL | Status: AC
  Administered 2013-09-10: 60 mg via ORAL
  Filled 2013-09-10: qty 3

## 2013-09-10 MED ORDER — FUROSEMIDE 20 MG PO TABS: 20.0000 mg | ORAL_TABLET | Freq: Every day | ORAL | Status: AC

## 2013-09-10 MED ORDER — PREDNISONE 20 MG PO TABS: 60.0000 mg | ORAL_TABLET | Freq: Every day | ORAL | Status: AC

## 2013-09-10 MED ORDER — ALBUTEROL SULFATE (2.5 MG/3ML) 0.083% IN NEBU
INHALATION_SOLUTION | RESPIRATORY_TRACT | Status: DC
Start: 2013-09-10 — End: 2013-09-11
  Filled 2013-09-10: qty 3

## 2013-09-10 MED ORDER — AZITHROMYCIN 250 MG PO TABS
500.0000 mg | ORAL_TABLET | Freq: Once | ORAL | Status: AC
  Administered 2013-09-11: 500 mg via ORAL
  Filled 2013-09-10: qty 2

## 2013-09-10 MED ORDER — AZITHROMYCIN 250 MG PO TABS
250.0000 mg | ORAL_TABLET | Freq: Every day | ORAL | Status: AC
Start: 2013-09-11 — End: 2013-09-14

## 2013-09-10 NOTE — ED Notes (Signed)
The pt has had sob for 3-4 days.  He wa seen here 2 days ago for the same.  Today his sob became worse.  He has a rattling sound in his chest that does not clear with coughing.  No temp as far as he knows

## 2013-09-10 NOTE — Discharge Instructions (Signed)
It is important that you follow up with your primary care physician on Monday to ensure appropriate ongoing care.  Your presentation tonight is largely due to a combination of both your lung and heart issues.  Please use the medication as directed, and to discuss this short course of medication with your physician on Monday.  Return here for concerning changes in your condition.

## 2013-09-10 NOTE — ED Provider Notes (Addendum)
CSN: [id number]     Arrival date & time 09/10/13  1810 History   First MD Initiated Contact with Patient 09/10/13 1934     Chief Complaint  Patient presents with  . Shortness of Breath    HPI  Patient presents with dyspnea, cough, congestion.  No new fever, the patient previously had chills. Patient was seen here several days ago for similar complaints, though he notes that the dyspnea is essentially no. He denies chest pain, states that he is generally well, active. Patient does endorse a history of CHF, and COPD. He is on oxygen at night, and has not used steroids recently.  No confusion, no disorientation, no syncope, no belly pain, no vomiting, no diarrhea.   Past Medical History  Diagnosis Date  . Arthritis   . CHF (congestive heart failure)   . COPD (chronic obstructive pulmonary disease)   . Diabetes mellitus without complication   . Hypertension   . Stroke   . Gout   . Alcohol abuse   . GIB (gastrointestinal bleeding)   . Complication of anesthesia     2004   stopped breathing  3 times  on the table "  . Shortness of breath   . Sleep apnea 09/08/2012    sleep study 08/20/12-ild sleep apnea with an AHI of 7.8/hr and durimg REM 27.2/hr: CPAP 10/04/12-tried nasal pillows and face mask, but couldnt tolerate  . Atrial fibrillation     chronic on coumadin; monitor 05/20/11- AFib    . DVT, lower extremity      2005  . Hyperlipidemia   . CAD (coronary artery disease)     myoview 10/19/09-diaphragmatic attenuation vs inferior scar without ischemia; echo 09/11/12-Ef 40-45%, L atrium mod to severely dilated   Past Surgical History  Procedure Laterality Date  . Total knee arthroplasty    . Colon surgery      colon polyps  . Hip surgery    . 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 Cardiac catheterization  10/11/02    noncritical CAD, EF 45-50%  . Cardiac catheterization  08/22/99    no significant CAD, nl EF   Family  History  Problem Relation Age of Onset  . Heart attack Father   . Diabetes Father   . Dementia Mother   . Cancer Sister   . Cancer Brother   . Heart disease Brother   . Cancer Brother   . Cancer Brother   . Cancer Sister    History  Substance Use Topics  . Smoking status: Former Smoker    Quit date: 09/08/1984  . Smokeless tobacco: Former Systems developer  . Alcohol Use: No     Comment: HX of Alcohol abuse    Quit  in 2004    Review of Systems  Constitutional:       Per HPI, otherwise negative  HENT:       Per HPI, otherwise negative  Respiratory:       Per HPI, otherwise negative  Cardiovascular:       Per HPI, otherwise negative  Gastrointestinal: Negative for vomiting.  Endocrine:       Negative aside from HPI  Genitourinary:       Neg aside from HPI   Musculoskeletal:       Per HPI, otherwise negative  Skin: Negative.   Neurological: Negative for syncope.    Allergies  Penicillins  Home Medications   Current Outpatient  Rx  Name  Route  Sig  Dispense  Refill  . allopurinol (ZYLOPRIM) 100 MG tablet   Oral   Take 100 mg by mouth daily.         Marland Kitchen doxazosin (CARDURA) 4 MG tablet   Oral   Take 4 mg by mouth daily.         Marland Kitchen guaiFENesin-dextromethorphan (ROBITUSSIN DM) 100-10 MG/5ML syrup   Oral   Take 30 mLs by mouth every 8 (eight) hours as needed for cough.         Marland Kitchen lisinopril (PRINIVIL,ZESTRIL) 10 MG tablet   Oral   Take 10 mg by mouth daily.         . metFORMIN (GLUCOPHAGE) 1000 MG tablet   Oral   Take 1,000 mg by mouth daily with breakfast.         . metoprolol tartrate (LOPRESSOR) 50 MG tablet   Oral   Take 1 tablet (50 mg total) by mouth daily.   60 tablet   0   . Multiple Vitamin (MULTIVITAMIN WITH MINERALS) TABS tablet   Oral   Take 1 tablet by mouth daily.         Marland Kitchen MYRBETRIQ 50 MG TB24   Oral   Take 50 mg by mouth every other day.         . naproxen sodium (ANAPROX) 220 MG tablet   Oral   Take 220 mg by mouth daily as  needed (for pain).         . pravastatin (PRAVACHOL) 80 MG tablet   Oral   Take 80 mg by mouth daily.         Marland Kitchen warfarin (COUMADIN) 5 MG tablet   Oral   Take 2.5-5 mg by mouth daily. Takes 1 tablet daily except Sunday, then takes 1/2 tablet          BP 122/70  Pulse 92  Temp(Src) 98.1 F (36.7 C) (Oral)  Resp 18  SpO2 98% Physical Exam  Nursing note and vitals reviewed. Constitutional: He is oriented to person, place, and time. He appears well-developed. No distress.  HENT:  Head: Normocephalic and atraumatic.  Eyes: Conjunctivae and EOM are normal.  Cardiovascular: Normal rate and regular rhythm.   Pulmonary/Chest: Effort normal. No stridor. No respiratory distress. He has decreased breath sounds. He has wheezes.  Abdominal: He exhibits no distension.  Musculoskeletal: He exhibits no edema.  Neurological: He is alert and oriented to person, place, and time.  Skin: Skin is warm and dry.  Psychiatric: He has a normal mood and affect.    ED Course  Procedures (including critical care time) Labs Review Labs Reviewed  COMPREHENSIVE METABOLIC PANEL - Abnormal; Notable for the following:    Glucose, Bld 138 (*)    Albumin 3.2 (*)    AST 38 (*)    GFR calc non Af Amer 83 (*)    All other components within normal limits  PRO B NATRIURETIC PEPTIDE - Abnormal; Notable for the following:    Pro B Natriuretic peptide (BNP) 1946.0 (*)    All other components within normal limits  CBC WITH DIFFERENTIAL - Abnormal; Notable for the following:    RBC 3.96 (*)    Hemoglobin 12.7 (*)    HCT 37.7 (*)    Platelets 94 (*)    Neutrophils Relative % 81 (*)    Lymphocytes Relative 10 (*)    Lymphs Abs 0.6 (*)    All other components within normal limits  TROPONIN I  PROTIME-INR   Imaging Review Dg Chest 2 View  09/10/2013   CLINICAL DATA:  Chest pain and cough.  EXAM: CHEST  2 VIEW  COMPARISON:  09/08/2013.  FINDINGS: The cardiac silhouette, mediastinal and hilar contours are  stable. Mild tortuosity and ectasia of the thoracic aorta. There are bronchitic type changes suggesting bronchitis but no definite infiltrates or effusions. The bony thorax is intact.  IMPRESSION: Chronic lung changes with possible superimposed bronchitis. No definite infiltrates or effusions.   Electronically Signed   By: Kalman Jewels M.D.   On: 09/10/2013 20:28    EKG Interpretation    Date/Time:  Friday September 10 2013 18:48:37 EST Ventricular Rate:  94 PR Interval:    QRS Duration: 86 QT Interval:  362 QTC Calculation: 452 R Axis:   50 Text Interpretation:  Atrial fibrillation with premature ventricular or aberrantly conducted complexes Cannot rule out Anteroseptal infarct , age undetermined Abnormal ECG Atrial fibrillation versus aflutter Artifact Abnormal ekg Confirmed by Carmin Muskrat  MD [phone]) on 09/10/2013 7:38:28 PM           11:49 PM On exam patient appears comfortable.  I discussed all findings with him and his wife.  His respiratory effort seems improved. MDM  No diagnosis found. Patient presents with concerns of ongoing URI like symptoms, dyspnea.  The patient is notable history of CHF, COPD.  Patient's presentation is likely multifactorial.  Absent fever, and with appropriate anticoagulation, no evidence of ongoing coronary ischemia, patient was provided for discharge after he had a subjective improvement here.  Patient started on course of steroids, antibiotics, a week of Lasix dosing.    Carmin Muskrat, MD 09/10/13 [place], MD 09/11/13 [phone]

## 2013-09-13 ENCOUNTER — Telehealth: Payer: Self-pay | Admitting: Pharmacist Clinician (PhC)/ Clinical Pharmacy Specialist

## 2013-09-13 NOTE — Telephone Encounter (Signed)
Patient states that he needs to speak with you about his coumadin

## 2013-09-15 ENCOUNTER — Telehealth: Payer: Self-pay | Admitting: *Deleted

## 2013-09-15 NOTE — Telephone Encounter (Signed)
ER visits x 3 for pneumonia in last few weeks, INR drawn on 1/2 was 2.5.  Advised pt to schedule appt for early February, needs appt with Dr. Gwenlyn Found same time.

## 2013-09-15 NOTE — Telephone Encounter (Signed)
Pt called in regards to his coumadin appointment. He would like to talk to you about this. He had to cx his appointment due to being in the hospital for pneumonia.

## 2013-09-29 ENCOUNTER — Other Ambulatory Visit (HOSPITAL_COMMUNITY): Payer: Self-pay | Admitting: Cardiovascular Disease

## 2013-09-29 DIAGNOSIS — I739 Peripheral vascular disease, unspecified: Secondary | ICD-10-CM

## 2013-09-30 ENCOUNTER — Ambulatory Visit (HOSPITAL_COMMUNITY)
Admission: RE | Admit: 2013-09-30 | Discharge: 2013-09-30 | Disposition: A | Discharge status: Home / Self Care | Payer: Medicare Other | Source: Ambulatory Visit | Attending: Internal Medicine | Admitting: Internal Medicine

## 2013-09-30 DIAGNOSIS — I70209 Unspecified atherosclerosis of native arteries of extremities, unspecified extremity: Secondary | ICD-10-CM | POA: Insufficient documentation

## 2013-09-30 DIAGNOSIS — I739 Peripheral vascular disease, unspecified: Secondary | ICD-10-CM

## 2013-09-30 NOTE — Progress Notes (Signed)
Lower Extremity Arterial Duplex Completed. °Brianna L Mazza,RVT °

## 2013-10-13 ENCOUNTER — Encounter: Payer: Self-pay | Admitting: *Deleted

## 2013-10-13 ENCOUNTER — Telehealth: Payer: Self-pay | Admitting: *Deleted

## 2013-10-13 DIAGNOSIS — I739 Peripheral vascular disease, unspecified: Secondary | ICD-10-CM

## 2013-10-13 NOTE — Telephone Encounter (Signed)
Message copied by Chauncy Lean on Wed Oct 13, 2013 11:09 AM ------      Message from: Lorretta Harp      Created: Mon Oct 11, 2013  8:38 PM       No change from prior study. Repeat in 12 months. ------

## 2013-10-13 NOTE — Telephone Encounter (Signed)
Order placed for repeat lower extremity dopplers in 1 year

## 2013-10-15 ENCOUNTER — Encounter: Payer: Self-pay | Admitting: Cardiovascular Disease

## 2013-10-15 ENCOUNTER — Ambulatory Visit (INDEPENDENT_AMBULATORY_CARE_PROVIDER_SITE_OTHER): Payer: Medicare Other | Admitting: Pharmacist Clinician (PhC)/ Clinical Pharmacy Specialist

## 2013-10-15 ENCOUNTER — Ambulatory Visit (INDEPENDENT_AMBULATORY_CARE_PROVIDER_SITE_OTHER): Payer: Medicare Other | Admitting: Cardiovascular Disease

## 2013-10-15 VITALS — BP 122/54 | HR 84 | Ht 72.0 in | Wt 264.1 lb

## 2013-10-15 DIAGNOSIS — I2510 Atherosclerotic heart disease of native coronary artery without angina pectoris: Secondary | ICD-10-CM

## 2013-10-15 DIAGNOSIS — R0602 Shortness of breath: Secondary | ICD-10-CM

## 2013-10-15 DIAGNOSIS — I10 Essential (primary) hypertension: Secondary | ICD-10-CM | POA: Insufficient documentation

## 2013-10-15 DIAGNOSIS — E785 Hyperlipidemia, unspecified: Secondary | ICD-10-CM | POA: Insufficient documentation

## 2013-10-15 DIAGNOSIS — I739 Peripheral vascular disease, unspecified: Secondary | ICD-10-CM

## 2013-10-15 DIAGNOSIS — I4891 Unspecified atrial fibrillation: Secondary | ICD-10-CM

## 2013-10-15 DIAGNOSIS — Z7901 Long term (current) use of anticoagulants: Secondary | ICD-10-CM

## 2013-10-15 DIAGNOSIS — G4733 Obstructive sleep apnea (adult) (pediatric): Secondary | ICD-10-CM | POA: Insufficient documentation

## 2013-10-15 DIAGNOSIS — E782 Mixed hyperlipidemia: Secondary | ICD-10-CM

## 2013-10-15 LAB — POCT INR: INR: 2.3

## 2013-10-15 NOTE — Patient Instructions (Signed)
Your physician recommends that you schedule a follow-up appointment in: 6 months with a PA/NP.  Your physician recommends that you schedule a follow-up appointment in: 12 months with Dr. Gwenlyn Found.

## 2013-10-15 NOTE — Assessment & Plan Note (Signed)
Rate controlled on Coumadin anticoagulation

## 2013-10-15 NOTE — Assessment & Plan Note (Signed)
This is Raymond Chaney chief complaint. Chronic shortness of breath which has a complaint in every office visit. I believe is multifactorial related to obesity hypoventilation, obstructive sleep apnea, chronic atrial fibrillation and moderate LV dysfunction.

## 2013-10-15 NOTE — Assessment & Plan Note (Signed)
Status post cardiac catheterization by Dr. Alla German August 2008 revealing an anomalous circumflex, normal LV function with 90% stenosis in the circumflex and LAD. Medical therapy was recommended. He denies chest pain. A recent Myoview stress test was nonischemic.

## 2013-10-15 NOTE — Progress Notes (Signed)
10/15/2013 Raymond Chaney   07-May-1932  [id number]  Primary Physician Tivis Ringer, MD Primary Cardiologist: Lorretta Harp MD Renae Gloss   HPI:  Raymond Chaney is a 80 year old mildly overweight married Caucasian male, father of 62 and grandfather of 6 grandchildren, last seen by Dr. Gwenlyn Found in the office January of this year. He has a history of moderate CAD by catheterization performed by Dr. 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 His other problems include COPD with remote tobacco abuse, having quit 20 years ago, with dyspnea on exertion. He has hypertension, hyperlipidemia, insulin-dependent diabetes, and chronic atrial fibrillation, on Coumadin anticoagulation. He does have diabetic peripheral neuropathy by symptoms. His last Myoview, performed October 19, 2009, showed diaphragmatic attenuation versus inferior scar without ischemia. He has an excellent lipid profile performed by Dr. Dagmar Hait for secondary prevention. He is followed with obstructive sleep apnea by Dr. Claiborne Billings. Unfortunately the patient is unable to wear the CPAP.  He was seen by me approximately 6 months ago. He had a Myoview stress test performed 02/16/13 which was low risk. His breathing is somewhat improved with wearing nocturnal oxygen. He denies chest pain. 2-D echocardiography performed 09/10/12 revealed an ejection fraction of 40-45%.   Current Outpatient Prescriptions  Medication Sig Dispense Refill  . allopurinol (ZYLOPRIM) 100 MG tablet Take 100 mg by mouth daily.      Marland Kitchen doxazosin (CARDURA) 4 MG tablet Take 4 mg by mouth daily.      Marland Kitchen guaiFENesin-dextromethorphan (ROBITUSSIN DM) 100-10 MG/5ML syrup Take 30 mLs by mouth every 8 (eight) hours as needed for cough.      Marland Kitchen lisinopril (PRINIVIL,ZESTRIL) 10 MG tablet Take 10 mg by mouth daily.      . metFORMIN (GLUCOPHAGE) 1000 MG tablet Take 1,000 mg by mouth daily with breakfast.      . metoprolol tartrate  (LOPRESSOR) 50 MG tablet Take 1 tablet (50 mg total) by mouth daily.  60 tablet  0  . Multiple Vitamin (MULTIVITAMIN WITH MINERALS) TABS tablet Take 1 tablet by mouth daily.      Marland Kitchen MYRBETRIQ 50 MG TB24 Take 50 mg by mouth every other day.      . naproxen sodium (ANAPROX) 220 MG tablet Take 220 mg by mouth daily as needed (for pain).      . pravastatin (PRAVACHOL) 80 MG tablet Take 80 mg by mouth daily.      Marland Kitchen warfarin (COUMADIN) 5 MG tablet Take 2.5-5 mg by mouth daily. Takes 1 tablet daily except Sunday, then takes 1/2 tablet       No current facility-administered medications for this visit.    Allergies  Allergen Reactions  . Penicillins Rash    History   Social History  . Marital Status: Married    Spouse Name: N/A    Number of Children: N/A  . Years of Education: N/A   Occupational History  . Not on file.   Social History Main Topics  . Smoking status: Former Smoker    Quit date: 09/08/1984  . Smokeless tobacco: Former Systems developer  . Alcohol Use: No     Comment: HX of Alcohol abuse    Quit  in 2004  . Drug Use: No  . Sexual Activity: Not on file   Other Topics Concern  . Not on file   Social History Narrative  . No narrative on file     Review of Systems: General: negative for chills, fever, night sweats or  weight changes.  Cardiovascular: negative for chest pain, dyspnea on exertion, edema, orthopnea, palpitations, paroxysmal nocturnal dyspnea or shortness of breath Dermatological: negative for rash Respiratory: negative for cough or wheezing Urologic: negative for hematuria Abdominal: negative for nausea, vomiting, diarrhea, bright red blood per rectum, melena, or hematemesis Neurologic: negative for visual changes, syncope, or dizziness All other systems reviewed and are otherwise negative except as noted above.    Blood pressure 122/54, pulse 84, height 6' (1.829 m), weight 264 lb 1.6 oz (119.795 kg).  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 Alla German August 2008 revealing an anomalous circumflex, normal LV function with 90% stenosis in the circumflex and LAD. Medical therapy was recommended. He denies chest pain. A recent Myoview stress test was nonischemic.  Atrial fibrillation Rate controlled on Coumadin anticoagulation  Essential hypertension Controlled on current medications  Hyperlipidemia On statin therapy followed by his PCP  SOB (shortness of breath), more uncomfortable  This is Raymond Chaney chief complaint. Chronic shortness of breath which has a complaint in every office visit. I believe is multifactorial related to obesity hypoventilation, obstructive sleep apnea, chronic atrial fibrillation and moderate LV dysfunction.      Lorretta Harp MD FACP,FACC,FAHA, [facility] 10/15/2013 7:58 AM

## 2013-10-15 NOTE — Assessment & Plan Note (Signed)
Controlled on current medications 

## 2013-10-15 NOTE — Assessment & Plan Note (Signed)
On statin therapy followed by his PCP 

## 2013-10-25 ENCOUNTER — Other Ambulatory Visit: Payer: Self-pay | Admitting: Internal Medicine

## 2013-10-25 ENCOUNTER — Ambulatory Visit
Admission: RE | Admit: 2013-10-25 | Discharge: 2013-10-25 | Disposition: A | Discharge status: Home / Self Care | Payer: Medicare Other | Source: Ambulatory Visit | Attending: Internal Medicine | Admitting: Internal Medicine

## 2013-10-25 DIAGNOSIS — N50819 Testicular pain, unspecified: Secondary | ICD-10-CM

## 2013-11-09 ENCOUNTER — Telehealth: Payer: Self-pay | Admitting: Pharmacist Clinician (PhC)/ Clinical Pharmacy Specialist

## 2013-11-09 MED ORDER — WARFARIN SODIUM 5 MG PO TABS: ORAL_TABLET | ORAL | Status: DC

## 2013-11-09 NOTE — Telephone Encounter (Signed)
Pt states out of warfarin - call refill to pharmacy

## 2013-11-26 ENCOUNTER — Ambulatory Visit: Payer: Medicare Other | Admitting: Pharmacist Clinician (PhC)/ Clinical Pharmacy Specialist

## 2013-12-01 ENCOUNTER — Ambulatory Visit (INDEPENDENT_AMBULATORY_CARE_PROVIDER_SITE_OTHER): Payer: Medicare Other | Admitting: Pharmacist Clinician (PhC)/ Clinical Pharmacy Specialist

## 2013-12-01 DIAGNOSIS — Z7901 Long term (current) use of anticoagulants: Secondary | ICD-10-CM

## 2013-12-01 DIAGNOSIS — I4891 Unspecified atrial fibrillation: Secondary | ICD-10-CM

## 2013-12-01 LAB — POCT INR: INR: 2.4

## 2014-01-12 ENCOUNTER — Ambulatory Visit (INDEPENDENT_AMBULATORY_CARE_PROVIDER_SITE_OTHER): Payer: Medicare Other | Admitting: Pharmacist Clinician (PhC)/ Clinical Pharmacy Specialist

## 2014-01-12 DIAGNOSIS — Z7901 Long term (current) use of anticoagulants: Secondary | ICD-10-CM

## 2014-01-12 DIAGNOSIS — I4891 Unspecified atrial fibrillation: Secondary | ICD-10-CM

## 2014-01-12 LAB — POCT INR: INR: 1.9

## 2014-02-23 ENCOUNTER — Ambulatory Visit (INDEPENDENT_AMBULATORY_CARE_PROVIDER_SITE_OTHER): Payer: Medicare Other | Admitting: Pharmacist Clinician (PhC)/ Clinical Pharmacy Specialist

## 2014-02-23 DIAGNOSIS — Z7901 Long term (current) use of anticoagulants: Secondary | ICD-10-CM

## 2014-02-23 DIAGNOSIS — I4891 Unspecified atrial fibrillation: Secondary | ICD-10-CM

## 2014-02-23 LAB — POCT INR: INR: 2

## 2014-03-29 ENCOUNTER — Ambulatory Visit: Payer: Medicare Other | Admitting: Cardiovascular Disease

## 2014-03-29 ENCOUNTER — Ambulatory Visit: Payer: Medicare Other | Admitting: Pharmacist Clinician (PhC)/ Clinical Pharmacy Specialist

## 2014-04-06 ENCOUNTER — Encounter: Payer: Self-pay | Admitting: Cardiovascular Disease

## 2014-04-06 ENCOUNTER — Ambulatory Visit (INDEPENDENT_AMBULATORY_CARE_PROVIDER_SITE_OTHER): Payer: Medicare Other | Admitting: Pharmacist Clinician (PhC)/ Clinical Pharmacy Specialist

## 2014-04-06 ENCOUNTER — Ambulatory Visit (INDEPENDENT_AMBULATORY_CARE_PROVIDER_SITE_OTHER): Payer: Medicare Other | Admitting: Cardiovascular Disease

## 2014-04-06 VITALS — BP 112/56 | HR 70 | Ht 69.0 in | Wt 257.6 lb

## 2014-04-06 DIAGNOSIS — Z7901 Long term (current) use of anticoagulants: Secondary | ICD-10-CM

## 2014-04-06 DIAGNOSIS — I4891 Unspecified atrial fibrillation: Secondary | ICD-10-CM

## 2014-04-06 DIAGNOSIS — I482 Chronic atrial fibrillation, unspecified: Secondary | ICD-10-CM

## 2014-04-06 DIAGNOSIS — E782 Mixed hyperlipidemia: Secondary | ICD-10-CM

## 2014-04-06 DIAGNOSIS — I10 Essential (primary) hypertension: Secondary | ICD-10-CM

## 2014-04-06 LAB — POCT INR: INR: 1.8

## 2014-04-06 NOTE — Progress Notes (Signed)
04/06/2014 Raymond Chaney   [birth year]  [id number]  Primary Physician Raymond Ringer, MD Primary Cardiologist: Raymond Harp MD Raymond Chaney   HPI:  Raymond Chaney is a 80 year old mildly overweight married Caucasian male, father of 71 and grandfather of 6 grandchildren, who I last saw in the office 10/15/13.Marland Kitchen He has a history of moderate CAD by catheterization performed by Raymond Chaney August 2008 with an anomalous circumflex and normal LV function. His other problems include COPD with remote tobacco abuse, having quit 20 years ago, with dyspnea on exertion. He has hypertension, hyperlipidemia, insulin-dependent diabetes, and chronic atrial fibrillation, on Coumadin anticoagulation. He does have diabetic peripheral neuropathy by symptoms. His last Myoview, performed October 19, 2009, showed diaphragmatic attenuation versus inferior scar without ischemia. He has an excellent lipid profile performed by Dr. Dagmar Chaney for secondary prevention. He is followed with obstructive sleep apnea by Dr. Claiborne Chaney. Unfortunately the patient is unable to wear the CPAP.  He was seen by me approximately 6 months ago. He had a Myoview stress test performed 02/16/13 which was low risk. His breathing is somewhat improved with wearing nocturnal oxygen. He denies chest pain. 2-D echocardiography performed 09/10/12 revealed an ejection fraction of 40-45%.    Current Outpatient Prescriptions  Medication Sig Dispense Refill  . allopurinol (ZYLOPRIM) 100 MG tablet Take 100 mg by mouth daily.      Marland Kitchen doxazosin (CARDURA) 4 MG tablet Take 4 mg by mouth daily.      . fluticasone (FLONASE) 50 MCG/ACT nasal spray Use as directed      . guaiFENesin-dextromethorphan (ROBITUSSIN DM) 100-10 MG/5ML syrup Take 30 mLs by mouth every 8 (eight) hours as needed for cough.      Marland Kitchen lisinopril (PRINIVIL,ZESTRIL) 10 MG tablet Take 10 mg by mouth daily.      . metFORMIN (GLUCOPHAGE) 1000 MG tablet Take 1,000 mg by mouth daily  with breakfast.      . metoprolol tartrate (LOPRESSOR) 50 MG tablet Take 1 tablet (50 mg total) by mouth daily.  60 tablet  0  . Multiple Vitamin (MULTIVITAMIN WITH MINERALS) TABS tablet Take 1 tablet by mouth daily.      Marland Kitchen MYRBETRIQ 50 MG TB24 Take 50 mg by mouth every other day.      . naproxen sodium (ANAPROX) 220 MG tablet Take 220 mg by mouth daily as needed (for pain).      . pravastatin (PRAVACHOL) 80 MG tablet Take 80 mg by mouth daily.      Marland Kitchen triamcinolone cream (KENALOG) 0.1 % Apply 1 application topically as needed.       . warfarin (COUMADIN) 5 MG tablet Take 1 tablet by mouth daily or as directed  90 tablet  1   No current facility-administered medications for this visit.    Allergies  Allergen Reactions  . Penicillins Rash    History   Social History  . Marital Status: Married    Spouse Name: N/A    Number of Children: N/A  . Years of Education: N/A   Occupational History  . Not on file.   Social History Main Topics  . Smoking status: Former Smoker    Quit date: 09/08/1984  . Smokeless tobacco: Former Systems developer  . Alcohol Use: No     Comment: HX of Alcohol abuse    Quit  in 2004  . Drug Use: No  . Sexual Activity: Not on file   Other Topics Concern  . Not on file  Social History Narrative  . No narrative on file     Review of Systems: General: negative for chills, fever, night sweats or weight changes.  Cardiovascular: negative for chest pain, dyspnea on exertion, edema, orthopnea, palpitations, paroxysmal nocturnal dyspnea or shortness of breath Dermatological: negative for rash Respiratory: negative for cough or wheezing Urologic: negative for hematuria Abdominal: negative for nausea, vomiting, diarrhea, bright red blood per rectum, melena, or hematemesis Neurologic: negative for visual changes, syncope, or dizziness All other systems reviewed and are otherwise negative except as noted above.    Blood pressure 112/56, pulse 70, height 5\' 9"  (1.753  m), weight 257 lb 9.6 oz (116.847 kg).  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 He had moderate CAD which we have been treating medically. The patient denies chest pain. His last Myoview stress test performed 02/16/13 was low risk.  Essential hypertension Controlled on current medications  Mixed hyperlipidemia with apolipoprotein E4 variant On statin therapy followed by his PCP      Raymond Harp MD [facility], [facility] 04/06/2014 9:58 AM

## 2014-04-06 NOTE — Patient Instructions (Signed)
Your physician recommends that you schedule a follow-up appointment in 6 months with an extender.  Dr Gwenlyn Found wants you to follow-up in 12 months. You will receive a reminder letter in the mail one month in advance. If you don't receive a letter, please call our office to schedule the follow-up appointment.

## 2014-04-06 NOTE — Assessment & Plan Note (Signed)
Controlled on current medications 

## 2014-04-06 NOTE — Assessment & Plan Note (Signed)
History of CAD status post cardiac catheterization performed by Dr. Alla German in August 2008 revealing an anomalous circumflex and normal LV function. He had moderate CAD which we have been treating medically. The patient denies chest pain. His last Myoview stress test performed 02/16/13 was low risk.

## 2014-04-06 NOTE — Assessment & Plan Note (Signed)
Chronic atrial fibrillation, rate controlled on Coumadin anticoagulation

## 2014-04-06 NOTE — Assessment & Plan Note (Signed)
On statin therapy followed by his PCP 

## 2014-04-25 ENCOUNTER — Ambulatory Visit: Payer: Medicare Other | Admitting: Pharmacist Clinician (PhC)/ Clinical Pharmacy Specialist

## 2014-04-29 ENCOUNTER — Telehealth: Payer: Self-pay | Admitting: Cardiovascular Disease

## 2014-04-29 NOTE — Telephone Encounter (Signed)
FYI .. Raymond Chaney is calling to say that he does not need his sleep machine back because he thinks he does not need it anymore . Would like to know the procedures for turning this back in .Marland Kitchen Please call .   Thanks

## 2014-04-29 NOTE — Telephone Encounter (Signed)
Returned call to patient he stated he does not use sleep apnea machine.Stated he don't think he needs to use it.Stated he called company and they are to pick it up next week.

## 2014-05-03 NOTE — Telephone Encounter (Signed)
Message has been sent to Dr. Claiborne Billings.

## 2014-05-04 ENCOUNTER — Ambulatory Visit: Payer: Medicare Other | Admitting: Physician Assistant

## 2014-05-13 ENCOUNTER — Ambulatory Visit (INDEPENDENT_AMBULATORY_CARE_PROVIDER_SITE_OTHER): Payer: Medicare Other | Admitting: Pharmacist Clinician (PhC)/ Clinical Pharmacy Specialist

## 2014-05-13 DIAGNOSIS — Z7901 Long term (current) use of anticoagulants: Secondary | ICD-10-CM

## 2014-05-13 DIAGNOSIS — I4891 Unspecified atrial fibrillation: Secondary | ICD-10-CM

## 2014-05-13 LAB — POCT INR: INR: 2.7

## 2014-06-10 ENCOUNTER — Ambulatory Visit (INDEPENDENT_AMBULATORY_CARE_PROVIDER_SITE_OTHER): Payer: Medicare Other | Admitting: Pharmacist Clinician (PhC)/ Clinical Pharmacy Specialist

## 2014-06-10 DIAGNOSIS — Z7901 Long term (current) use of anticoagulants: Secondary | ICD-10-CM

## 2014-06-10 DIAGNOSIS — I4891 Unspecified atrial fibrillation: Secondary | ICD-10-CM

## 2014-06-10 LAB — POCT INR: INR: 2.9

## 2014-06-16 ENCOUNTER — Other Ambulatory Visit: Payer: Self-pay | Admitting: Pharmacist Clinician (PhC)/ Clinical Pharmacy Specialist

## 2014-06-16 MED ORDER — WARFARIN SODIUM 5 MG PO TABS: ORAL_TABLET | ORAL | Status: DC

## 2014-07-08 ENCOUNTER — Ambulatory Visit (INDEPENDENT_AMBULATORY_CARE_PROVIDER_SITE_OTHER): Payer: Medicare Other | Admitting: Pharmacist Clinician (PhC)/ Clinical Pharmacy Specialist

## 2014-07-08 DIAGNOSIS — Z7901 Long term (current) use of anticoagulants: Secondary | ICD-10-CM

## 2014-07-08 DIAGNOSIS — I4891 Unspecified atrial fibrillation: Secondary | ICD-10-CM

## 2014-07-08 LAB — POCT INR: INR: 3.2

## 2014-07-29 ENCOUNTER — Ambulatory Visit (INDEPENDENT_AMBULATORY_CARE_PROVIDER_SITE_OTHER): Payer: Medicare Other | Admitting: Pharmacist Clinician (PhC)/ Clinical Pharmacy Specialist

## 2014-07-29 DIAGNOSIS — Z7901 Long term (current) use of anticoagulants: Secondary | ICD-10-CM

## 2014-07-29 DIAGNOSIS — I4891 Unspecified atrial fibrillation: Secondary | ICD-10-CM

## 2014-07-29 LAB — POCT INR: INR: 2.5

## 2014-08-10 ENCOUNTER — Telehealth: Payer: Self-pay | Admitting: Physician Assistant

## 2014-08-10 NOTE — Telephone Encounter (Signed)
Close encounter 

## 2014-08-12 ENCOUNTER — Ambulatory Visit (INDEPENDENT_AMBULATORY_CARE_PROVIDER_SITE_OTHER): Payer: Medicare Other | Admitting: Pharmacist Clinician (PhC)/ Clinical Pharmacy Specialist

## 2014-08-12 DIAGNOSIS — I4891 Unspecified atrial fibrillation: Secondary | ICD-10-CM

## 2014-08-12 DIAGNOSIS — Z7901 Long term (current) use of anticoagulants: Secondary | ICD-10-CM

## 2014-08-12 LAB — POCT INR: INR: 5.6

## 2014-08-19 ENCOUNTER — Ambulatory Visit (INDEPENDENT_AMBULATORY_CARE_PROVIDER_SITE_OTHER): Payer: Medicare Other | Admitting: Pharmacist Clinician (PhC)/ Clinical Pharmacy Specialist

## 2014-08-19 DIAGNOSIS — Z7901 Long term (current) use of anticoagulants: Secondary | ICD-10-CM

## 2014-08-19 DIAGNOSIS — I4891 Unspecified atrial fibrillation: Secondary | ICD-10-CM

## 2014-08-19 LAB — POCT INR: INR: 2.1

## 2014-08-23 ENCOUNTER — Other Ambulatory Visit: Payer: Self-pay | Admitting: Internal Medicine

## 2014-08-23 DIAGNOSIS — R042 Hemoptysis: Secondary | ICD-10-CM

## 2014-08-23 DIAGNOSIS — J449 Chronic obstructive pulmonary disease, unspecified: Secondary | ICD-10-CM

## 2014-08-25 ENCOUNTER — Ambulatory Visit
Admission: RE | Admit: 2014-08-25 | Discharge: 2014-08-25 | Disposition: A | Discharge status: Home / Self Care | Payer: Medicare Other | Source: Ambulatory Visit | Attending: Internal Medicine | Admitting: Internal Medicine

## 2014-08-25 DIAGNOSIS — R042 Hemoptysis: Secondary | ICD-10-CM

## 2014-08-25 DIAGNOSIS — J449 Chronic obstructive pulmonary disease, unspecified: Secondary | ICD-10-CM

## 2014-08-26 ENCOUNTER — Ambulatory Visit: Payer: Medicare Other | Admitting: Pharmacist Clinician (PhC)/ Clinical Pharmacy Specialist

## 2014-08-30 ENCOUNTER — Telehealth: Payer: Self-pay | Admitting: Internal Medicine

## 2014-08-30 ENCOUNTER — Other Ambulatory Visit (HOSPITAL_COMMUNITY): Payer: Self-pay | Admitting: Internal Medicine

## 2014-08-30 DIAGNOSIS — R918 Other nonspecific abnormal finding of lung field: Secondary | ICD-10-CM

## 2014-08-30 NOTE — Telephone Encounter (Signed)
GAVE NEW PT REFERRAL TO [facility]

## 2014-08-31 ENCOUNTER — Telehealth: Payer: Self-pay | Admitting: *Deleted

## 2014-08-31 ENCOUNTER — Ambulatory Visit (INDEPENDENT_AMBULATORY_CARE_PROVIDER_SITE_OTHER): Payer: Medicare Other | Admitting: Pharmacist Clinician (PhC)/ Clinical Pharmacy Specialist

## 2014-08-31 DIAGNOSIS — Z7901 Long term (current) use of anticoagulants: Secondary | ICD-10-CM

## 2014-08-31 DIAGNOSIS — I4891 Unspecified atrial fibrillation: Secondary | ICD-10-CM

## 2014-08-31 LAB — POCT INR: INR: 2.9

## 2014-08-31 NOTE — Telephone Encounter (Signed)
I spoke with referring office several time to set up PET scan.  PET scan is scheduled for 08/31/14.  I called [facility] to set patient up with thoracic surgery.  Patient has an appt on 09/06/14 at 3:15, I called patient with app time and place.  He verbalized understanding of appt

## 2014-09-01 ENCOUNTER — Ambulatory Visit (HOSPITAL_COMMUNITY)
Admission: RE | Admit: 2014-09-01 | Discharge: 2014-09-01 | Disposition: A | Discharge status: Home / Self Care | Payer: Medicare Other | Source: Ambulatory Visit | Attending: Internal Medicine | Admitting: Internal Medicine

## 2014-09-01 DIAGNOSIS — R938 Abnormal findings on diagnostic imaging of other specified body structures: Secondary | ICD-10-CM | POA: Insufficient documentation

## 2014-09-01 DIAGNOSIS — R918 Other nonspecific abnormal finding of lung field: Secondary | ICD-10-CM

## 2014-09-01 LAB — GLUCOSE, CAPILLARY: Glucose-Capillary: 91 mg/dL (ref 70–99)

## 2014-09-01 MED ORDER — FLUDEOXYGLUCOSE F - 18 (FDG) INJECTION
13.2000 | Freq: Once | INTRAVENOUS | Status: AC | PRN
  Administered 2014-09-01: 13.2 via INTRAVENOUS

## 2014-09-05 ENCOUNTER — Ambulatory Visit (INDEPENDENT_AMBULATORY_CARE_PROVIDER_SITE_OTHER): Payer: Medicare Other | Admitting: Surgery

## 2014-09-05 ENCOUNTER — Encounter: Payer: Self-pay | Admitting: Surgery

## 2014-09-05 VITALS — BP 135/69 | HR 88 | Resp 22 | Ht 69.0 in | Wt 250.0 lb

## 2014-09-05 DIAGNOSIS — C3490 Malignant neoplasm of unspecified part of unspecified bronchus or lung: Secondary | ICD-10-CM

## 2014-09-05 NOTE — Progress Notes (Signed)
PCP is Tivis Ringer, MD Referring Provider is Curt Bears, MD  Chief Complaint  Patient presents with  . Lung Cancer    Lung cancer, discuss surgery, PET and CT chest done    HPI:  The patient is an 80 year old gentleman with a long smoking history who quit 20 years ago but has significant COPD and OSA. He also has diabetes, hypertension, prior stroke, morbid obesity, hyperlipidemia, chronic atrial fibrillation on coumadin, moderate coronary disease by cath in 2008 and low risk myoview in 2011 treated medically. He reports about a year history of worsening shortness of breath with exertion. In November 2015 he began having blood-tinged sputum and this has persisted. He had a CXR showing a RUL density and CTscan showed a 2.6 x 3.3 cm cavitary mass in the RUL laterally with a slight pulmonary parenchymal haziness around the lesion. A PET scan showed hypermetabolic uptake in the lesion with an SUV max of 19. There were no other areas of significant uptake. There was nonspecific uptake in the prostate that may reflect prostatitis and the patient was treated for epididymitis recently.  Past Medical History  Diagnosis Date  . Arthritis   . CHF (congestive heart failure)   . COPD (chronic obstructive pulmonary disease)   . Diabetes mellitus without complication   . Hypertension   . Stroke   . Gout   . Alcohol abuse   . GIB (gastrointestinal bleeding)   . Complication of anesthesia     2004   stopped breathing  3 times  on the table "  . Shortness of breath   . Sleep apnea 09/08/2012    sleep study 08/20/12-ild sleep apnea with an AHI of 7.8/hr and durimg REM 27.2/hr: CPAP 10/04/12-tried nasal pillows and face mask, but couldnt tolerate  . Atrial fibrillation     chronic on coumadin; monitor 05/20/11- AFib    . DVT, lower extremity      2005  . Hyperlipidemia   . CAD (coronary artery disease)     myoview 10/19/09-diaphragmatic attenuation vs inferior scar without ischemia;  echo 09/11/12-Ef 40-45%, L atrium mod to severely dilated    Past Surgical History  Procedure Laterality Date  . Total knee arthroplasty    . Colon surgery      colon polyps  . Hip surgery    . 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 Cardiac catheterization  10/11/02    noncritical CAD, EF 45-50%  . Cardiac catheterization  08/22/99    no significant CAD, nl EF    Family History  Problem Relation Age of Onset  . Heart attack Father   . Diabetes Father   . Dementia Mother   . Cancer Sister   . Cancer Brother   . Heart disease Brother   . Cancer Brother   . Cancer Brother   . Cancer Sister     Social History History  Substance Use Topics  . Smoking status: Former Smoker    Quit date: 09/08/1984  . Smokeless tobacco: Former Systems developer  . Alcohol Use: No     Comment: HX of Alcohol abuse    Quit  in 2004    Current Outpatient Prescriptions  Medication Sig Dispense Refill  . allopurinol (ZYLOPRIM) 100 MG tablet Take 100 mg by mouth daily.    . Ascorbic Acid (VITAMIN C) 1000 MG tablet Take 1,000 mg by mouth daily.    Marland Kitchen  doxazosin (CARDURA) 4 MG tablet Take 4 mg by mouth daily.    Marland Kitchen HYDROcodone-acetaminophen (NORCO) 10-325 MG per tablet Take by mouth every 6 (six) hours as needed.  0  . lisinopril (PRINIVIL,ZESTRIL) 10 MG tablet Take 10 mg by mouth daily.    . metFORMIN (GLUCOPHAGE) 1000 MG tablet Take 1,000 mg by mouth daily with breakfast.    . metoprolol tartrate (LOPRESSOR) 50 MG tablet Take 1 tablet (50 mg total) by mouth daily. 60 tablet 0  . pravastatin (PRAVACHOL) 80 MG tablet Take 80 mg by mouth daily.    Marland Kitchen warfarin (COUMADIN) 5 MG tablet Take 1 tablet by mouth daily or as directed 90 tablet 3   No current facility-administered medications for this visit.    Allergies  Allergen Reactions  . Penicillins Rash    Review of Systems  Constitutional: Negative for fever, chills, activity change, appetite change,  fatigue and unexpected weight change.  HENT: Positive for congestion and hearing loss.        Dentures and hearing aids  Eyes: Negative.   Respiratory: Positive for cough and shortness of breath. Negative for chest tightness.        Hemoptysis ( blood-tinged sputum)  Cardiovascular: Positive for palpitations. Negative for chest pain and leg swelling.  Gastrointestinal: Negative.   Endocrine: Negative.   Genitourinary: Positive for frequency.  Musculoskeletal: Positive for myalgias, arthralgias and gait problem.  Skin: Negative.   Allergic/Immunologic: Negative.   Neurological: Positive for numbness. Negative for seizures, syncope, speech difficulty and headaches.  Hematological: Bruises/bleeds easily.  Psychiatric/Behavioral: Negative.     BP 135/69 mmHg  Pulse 88  Resp 22  Ht 5\' 9"  (1.753 m)  Wt 250 lb (113.399 kg)  BMI 36.90 kg/m2  SpO2 98% Physical Exam  Constitutional: He is oriented to person, place, and time.  Obese white male in no distress  HENT:  Head: Normocephalic and atraumatic.  Mouth/Throat: Oropharynx is clear and moist.  Eyes: EOM are normal. Pupils are equal, round, and reactive to light.  Neck: Normal range of motion. Neck supple. No tracheal deviation present. No thyromegaly present.  No supraclavicular adenopathy  Cardiovascular: Normal rate.   Murmur heard. Irregularly irregular rhythm  Pulmonary/Chest: Effort normal and breath sounds normal. No respiratory distress. He has no wheezes. He has no rales. He exhibits no tenderness.  Abdominal: Soft. Bowel sounds are normal. He exhibits no mass. There is no tenderness.  Musculoskeletal: He exhibits no edema or tenderness.  Lymphadenopathy:    He has no cervical adenopathy.  Neurological: He is alert and oriented to person, place, and time. He has normal strength.  Skin: Skin is dry.  Psychiatric: He has a normal mood and affect.     Diagnostic Tests:  CLINICAL DATA: Along the cyst. COPD. Chronic  bronchitis.  EXAM: CT CHEST WITHOUT CONTRAST  TECHNIQUE: Multidetector CT imaging of the chest was performed following the standard protocol without IV contrast.  COMPARISON: Chest x-ray dated 09/10/2013  FINDINGS: There is a 3.3 x 3.3 x 2.6 cm cavitary mass in the right upper lobe laterally with slight a pulmonary parenchymal haziness surrounding the lesion, which probably represents hemorrhage into the adjacent soft tissues.  The lungs are otherwise clear. No visible hilar or mediastinal adenopathy. Moderate calcification in the coronary arteries. No significant abnormality in the visualized portion of the upper abdomen. No acute osseous abnormality.  IMPRESSION: 3.3 cm cavitary spiculated mass in the right upper lobe consistent with primary carcinoma of the ung with probable parenchymal  hemorrhage around the mass.   Electronically Signed  By: Rozetta Nunnery M.D.  On: 08/25/2014 16:00     CLINICAL DATA: Initial treatment strategy for pulmonary nodule.  EXAM: NUCLEAR MEDICINE PET SKULL BASE TO THIGH  TECHNIQUE: 13.2 mCi F-18 FDG was injected intravenously. Full-ring PET imaging was performed from the skull base to thigh after the radiotracer. CT data was obtained and used for attenuation correction and anatomic localization.  FASTING BLOOD GLUCOSE: Value: 91 mg/dl  COMPARISON: None  FINDINGS: NECK  No hypermetabolic lymph nodes in the neck.  CHEST  Atherosclerotic disease involves the thoracic aorta as well as the RCA, LAD and left circumflex coronary arteries. No hypermetabolic mediastinal or hilar lymph nodes identified.  No pleural effusion. Right upper lobe cavitary lesion measures 3.3 cm and has an SUV max equal to 19. No additional pulmonary nodules or mass is identified.  ABDOMEN/PELVIS  No abnormal hypermetabolic activity within the liver, pancreas, adrenal glands, or spleen. Atherosclerotic disease involves  the abdominal aorta. The aorta measures 3 cm in AP dimension. There is a filter within the IVC. No hypermetabolic lymph nodes in the abdomen or pelvis. Nonspecific increased uptake is identified along the posterior aspect of the prostate gland.  SKELETON  No focal hypermetabolic activity to suggest skeletal metastasis.  IMPRESSION: 1. Right upper lobe, cavitary lesion exhibits intense FDG uptake. Assuming non-small cell lung cancer this would be consistent with a T2aN0M0 lesion or stage IB tumor. An infectious or inflammatory process may also exhibit intense FDG uptake. Correlation with biopsy is recommended. 2. 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 Recommend follow up by [place] in 3years. This recommendation follows ACR consensus guidelines: White Paper of the ACR Incidental Findings Committee II on Vascular Findings. Joellyn Rued [id number]; [id number]. 3. Nonspecific increased uptake within the posterior aspect of the prostate gland may reflect prostatitis. PET-CT is generally not used to assess for prostate cancer. Consider correlation with patient's PSA.   Electronically Signed  By: Kerby Moors M.D.  On: 09/01/2014 09:14      Impression:  He has a 2.6 x 3.3 cm cavitary lesion in the RUL with blood-tinged sputum since November that is intensely hypermetabolic on PET scan. This lesion was not seen on his last CXR dated 09/10/2013 but I think I can make out a slight outline of the cavity on that CXR. This is suspicious for a bronchogenic carcinoma but could also be an infectious lesion such as AFB or fungus. It could also be an inflammatory lesion. He is not a candidate for surgical resection at his age with his medical problems and this would require a right upper lobectomy for complete resection. I think it would be worthwhile doing a CT-guided needle biopsy to try to determine if  this is a cancer that could be treated with XRT. He is on coumadin for chronic atrial fibrillation which would have to be held to do the biopsy. A bronchoscopic biopsy would be low yield for this peripheral lesion although an electromagnetic navigation bronchoscopic biopsy could be done. Coumadin would also have to be stopped for this and it would require general anesthesia. Therefore,I think a CT-guided biopsy is probably the best option. We may be able to pick up a fungus or AFB on a biopsy. The only other option would be continued follow up with no biopsy at this time but that runs the risk of progression and spread if this is a cancer and the  hemoptysis may continue if we don't figure out what this is. I reviewed the CT and PET scans with the patient and his wife and discussed my impression and recommendation. They would like to proceed with a biopsy.  Plan:  We will schedule a CT-guided biopsy of the RUL lung lesion with sample sent to pathology and micro for AFB and fungus. I will see him back afterwards to discuss the results and decide on further plans.

## 2014-09-06 ENCOUNTER — Other Ambulatory Visit: Payer: Self-pay

## 2014-09-06 ENCOUNTER — Encounter: Payer: Medicare Other | Admitting: Cardiothoracic Surgery

## 2014-09-06 DIAGNOSIS — C3490 Malignant neoplasm of unspecified part of unspecified bronchus or lung: Secondary | ICD-10-CM

## 2014-09-07 ENCOUNTER — Telehealth: Payer: Self-pay | Admitting: Pharmacist Clinician (PhC)/ Clinical Pharmacy Specialist

## 2014-09-07 NOTE — Telephone Encounter (Signed)
Yes, okayed a bridge

## 2014-09-07 NOTE — Telephone Encounter (Signed)
Call from [facility], pt needs to have lung biopsy of mass in RUL.  Request for patient to be off warfarin x 4 days prior.    Pt has CHADS2 score of 5, would prefer to bridge for procedure.  OK to schedule?

## 2014-09-12 ENCOUNTER — Other Ambulatory Visit: Payer: Self-pay | Admitting: Radiology

## 2014-09-12 ENCOUNTER — Ambulatory Visit: Payer: Medicare Other | Admitting: Physician Assistant

## 2014-09-12 ENCOUNTER — Encounter: Payer: Self-pay | Admitting: Physician Assistant

## 2014-09-12 ENCOUNTER — Ambulatory Visit (INDEPENDENT_AMBULATORY_CARE_PROVIDER_SITE_OTHER): Payer: Medicare Other | Admitting: Pharmacist Clinician (PhC)/ Clinical Pharmacy Specialist

## 2014-09-12 ENCOUNTER — Ambulatory Visit (INDEPENDENT_AMBULATORY_CARE_PROVIDER_SITE_OTHER): Payer: Medicare Other | Admitting: Physician Assistant

## 2014-09-12 VITALS — BP 132/60 | HR 80 | Ht 69.0 in | Wt 257.2 lb

## 2014-09-12 DIAGNOSIS — I482 Chronic atrial fibrillation, unspecified: Secondary | ICD-10-CM

## 2014-09-12 DIAGNOSIS — I4891 Unspecified atrial fibrillation: Secondary | ICD-10-CM

## 2014-09-12 DIAGNOSIS — I10 Essential (primary) hypertension: Secondary | ICD-10-CM

## 2014-09-12 DIAGNOSIS — G4733 Obstructive sleep apnea (adult) (pediatric): Secondary | ICD-10-CM

## 2014-09-12 DIAGNOSIS — E785 Hyperlipidemia, unspecified: Secondary | ICD-10-CM

## 2014-09-12 DIAGNOSIS — I2583 Coronary atherosclerosis due to lipid rich plaque: Secondary | ICD-10-CM

## 2014-09-12 DIAGNOSIS — Z7901 Long term (current) use of anticoagulants: Secondary | ICD-10-CM

## 2014-09-12 DIAGNOSIS — I2510 Atherosclerotic heart disease of native coronary artery without angina pectoris: Secondary | ICD-10-CM

## 2014-09-12 LAB — POCT INR: INR: 1.9

## 2014-09-12 MED ORDER — ENOXAPARIN SODIUM 120 MG/0.8ML ~~LOC~~ SOLN: 120.0000 mg | Freq: Two times a day (BID) | SUBCUTANEOUS | Status: DC

## 2014-09-12 NOTE — Assessment & Plan Note (Addendum)
No complaints of angina.

## 2014-09-12 NOTE — Assessment & Plan Note (Signed)
Uses nocturnal oxygen with nasal cannula however he does not tolerate CPAP

## 2014-09-12 NOTE — Assessment & Plan Note (Signed)
Rate controlled. He is bringing bridged with Lovenox for upcoming lung biopsy.

## 2014-09-12 NOTE — Patient Instructions (Signed)
Your physician wants you to follow-up in 6 months with Dr. Berry. You will receive a reminder letter in the mail 2 months in advance. If you do not receive a letter, please call our office to schedule the follow-up appointment.  

## 2014-09-12 NOTE — Assessment & Plan Note (Signed)
Blood pressure controlled. No changes in therapy

## 2014-09-12 NOTE — Patient Instructions (Addendum)
Enoxaprin Dosing Schedule  Enoxparin dose:120 mg  Date  Warfarin Dose (evenings) Enoxaprin Dose   6     5     4     1-4 3 0            9 pm  1-5 2 0 9 am   9 pm  1-6 1 0 9 am  1-7 Procedure 5mg   (1 tab)   1-8 1 7.5mg  (1.5 tabs) 9 am   9 pm  1-9 2 7.5mg  (1.5 tabs) 9 am   9 pm  1-10 3 5mg   (1 tab) 9 am   9 pm  1-11 4 5mg   (1 tab) 9 am   9 pm  1-12 5 5mg   (1 tab) 9 am   9 pm  1-13 6 Repeat INR

## 2014-09-12 NOTE — Assessment & Plan Note (Signed)
Followed by Dr. Dagmar Hait

## 2014-09-12 NOTE — Progress Notes (Signed)
Patient ID: Raymond Chaney, male   DOB: [birth year], 81 y.o.   MRN: [id number]    Date:  09/12/2014   ID:  Raymond Chaney, DOB April 03, 1932, MRN [id number]  PCP:  Tivis Ringer, MD  Primary Cardiologist:  Gwenlyn Found Chief Complaint: Six-month follow-up   History of Present Illness: Raymond Chaney is a 81 y.o. mildly overweight married Caucasian male, father of 9 and grandfather of 6 grandchildren. He has a history of moderate CAD by catheterization performed by Dr. 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 His other problems include COPD with remote tobacco abuse, having quit 20 years ago, with dyspnea on exertion. He has hypertension, hyperlipidemia, insulin-dependent diabetes, and chronic atrial fibrillation, on Coumadin anticoagulation. He does have diabetic peripheral neuropathy by symptoms. His last Myoview, performed October 19, 2009, showed diaphragmatic attenuation versus inferior scar without ischemia. He has an excellent lipid profile performed by Dr. Dagmar Hait for secondary prevention. He is followed with obstructive sleep apnea by Dr. Claiborne Billings. Unfortunately the patient is unable to wear the CPAP.  He had a Myoview stress test performed 02/16/13 which was low risk. His breathing is somewhat improved with wearing nocturnal oxygen.  2-D echocardiography performed 09/10/12 revealed an ejection fraction of 40-45%.  Patient presents for six-month evaluation.  He is scheduled to have a lung biopsy.  Coumadin has been held and the patient will be bridged with Lovenox. He reports some swelling on his right ankle lateral and posterior he thinks it's been there for 2-3 week months however could've been there much longer.  He just happened to notice it. It is nontender with no discoloration. Denies shortness of breath more than usual. He continues to use his oxygen at night most times.  The patient currently denies nausea, vomiting, fever, chest pain,  orthopnea,  dizziness, PND, cough, congestion, abdominal pain, hematochezia, melena, lower extremity edema, claudication.  Wt Readings from Last 3 Encounters:  09/12/14 257 lb 3.2 oz (116.665 kg)  09/05/14 250 lb (113.399 kg)  04/06/14 257 lb 9.6 oz (116.847 kg)     Past Medical History  Diagnosis Date  . Arthritis   . CHF (congestive heart failure)   . COPD (chronic obstructive pulmonary disease)   . Diabetes mellitus without complication   . Hypertension   . Stroke   . Gout   . Alcohol abuse   . GIB (gastrointestinal bleeding)   . Complication of anesthesia     2004   stopped breathing  3 times  on the table "  . Shortness of breath   . Sleep apnea 09/08/2012    sleep study 08/20/12-ild sleep apnea with an AHI of 7.8/hr and durimg REM 27.2/hr: CPAP 10/04/12-tried nasal pillows and face mask, but couldnt tolerate  . Atrial fibrillation     chronic on coumadin; monitor 05/20/11- AFib    . DVT, lower extremity      2005  . Hyperlipidemia   . CAD (coronary artery disease)     myoview 10/19/09-diaphragmatic attenuation vs inferior scar without ischemia; echo 09/11/12-Ef 40-45%, L atrium mod to severely dilated    Current Outpatient Prescriptions  Medication Sig Dispense Refill  . allopurinol (ZYLOPRIM) 100 MG tablet Take 100 mg by mouth daily.    . Ascorbic Acid (VITAMIN C) 1000 MG tablet Take 1,000 mg by mouth daily.    Marland Kitchen doxazosin (CARDURA) 4 MG tablet Take 4 mg by mouth daily.    Marland Kitchen lisinopril (PRINIVIL,ZESTRIL) 10 MG tablet Take 10 mg  by mouth daily.    . metFORMIN (GLUCOPHAGE) 1000 MG tablet Take 1,000 mg by mouth daily with breakfast.    . metoprolol tartrate (LOPRESSOR) 50 MG tablet Take 1 tablet (50 mg total) by mouth daily. 60 tablet 0  . mirabegron ER (MYRBETRIQ) 50 MG TB24 tablet Take 50 mg by mouth daily.    . pravastatin (PRAVACHOL) 80 MG tablet Take 80 mg by mouth daily.    Marland Kitchen warfarin (COUMADIN) 5 MG tablet Take 1 tablet by mouth daily or as directed (Patient taking differently:  Take 2.5-5 mg by mouth daily. Take 1/2 tablet (2.5 mg) on Monday, Wednesday, Friday, take 1 tablet (5 mg) on Sunday, Tuesday, Thursday, Saturday) 90 tablet 3  . enoxaparin (LOVENOX) 120 MG/0.8ML injection Inject 0.8 mLs (120 mg total) into the skin every 12 (twelve) hours. 14 Syringe 0   No current facility-administered medications for this visit.    Allergies:    Allergies  Allergen Reactions  . Penicillins Rash    Social History:  The patient  reports that he quit smoking about 30 years ago. He has quit using smokeless tobacco. He reports that he does not drink alcohol or use illicit drugs.   Family history:   Family History  Problem Relation Age of Onset  . Heart attack Father   . Diabetes Father   . Dementia Mother   . Cancer Sister   . Cancer Brother   . Heart disease Brother   . Cancer Brother   . Cancer Brother   . Cancer Sister     ROS:  Please see the history of present illness.  All other systems reviewed and negative.   PHYSICAL EXAM: VS:  BP 132/60 mmHg  Pulse 80  Ht 5\' 9"  (1.753 m)  Wt 257 lb 3.2 oz (116.665 kg)  BMI 37.96 kg/m2 Well nourished, well developed, in no acute distress HEENT: Pupils are equal round react to light accommodation extraocular movements are intact.  Neck: no JVDNo cervical lymphadenopathy. Cardiac: Irregular rate and rhythm without murmurs rubs or gallops. Lungs:  clear to auscultation bilaterally, no wheezing, rhonchi or rales Abd: soft, nontender, positive bowel sounds all quadrants, no hepatosplenomegaly Ext: no lower extremity edema.  2+ radial and dorsalis pedis pulses. There is a small to moderate size area of edema on the right ankle lateral and posterior is nontender. No ecchymosis or erythema Skin: warm and dry Neuro:  Grossly normal  EKG: atrial fibrillation with a rate of 80 bpm  ASSESSMENT AND PLAN:  Problem List Items Addressed This Visit    Obstructive sleep apnea    Uses nocturnal oxygen with nasal cannula however  he does not tolerate CPAP    Hyperlipidemia    Followed by Dr. Dagmar Hait     Essential hypertension    Blood pressure controlled. 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    Atrial fibrillation - Primary (Chronic)    Rate controlled. He is bringing bridged with Lovenox for upcoming lung biopsy.        Edema right ankle: Patient will follow up with PCP. It is nontender no erythema or ecchymosis. Can consider ultrasound

## 2014-09-13 ENCOUNTER — Other Ambulatory Visit: Payer: Self-pay | Admitting: Radiology

## 2014-09-14 ENCOUNTER — Telehealth (HOSPITAL_COMMUNITY): Payer: Self-pay | Admitting: *Deleted

## 2014-09-14 ENCOUNTER — Other Ambulatory Visit: Payer: Self-pay | Admitting: Radiology

## 2014-09-15 ENCOUNTER — Encounter (HOSPITAL_COMMUNITY): Payer: Self-pay

## 2014-09-15 ENCOUNTER — Ambulatory Visit (HOSPITAL_COMMUNITY)
Admission: RE | Admit: 2014-09-15 | Discharge: 2014-09-15 | Disposition: A | Discharge status: Home / Self Care | Payer: Medicare Other | Source: Ambulatory Visit | Attending: Surgery | Admitting: Surgery

## 2014-09-15 ENCOUNTER — Encounter (HOSPITAL_COMMUNITY)
Admission: RE | Admit: 2014-09-15 | Discharge: 2014-09-15 | Disposition: A | Discharge status: Home / Self Care | Payer: Medicare Other | Source: Ambulatory Visit | Attending: Interventional Radiology | Admitting: Interventional Radiology

## 2014-09-15 DIAGNOSIS — Z79899 Other long term (current) drug therapy: Secondary | ICD-10-CM | POA: Diagnosis not present

## 2014-09-15 DIAGNOSIS — Z87891 Personal history of nicotine dependence: Secondary | ICD-10-CM | POA: Diagnosis not present

## 2014-09-15 DIAGNOSIS — E119 Type 2 diabetes mellitus without complications: Secondary | ICD-10-CM | POA: Insufficient documentation

## 2014-09-15 DIAGNOSIS — I509 Heart failure, unspecified: Secondary | ICD-10-CM | POA: Insufficient documentation

## 2014-09-15 DIAGNOSIS — R0602 Shortness of breath: Secondary | ICD-10-CM | POA: Diagnosis present

## 2014-09-15 DIAGNOSIS — I2510 Atherosclerotic heart disease of native coronary artery without angina pectoris: Secondary | ICD-10-CM | POA: Insufficient documentation

## 2014-09-15 DIAGNOSIS — I10 Essential (primary) hypertension: Secondary | ICD-10-CM | POA: Insufficient documentation

## 2014-09-15 DIAGNOSIS — R042 Hemoptysis: Secondary | ICD-10-CM | POA: Diagnosis present

## 2014-09-15 DIAGNOSIS — J939 Pneumothorax, unspecified: Secondary | ICD-10-CM

## 2014-09-15 DIAGNOSIS — R911 Solitary pulmonary nodule: Secondary | ICD-10-CM | POA: Diagnosis not present

## 2014-09-15 DIAGNOSIS — Z7901 Long term (current) use of anticoagulants: Secondary | ICD-10-CM | POA: Diagnosis not present

## 2014-09-15 DIAGNOSIS — E785 Hyperlipidemia, unspecified: Secondary | ICD-10-CM | POA: Diagnosis not present

## 2014-09-15 DIAGNOSIS — J449 Chronic obstructive pulmonary disease, unspecified: Secondary | ICD-10-CM | POA: Insufficient documentation

## 2014-09-15 DIAGNOSIS — Z8673 Personal history of transient ischemic attack (TIA), and cerebral infarction without residual deficits: Secondary | ICD-10-CM | POA: Insufficient documentation

## 2014-09-15 DIAGNOSIS — C3490 Malignant neoplasm of unspecified part of unspecified bronchus or lung: Secondary | ICD-10-CM

## 2014-09-15 LAB — PROTIME-INR
INR: 1.19 (ref 0.00–1.49)
PROTHROMBIN TIME: 15.2 s (ref 11.6–15.2)

## 2014-09-15 LAB — CBC
HCT: 37.6 % — ABNORMAL LOW (ref 39.0–52.0)
HEMOGLOBIN: 12.2 g/dL — AB (ref 13.0–17.0)
MCH: 31.4 pg (ref 26.0–34.0)
MCHC: 32.4 g/dL (ref 30.0–36.0)
MCV: 96.9 fL (ref 78.0–100.0)
Platelets: 135 10*3/uL — ABNORMAL LOW (ref 150–400)
RBC: 3.88 MIL/uL — ABNORMAL LOW (ref 4.22–5.81)
RDW: 13.6 % (ref 11.5–15.5)
WBC: 5.2 10*3/uL (ref 4.0–10.5)

## 2014-09-15 LAB — GLUCOSE, CAPILLARY: GLUCOSE-CAPILLARY: 98 mg/dL (ref 70–99)

## 2014-09-15 LAB — APTT: aPTT: 30 seconds (ref 24–37)

## 2014-09-15 MED ORDER — FENTANYL CITRATE 0.05 MG/ML IJ SOLN
INTRAMUSCULAR | Status: AC
  Filled 2014-09-15: qty 2

## 2014-09-15 MED ORDER — FENTANYL CITRATE 0.05 MG/ML IJ SOLN
INTRAMUSCULAR | Status: AC | PRN
  Administered 2014-09-15: 25 ug via INTRAVENOUS

## 2014-09-15 MED ORDER — MIDAZOLAM HCL 2 MG/2ML IJ SOLN
INTRAMUSCULAR | Status: AC
  Filled 2014-09-15: qty 2

## 2014-09-15 MED ORDER — HYDROCODONE-ACETAMINOPHEN 5-325 MG PO TABS
ORAL_TABLET | ORAL | Status: AC
  Filled 2014-09-15: qty 1

## 2014-09-15 MED ORDER — HYDROCODONE-ACETAMINOPHEN 5-325 MG PO TABS
1.0000 | ORAL_TABLET | Freq: Once | ORAL | Status: AC
  Administered 2014-09-15: 1 via ORAL
  Filled 2014-09-15: qty 1

## 2014-09-15 MED ORDER — LIDOCAINE-EPINEPHRINE 1 %-1:100000 IJ SOLN
INTRAMUSCULAR | Status: AC
  Filled 2014-09-15: qty 1

## 2014-09-15 MED ORDER — SODIUM CHLORIDE 0.9 % IV SOLN: Freq: Once | INTRAVENOUS | Status: DC

## 2014-09-15 MED ORDER — MIDAZOLAM HCL 2 MG/2ML IJ SOLN
INTRAMUSCULAR | Status: AC | PRN
  Administered 2014-09-15: 0.5 mg via INTRAVENOUS

## 2014-09-15 NOTE — Sedation Documentation (Signed)
Patient denies pain and is resting comfortably.  

## 2014-09-15 NOTE — H&P (Signed)
Chief Complaint: Rt lung mass Sob; bloody sputum  Referring Physician(s): Bartle,Bryan K  History of Present Illness: Raymond Chaney is a 81 y.o. male  Pt with worsening shortness of breath x several months Bloody sputum since Nov 2015 Work up reveals Rt lung mass +PET 09/01/14 Request for bx per Dr Cyndia Bent Hx atrial fib--on coumadin Has stopped for 3 days prior to bx and bridged with Lovenox LD Lovenox inj 1/6 8 am Pt did take coumadin this am Dr Pascal Lux aware----awaiting INR check this am    Past Medical History  Diagnosis Date  . Arthritis   . CHF (congestive heart failure)   . COPD (chronic obstructive pulmonary disease)   . Diabetes mellitus without complication   . Hypertension   . Stroke   . Gout   . Alcohol abuse   . GIB (gastrointestinal bleeding)   . Complication of anesthesia     2004   stopped breathing  3 times  on the table "  . Shortness of breath   . Sleep apnea 09/08/2012    sleep study 08/20/12-ild sleep apnea with an AHI of 7.8/hr and durimg REM 27.2/hr: CPAP 10/04/12-tried nasal pillows and face mask, but couldnt tolerate  . Atrial fibrillation     chronic on coumadin; monitor 05/20/11- AFib    . DVT, lower extremity      2005  . Hyperlipidemia   . CAD (coronary artery disease)     myoview 10/19/09-diaphragmatic attenuation vs inferior scar without ischemia; echo 09/11/12-Ef 40-45%, L atrium mod to severely dilated    Past Surgical History  Procedure Laterality Date  . Total knee arthroplasty    . Colon surgery      colon polyps  . Hip surgery    . 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 Cardiac catheterization  10/11/02    noncritical CAD, EF 45-50%  . Cardiac catheterization  08/22/99    no significant CAD, nl EF    Allergies: Penicillins  Medications: Prior to Admission medications   Medication Sig Start Date End Date Taking? Authorizing Provider  allopurinol (ZYLOPRIM)  100 MG tablet Take 100 mg by mouth daily.   Yes Historical Provider, MD  Ascorbic Acid (VITAMIN C) 1000 MG tablet Take 1,000 mg by mouth daily.   Yes Historical Provider, MD  doxazosin (CARDURA) 4 MG tablet Take 4 mg by mouth daily.   Yes Historical Provider, MD  lisinopril (PRINIVIL,ZESTRIL) 10 MG tablet Take 10 mg by mouth daily.   Yes Historical Provider, MD  metFORMIN (GLUCOPHAGE) 1000 MG tablet Take 1,000 mg by mouth daily with breakfast.   Yes Historical Provider, MD  metoprolol tartrate (LOPRESSOR) 50 MG tablet Take 1 tablet (50 mg total) by mouth daily. 09/12/12  Yes Thurnell Lose, MD  mirabegron ER (MYRBETRIQ) 50 MG TB24 tablet Take 50 mg by mouth daily.   Yes Historical Provider, MD  pravastatin (PRAVACHOL) 80 MG tablet Take 80 mg by mouth daily.   Yes Historical Provider, MD  warfarin (COUMADIN) 5 MG tablet Take 1 tablet by mouth daily or as directed Patient taking differently: Take 2.5-5 mg by mouth daily. Take 1/2 tablet (2.5 mg) on Monday, Wednesday, Friday, take 1 tablet (5 mg) on Sunday, Tuesday, Thursday, Saturday 06/16/14  Yes Kristin Alvstad, RPH-CPP  enoxaparin (LOVENOX) 120 MG/0.8ML injection Inject 0.8 mLs (120 mg total) into the skin every 12 (twelve) hours. 09/12/14   Lorretta Harp, MD  Family History  Problem Relation Age of Onset  . Heart attack Father   . Diabetes Father   . Dementia Mother   . Cancer Sister   . Cancer Brother   . Heart disease Brother   . Cancer Brother   . Cancer Brother   . Cancer Sister     History   Social History  . Marital Status: Married    Spouse Name: N/A    Number of Children: N/A  . Years of Education: N/A   Social History Main Topics  . Smoking status: Former Smoker    Quit date: 09/08/1984  . Smokeless tobacco: Former Systems developer  . Alcohol Use: No     Comment: HX of Alcohol abuse    Quit  in 2004  . Drug Use: No  . Sexual Activity: None   Other Topics Concern  . None   Social History Narrative     Review of  Systems: A 12 point ROS discussed and pertinent positives are indicated in the HPI above.  All other systems are negative.  Review of Systems  Constitutional: Negative for activity change, appetite change and unexpected weight change.  Respiratory: Positive for chest tightness and shortness of breath.   Cardiovascular: Negative for chest pain.  Gastrointestinal: Negative for nausea and vomiting.  Musculoskeletal: Negative for back pain.  Neurological: Positive for weakness.  Psychiatric/Behavioral: Negative for behavioral problems and confusion.     Vital Signs: BP 126/59 mmHg  Pulse 71  Temp(Src) 97.5 F (36.4 C) (Oral)  Resp 16  Ht 5\' 9"  (1.753 m)  Wt 113.399 kg (250 lb)  BMI 36.90 kg/m2  SpO2 95%  Physical Exam  Constitutional: He is oriented to person, place, and time.  Cardiovascular: Regular rhythm.   No murmur heard. Slightly irreg  Pulmonary/Chest: Effort normal and breath sounds normal. He has no wheezes.  Abdominal: Soft. Bowel sounds are normal. There is no tenderness.  Musculoskeletal: Normal range of motion.  Neurological: He is alert and oriented to person, place, and time.  Skin: Skin is warm and dry.  Psychiatric: He has a normal mood and affect. His behavior is normal. Thought content normal.    Imaging: Ct Chest Wo Contrast  08/25/2014   CLINICAL DATA:  Along the cyst.  COPD.  Chronic bronchitis.  EXAM: CT CHEST WITHOUT CONTRAST  TECHNIQUE: Multidetector CT imaging of the chest was performed following the standard protocol without IV contrast.  COMPARISON:  Chest x-ray dated 09/10/2013  FINDINGS: There is a 3.3 x 3.3 x 2.6 cm cavitary mass in the right upper lobe laterally with slight a pulmonary parenchymal haziness surrounding the lesion, which probably represents hemorrhage into the adjacent soft tissues.  The lungs are otherwise clear. No visible hilar or mediastinal adenopathy. Moderate calcification in the coronary arteries. No significant abnormality in  the visualized portion of the upper abdomen. No acute osseous abnormality.  IMPRESSION: 3.3 cm cavitary spiculated mass in the right upper lobe consistent with primary carcinoma of the ung with probable parenchymal hemorrhage around the mass.   Electronically Signed   By: Rozetta Nunnery M.D.   On: 08/25/2014 16:00   Nm Pet Image Initial (pi) Skull Base To Thigh  09/01/2014   CLINICAL DATA:  Initial treatment strategy for pulmonary nodule.  EXAM: NUCLEAR MEDICINE PET SKULL BASE TO THIGH  TECHNIQUE: 13.2 mCi F-18 FDG was injected intravenously. Full-ring PET imaging was performed from the skull base to thigh after the radiotracer. CT data was obtained and used for  attenuation correction and anatomic localization.  FASTING BLOOD GLUCOSE:  Value: 91 mg/dl  COMPARISON:  None  FINDINGS: NECK  No hypermetabolic lymph nodes in the neck.  CHEST  Atherosclerotic disease involves the thoracic aorta as well as the RCA, LAD and left circumflex coronary arteries. No hypermetabolic mediastinal or hilar lymph nodes identified.  No pleural effusion. Right upper lobe cavitary lesion measures 3.3 cm and has an SUV max equal to 19. No additional pulmonary nodules or mass is identified.  ABDOMEN/PELVIS  No abnormal hypermetabolic activity within the liver, pancreas, adrenal glands, or spleen. Atherosclerotic disease involves the abdominal aorta. The aorta measures 3 cm in AP dimension. There is a filter within the IVC. No hypermetabolic lymph nodes in the abdomen or pelvis. Nonspecific increased uptake is identified along the posterior aspect of the prostate gland.  SKELETON  No focal hypermetabolic activity to suggest skeletal metastasis.  IMPRESSION: 1. Right upper lobe, cavitary lesion exhibits intense FDG uptake. Assuming non-small cell lung cancer this would be consistent with a T2aN0M0 lesion or stage IB tumor. An infectious or inflammatory process may also exhibit intense FDG uptake. Correlation with biopsy is recommended. 2.  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 Recommend follow up by [place] in 3years. This recommendation follows ACR consensus guidelines: White Paper of the ACR Incidental Findings Committee II on Vascular Findings. Joellyn Rued [id number]; 22:979-892. 3. Nonspecific increased uptake within the posterior aspect of the prostate gland may reflect prostatitis. PET-CT is generally not used to assess for prostate cancer. Consider correlation with patient's PSA.   Electronically Signed   By: Kerby Moors M.D.   On: 09/01/2014 09:14    Labs:  CBC: No results for input(s): WBC, HGB, HCT, PLT in the last 8760 hours.  COAGS:  Recent Labs  08/12/14 0926 08/19/14 0918 08/31/14 0847 09/12/14 0816  INR 5.6 2.1 2.9 1.9    BMP: No results for input(s): NA, K, CL, CO2, GLUCOSE, BUN, CALCIUM, CREATININE, GFRNONAA, GFRAA in the last 8760 hours.  Invalid input(s): CMP  LIVER FUNCTION TESTS: No results for input(s): BILITOT, AST, ALT, ALKPHOS, PROT, ALBUMIN in the last 8760 hours.  TUMOR MARKERS: No results for input(s): AFPTM, CEA, CA199, CHROMGRNA in the last 8760 hours.  Assessment and Plan:  SOB; bloody sputum CT shows Rt lung mass; +PET Now scheduled for bx Pt aware of procedure benefits and risks and agreeable to proceed Consent signed andin chart (off coumadin x 3d; did take dose this am) (LD Lovenox inj 1/6  8am)  Thank you for this interesting consult.  I greatly enjoyed meeting Raymond Chaney and look forward to participating in their care.    I spent a total of 20 minutes face to face in clinical consultation, greater than 50% of which was counseling/coordinating care for Rt lung mass bx  Signed: Cherylee Rawlinson A 09/15/2014, 10:07 AM

## 2014-09-15 NOTE — Discharge Instructions (Signed)
Needle Biopsy of Lung, Care After °Refer to this sheet in the next few weeks. These instructions provide you with information on caring for yourself after your procedure. Your health care provider may also give you more specific instructions. Your treatment has been planned according to current medical practices, but problems sometimes occur. Call your health care provider if you have any problems or questions after your procedure. °WHAT TO EXPECT AFTER THE PROCEDURE °· A bandage will be applied over the area where the needle was inserted. You may be asked to apply pressure to the bandage for several minutes to ensure there is minimal bleeding. °· In most cases, you can leave when your needle biopsy procedure is completed. Do not drive yourself home. Someone else should take you home. °· If you received an IV sedative or general anesthetic, you will be taken to a comfortable place to relax while the medicine wears off. °· If you have upcoming travel scheduled, talk to your health care provider about when it is safe to travel by air after the procedure. °HOME CARE INSTRUCTIONS °· Expect to take it easy for the rest of the day. °· Protect the area where you received the needle biopsy by keeping the bandage in place for as long as instructed. °· You may feel some mild pain or discomfort in the area, but this should stop in a day or two. °· Take medicines only as directed by your health care provider. °SEEK MEDICAL CARE IF:  °· You have pain at the biopsy site that worsens or is not helped by medicine. °· You have swelling or drainage at the needle biopsy site. °· You have a fever. °SEEK IMMEDIATE MEDICAL CARE IF:  °· You have new or worsening shortness of breath. °· You have chest pain. °· You are coughing up blood. °· You have bleeding that does not stop with pressure or a bandage. °· You develop light-headedness or fainting. °Document Released: 06/23/2007 Document Revised: 01/10/2014 Document Reviewed:  01/18/2013 °ExitCare® Patient Information ©2015 [employer]. This information is not intended to replace advice given to you by your health care provider. Make sure you discuss any questions you have with your health care provider. ° °

## 2014-09-15 NOTE — Procedures (Signed)
Technically successful CT guided biopsy of hypermetabolic nodule in right upper lobe.  No immediate post procedural complications.

## 2014-09-20 ENCOUNTER — Ambulatory Visit: Payer: Medicare Other | Admitting: Surgery

## 2014-09-21 ENCOUNTER — Ambulatory Visit (INDEPENDENT_AMBULATORY_CARE_PROVIDER_SITE_OTHER): Payer: Medicare Other | Admitting: Surgery

## 2014-09-21 ENCOUNTER — Encounter: Payer: Self-pay | Admitting: Surgery

## 2014-09-21 ENCOUNTER — Other Ambulatory Visit: Payer: Self-pay | Admitting: *Deleted

## 2014-09-21 ENCOUNTER — Ambulatory Visit (INDEPENDENT_AMBULATORY_CARE_PROVIDER_SITE_OTHER): Payer: Medicare Other | Admitting: Pharmacist Clinician (PhC)/ Clinical Pharmacy Specialist

## 2014-09-21 ENCOUNTER — Other Ambulatory Visit (HOSPITAL_COMMUNITY): Payer: Self-pay | Admitting: Cardiovascular Disease

## 2014-09-21 VITALS — BP 116/71 | HR 80 | Resp 20 | Ht 69.0 in | Wt 250.0 lb

## 2014-09-21 DIAGNOSIS — C3411 Malignant neoplasm of upper lobe, right bronchus or lung: Secondary | ICD-10-CM

## 2014-09-21 DIAGNOSIS — Z7901 Long term (current) use of anticoagulants: Secondary | ICD-10-CM

## 2014-09-21 DIAGNOSIS — Z9889 Other specified postprocedural states: Secondary | ICD-10-CM

## 2014-09-21 DIAGNOSIS — C3490 Malignant neoplasm of unspecified part of unspecified bronchus or lung: Secondary | ICD-10-CM

## 2014-09-21 DIAGNOSIS — I739 Peripheral vascular disease, unspecified: Secondary | ICD-10-CM

## 2014-09-21 DIAGNOSIS — I4891 Unspecified atrial fibrillation: Secondary | ICD-10-CM

## 2014-09-21 LAB — POCT INR: INR: 2.3

## 2014-09-21 NOTE — Progress Notes (Signed)
     HPI:  Raymond Chaney returns today to discuss the results of his recent  CT-guided needle biopsy of the right upper lobe lung mass. This showed poorly differentiated adenocarcinoma. He is 81 years old with multiple medical problems and morbid obesity and is not a candidate for a right upper lobectomy.   Current Outpatient Prescriptions  Medication Sig Dispense Refill  . allopurinol (ZYLOPRIM) 100 MG tablet Take 100 mg by mouth daily.    . Ascorbic Acid (VITAMIN C) 1000 MG tablet Take 1,000 mg by mouth daily.    Marland Kitchen doxazosin (CARDURA) 4 MG tablet Take 4 mg by mouth daily.    . fluticasone (FLONASE) 50 MCG/ACT nasal spray Place 2 sprays into the nose daily.   0  . lisinopril (PRINIVIL,ZESTRIL) 10 MG tablet Take 10 mg by mouth daily.    . metFORMIN (GLUCOPHAGE) 1000 MG tablet Take 1,000 mg by mouth daily with breakfast.    . metoprolol tartrate (LOPRESSOR) 50 MG tablet Take 1 tablet (50 mg total) by mouth daily. 60 tablet 0  . mirabegron ER (MYRBETRIQ) 50 MG TB24 tablet Take 50 mg by mouth daily.    . pravastatin (PRAVACHOL) 80 MG tablet Take 80 mg by mouth daily.    Marland Kitchen warfarin (COUMADIN) 5 MG tablet Take 1 tablet by mouth daily or as directed (Patient taking differently: Take 2.5-5 mg by mouth daily. Take 1/2 tablet (2.5 mg) on Monday, Wednesday, Friday, take 1 tablet (5 mg) on Sunday, Tuesday, Thursday, Saturday) 90 tablet 3   No current facility-administered medications for this visit.     Physical Exam: BP 116/71 mmHg  Pulse 80  Resp 20  Ht 5\' 9"  (1.753 m)  Wt 250 lb (113.399 kg)  BMI 36.90 kg/m2  SpO2 97% He looks comfortable Lungs are clear   Impression:  He has clinical stage T2aN0M0 poorly differentiated adenocarcinoma of the lung and is not a surgical candidate. I will order an MRI of the brain and have him return to [facility] to see medical oncology and radiation oncology.  Plan:  Referral to [facility] clinic.

## 2014-09-22 ENCOUNTER — Telehealth: Payer: Self-pay | Admitting: *Deleted

## 2014-09-22 NOTE — Telephone Encounter (Signed)
Called and spoke with wife.  I have her appt for thoracic clinic to see Rad Onc on 09/29/14.  She verbalized understanding of appt time and place.

## 2014-09-28 ENCOUNTER — Ambulatory Visit: Payer: Medicare Other | Admitting: Pharmacist Clinician (PhC)/ Clinical Pharmacy Specialist

## 2014-09-28 ENCOUNTER — Other Ambulatory Visit: Payer: Self-pay | Admitting: *Deleted

## 2014-09-28 DIAGNOSIS — C3411 Malignant neoplasm of upper lobe, right bronchus or lung: Secondary | ICD-10-CM

## 2014-09-28 LAB — BUN: BUN: 12 mg/dL (ref 6–23)

## 2014-09-28 LAB — CREATININE, SERUM: Creat: 0.82 mg/dL (ref 0.50–1.35)

## 2014-09-29 ENCOUNTER — Encounter: Payer: Self-pay | Admitting: *Deleted

## 2014-09-29 ENCOUNTER — Ambulatory Visit: Payer: Medicare Other | Attending: Radiation Oncology | Admitting: Physical Therapy

## 2014-09-29 ENCOUNTER — Ambulatory Visit
Admission: RE | Admit: 2014-09-29 | Discharge: 2014-09-29 | Disposition: A | Discharge status: Home / Self Care | Payer: Medicare Other | Source: Ambulatory Visit | Attending: Radiation Oncology | Admitting: Radiation Oncology

## 2014-09-29 VITALS — BP 135/61 | HR 85 | Temp 98.1°F | Resp 19 | Wt 258.6 lb

## 2014-09-29 DIAGNOSIS — M2560 Stiffness of unspecified joint, not elsewhere classified: Secondary | ICD-10-CM | POA: Insufficient documentation

## 2014-09-29 DIAGNOSIS — M2569 Stiffness of other specified joint, not elsewhere classified: Secondary | ICD-10-CM

## 2014-09-29 DIAGNOSIS — C3411 Malignant neoplasm of upper lobe, right bronchus or lung: Secondary | ICD-10-CM

## 2014-09-29 DIAGNOSIS — R0602 Shortness of breath: Secondary | ICD-10-CM | POA: Insufficient documentation

## 2014-09-29 NOTE — Progress Notes (Signed)
[facility] Clinical Social Work  Clinical Social Work met with patient/family t [facility] appointment to offer support and assess for psychosocial needs.  [name] reports "feeling good" about the plan, patient reports "what other choice do I have to just deal with it?".  [name] was accompanied by his spouse and she has no concerns at this time.    Clinical Social Work briefly discussed Clinical Social Work role and [facility] support programs/services.  Clinical Social Work encouraged patient to call with any additional questions or concerns.   Lauren Mullis, MSW, LCSW, OSW-C Clinical Social Worker [facility] [phone]  

## 2014-09-29 NOTE — Therapy (Signed)
[place], [place], [address] Phone: [phone]   Fax:  [phone]  Physical Therapy Evaluation  Patient Details  Name: Raymond Chaney MRN: [id number] Date of Birth: [birth year] Referring Provider:  Curt Bears, MD  Encounter Date: 09/29/2014      PT End of Session - 09/29/14 1720    Visit Number 1   Number of Visits 1   PT Start Time 2355   PT Stop Time 1700   PT Time Calculation (min) 15 min      Past Medical History  Diagnosis Date  . Arthritis   . CHF (congestive heart failure)   . COPD (chronic obstructive pulmonary disease)   . Diabetes mellitus without complication   . Hypertension   . Stroke   . Gout   . Alcohol abuse   . GIB (gastrointestinal bleeding)   . Complication of anesthesia     2004   stopped breathing  3 times  on the table "  . Shortness of breath   . Sleep apnea 09/08/2012    sleep study 08/20/12-ild sleep apnea with an AHI of 7.8/hr and durimg REM 27.2/hr: CPAP 10/04/12-tried nasal pillows and face mask, but couldnt tolerate  . Atrial fibrillation     chronic on coumadin; monitor 05/20/11- AFib    . DVT, lower extremity      2005  . Hyperlipidemia   . CAD (coronary artery disease)     myoview 10/19/09-diaphragmatic attenuation vs inferior scar without ischemia; echo 09/11/12-Ef 40-45%, L atrium mod to severely dilated    Past Surgical History  Procedure Laterality Date  . Total knee arthroplasty    . Colon surgery      colon polyps  . Hip surgery    . 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 Cardiac catheterization  10/11/02    noncritical CAD, EF 45-50%  . Cardiac catheterization  08/22/99    no significant CAD, nl EF    There were no vitals taken for this visit.  Visit Diagnosis:  Shortness of breath - Plan: PT plan of care cert/re-cert  Back stiffness - Plan: PT plan of care  cert/re-cert      Subjective Assessment - 09/29/14 1710    Symptoms Pt. with worsening shortness of breath with exertion as well as blood-tinged sputum 07/2014.   Pertinent History Diagnosed with right upper lobe poorly differentiated adenocarcinoma.  Ex-smoker who quit 1985.  Atrial fibrillation and on coumadin; CAD s/p catheterization 200, 2004, 2008.  Expected to have radiation therapy. h/o left TKA; arthritis in right knee.  Uses some type of breathing machine (not CPAP, not O2) at night.                                                Currently in Pain? No/denies          [facility] PT Assessment - 09/29/14 0001    Assessment   Medical Diagnosis right upper lobe adenocarcinoma   Precautions   Precautions Other (comment)   Precaution Comments cancer precautions   Restrictions   Weight Bearing Restrictions No   Balance Screen   Has the patient fallen in the past 6 months No   Has the patient had a decrease in activity level because of a  fear of falling?  No   Is the patient reluctant to leave their home because of a fear of falling?  No   Home Environment   Living Enviornment Private residence   Living Arrangements Spouse/significant other   Type of [place] Access Level entry   [place] One level   Prior Function   Level of [place] Independent with basic ADLs;Independent with gait  limited distance   Vocation Part time employment   Vocation Requirements drives cars for [employer] Not tested  denies numbness; does have burning if on feet a lot   Posture/Postural Control   Posture/Postural Control Postural limitations   Postural Limitations Forward head   AROM   Overall AROM  Deficits   Overall AROM Comments Mild limitations in trunk AROM, even for his age.   Strength   Overall Strength Other (comment)  not tested, but functional   Balance   Balance Assessed Yes   Dynamic Standing Balance   Dynamic Standing - Comments reaches  12 inches forward in standing                          PT Education - 10/21/2014 1720    Education provided Yes   Education Details posture, breathing, walking, energy conservation   Person(s) Educated Patient;Spouse   Methods Explanation;Handout   Comprehension Verbalized understanding               Lung Clinic Goals - Oct 21, 2014 1723    Patient will be able to verbalize understanding of the benefit of exercise to decrease fatigue.   Status Achieved   Patient will be able to verbalize the importance of posture.   Status Achieved   Patient will be able to demonstrate diaphragmatic breathing for improved lung function.   Status Achieved   Patient will be able to verbalize understanding of the role of physical therapy to prevent functional decline and who to contact if physical therapy is needed.   Status Achieved             Plan - 10-21-2014 1720    Clinical Impression Statement Patient with h/o shortness of breath and musculoskeletal issues may benefit from therapy going forward for endurance and improved mobility.   Rehab Potential Fair   PT Frequency One time visit   PT Treatment/Interventions Patient/family education   PT Next Visit Plan None at this time.   Consulted and Agree with Plan of Care Patient          G-Codes - 10/21/2014 1723    Functional Assessment Tool Used clinical judgement   Functional Limitation Mobility: Walking and moving around   Mobility: Walking and Moving Around Current Status [phone]) At least 40 percent but less than 60 percent impaired, limited or restricted   Mobility: Walking and Moving Around Goal Status [phone]) At least 40 percent but less than 60 percent impaired, limited or restricted   Mobility: Walking and Moving Around Discharge Status [phone]) At least 40 percent but less than 60 percent impaired, limited or restricted       Problem List Patient Active Problem List   Diagnosis Date Noted  . Cancer of upper  lobe of right lung 10/21/2014  . Essential hypertension 10/15/2013  . Obstructive sleep apnea 10/15/2013  . Morbid obesity 10/15/2013  . Mixed hyperlipidemia with apolipoprotein E4 variant 10/15/2013  . Hyperlipidemia 10/15/2013  . SOB (shortness of breath), more uncomfortable  02/09/2013  . 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 Long term (current) use of anticoagulants 11/26/2012  . Epididymitis 09/08/2012  . UTI (lower urinary tract infection) 09/08/2012  . Atypical chest pain 09/08/2012  . Atrial fibrillation 09/08/2012  . Diabetes mellitus type 2 in obese 09/08/2012    SALISBURY,DONNA 09/29/2014, 5:27 PM  [place], [place], [address] Phone: [phone]   Fax:  [phone]  Serafina Royals, [place]

## 2014-09-29 NOTE — Progress Notes (Signed)
Radiation Oncology         [phone]) [phone] ________________________________  Initial outpatient Consultation  Name: BREKKEN BEACH MRN: [id number]  Date: 09/29/2014  DOB: [birth year]  VO:[id number] R, MD  Cyndia Bent Fernande Boyden, MD   REFERRING PHYSICIAN: Gaye Pollack, MD  DIAGNOSIS: Clinical stage T2aN0M0 poorly differentiated adenocarcinoma of the lung   HISTORY OF PRESENT ILLNESS::Raymond Chaney is a 81 y.o. male who is seen out courtesy of Dr Cyndia Bent for an opinion concerning radiation therapy as part of management of patient's recently diagnosed non-small cell lung cancer.  the patient is seen as part of the multidisciplinary thoracic oncology clinic. He is an elderly gentleman with a long smoking history who quit 20 years ago but has significant COPD and DOE. He also has diabetes, hypertension, prior stroke, morbid obesity, hyperlipidemia, chronic atrial fibrillation on coumadin, moderate coronary disease by cath in 2008.He reports about a year history of worsening shortness of breath with exertion. In November 2015 he began having blood-tinged sputum and this has persisted. He had a CXR showing a RUL density and CTscan showed a 2.6 x 3.3 cm cavitary mass in the RUL laterally with a slight pulmonary parenchymal haziness around the lesion. A PET scan showed hypermetabolic uptake in the lesion with an SUV max of 19. There were no other areas of significant uptake. . The patient proceeded to undergo biopsy of his lung lesion which revealed poorly differentiated adenocarcinoma. Patient was evaluated by Dr. Cyndia Bent and given the patient's multiple medical problems he was not felt to be a candidate for surgery. Patient was scheduled for his MRI the brain  January 22 however this is been rescheduled to early February in light of the upcoming snowstorm.  Radiation therapy is been consulted for consideration for definitive treatment.  PREVIOUS RADIATION THERAPY: No  PAST MEDICAL HISTORY:  has a  past medical history of Arthritis; CHF (congestive heart failure); COPD (chronic obstructive pulmonary disease); Diabetes mellitus without complication; Hypertension; Stroke; Gout; Alcohol abuse; GIB (gastrointestinal bleeding); Complication of anesthesia; Shortness of breath; Sleep apnea (09/08/2012); Atrial fibrillation; DVT, lower extremity; Hyperlipidemia; and CAD (coronary artery disease).    PAST SURGICAL HISTORY: Past Surgical History  Procedure Laterality Date  . Total knee arthroplasty    . Colon surgery      colon polyps  . Hip surgery    . 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 Cardiac catheterization  10/11/02    noncritical CAD, EF 45-50%  . Cardiac catheterization  08/22/99    no significant CAD, nl EF    FAMILY HISTORY: family history includes Cancer in his brother, brother, brother, sister, and sister; Dementia in his mother; Diabetes in his father; Heart attack in his father; Heart disease in his brother.  SOCIAL HISTORY:  reports that he quit smoking about 30 years ago. He has quit using smokeless tobacco. He reports that he does not drink alcohol or use illicit drugs.  ALLERGIES: Penicillins  MEDICATIONS:  Current Outpatient Prescriptions  Medication Sig Dispense Refill  . allopurinol (ZYLOPRIM) 100 MG tablet Take 100 mg by mouth daily.    . Ascorbic Acid (VITAMIN C) 1000 MG tablet Take 1,000 mg by mouth daily.    Marland Kitchen doxazosin (CARDURA) 4 MG tablet Take 4 mg by mouth daily.    . fluticasone (FLONASE) 50 MCG/ACT nasal spray Place 2 sprays into the nose daily.   0  . lisinopril (PRINIVIL,ZESTRIL) 10 MG tablet  Take 10 mg by mouth daily.    . metFORMIN (GLUCOPHAGE) 1000 MG tablet Take 1,000 mg by mouth daily with breakfast.    . metoprolol tartrate (LOPRESSOR) 50 MG tablet Take 1 tablet (50 mg total) by mouth daily. 60 tablet 0  . mirabegron ER (MYRBETRIQ) 50 MG TB24 tablet Take 50 mg by mouth daily.    .  pravastatin (PRAVACHOL) 80 MG tablet Take 80 mg by mouth daily.    Marland Kitchen warfarin (COUMADIN) 5 MG tablet Take 1 tablet by mouth daily or as directed (Patient taking differently: Take 2.5-5 mg by mouth daily. Take 1/2 tablet (2.5 mg) on Monday, Wednesday, Friday, take 1 tablet (5 mg) on Sunday, Tuesday, Thursday, Saturday) 90 tablet 3   No current facility-administered medications for this encounter.    REVIEW OF SYSTEMS:  A 15 point review of systems is documented in the electronic medical record. This was obtained by the nursing staff. However, I reviewed this with the patient to discuss relevant findings and make appropriate changes.  Dyspnea with exertion. Patient has minimal blood-tinged sputum at this time. He denies any pain within the chest area. Patient denies any new bony pain headaches dizziness or blurred vision. He does admit to pain in the right knee. He has had a left knee reconstruction but also has pain in this area in addition.   PHYSICAL EXAM:  weight is 258 lb 9.6 oz (117.3 kg). His temperature is 98.1 F (36.7 C). His blood pressure is 135/61 and his pulse is 85. His respiration is 19 and oxygen saturation is 95%.   BP 135/61 mmHg  Pulse 85  Temp(Src) 98.1 F (36.7 C)  Resp 19  Wt 258 lb 9.6 oz (117.3 kg)  SpO2 95%  General Appearance:    Alert, cooperative, no distress, appears stated age, accompanied by wife on evaluation today   Head:    Normocephalic, without obvious abnormality, atraumatic  Eyes:    PERRL, conjunctiva/corneas clear, EOM's intact,           Ears:    hearing aid present along the left side   Nose:   Nares normal, septum midline, mucosa normal, no drainage    or sinus tenderness  Throat:   Lips, mucosa, and tongue normal; dentures in place gums normal  Neck:   Supple, symmetrical, trachea midline, no adenopathy;       thyroid:  No enlargement/tenderness/nodules; no carotid   bruit or JVD  Back:     Symmetric, no curvature, ROM normal, no CVA tenderness    Lungs:     Clear to auscultation bilaterally, respirations unlabored  Chest wall:    No tenderness or deformity  Heart:    Regular rate and rhythm, S1 and S2 normal, no murmur, rub   or gallop  Abdomen:     Soft, non-tender,obese,  bowel sounds active all four quadrants,    no masses, no organomegaly        Extremities:   Extremities normal, atraumatic, no cyanosis or edema  Pulses:   2+ and symmetric all extremities  Skin:   Skin color, texture, turgor normal, no rashes or lesions  Lymph nodes:   Cervical, supraclavicular, and axillary nodes normal  Neurologic:   Normal strength, sensation and reflexes      throughout     ECOG = 2   2 - Symptomatic, <50% in bed during the day (Ambulatory and capable of all self care but unable to carry out any work activities. Up and  about more than 50% of waking hours)   LABORATORY DATA:  Lab Results  Component Value Date   WBC 5.2 09/15/2014   HGB 12.2* 09/15/2014   HCT 37.6* 09/15/2014   MCV 96.9 09/15/2014   PLT 135* 09/15/2014   NEUTROABS 5.1 09/10/2013   Lab Results  Component Value Date   NA 146 09/10/2013   K 4.1 09/10/2013   CL 108 09/10/2013   CO2 26 09/10/2013   GLUCOSE 138* 09/10/2013   CREATININE 0.82 09/28/2014   CALCIUM 8.9 09/10/2013      RADIOGRAPHY: Dg Chest 1 View  09/15/2014   CLINICAL DATA:  Post biopsy  EXAM: CHEST - 1 VIEW  COMPARISON:  CT chest of 09/15/2014 and chest x-ray of 09/10/2013  FINDINGS: The cavitary lesion within the right upper lobe appears thick-walled. In view of the rapid change in size compared to the chest x-ray of 09/10/2013 an inflammatory or infectious process would be favored. No pneumothorax is seen post biopsy. No effusion is noted. The left lung is clear. Heart size is stable.  IMPRESSION: Cavitary lesion in the right upper lobe. No pneumothorax post biopsy.   Electronically Signed   By: Ivar Drape M.D.   On: 09/15/2014 13:49   Nm Pet Image Initial (pi) Skull Base To  Thigh  09/01/2014   CLINICAL DATA:  Initial treatment strategy for pulmonary nodule.  EXAM: NUCLEAR MEDICINE PET SKULL BASE TO THIGH  TECHNIQUE: 13.2 mCi F-18 FDG was injected intravenously. Full-ring PET imaging was performed from the skull base to thigh after the radiotracer. CT data was obtained and used for attenuation correction and anatomic localization.  FASTING BLOOD GLUCOSE:  Value: 91 mg/dl  COMPARISON:  None  FINDINGS: NECK  No hypermetabolic lymph nodes in the neck.  CHEST  Atherosclerotic disease involves the thoracic aorta as well as the RCA, LAD and left circumflex coronary arteries. No hypermetabolic mediastinal or hilar lymph nodes identified.  No pleural effusion. Right upper lobe cavitary lesion measures 3.3 cm and has an SUV max equal to 19. No additional pulmonary nodules or mass is identified.  ABDOMEN/PELVIS  No abnormal hypermetabolic activity within the liver, pancreas, adrenal glands, or spleen. Atherosclerotic disease involves the abdominal aorta. The aorta measures 3 cm in AP dimension. There is a filter within the IVC. No hypermetabolic lymph nodes in the abdomen or pelvis. Nonspecific increased uptake is identified along the posterior aspect of the prostate gland.  SKELETON  No focal hypermetabolic activity to suggest skeletal metastasis.  IMPRESSION: 1. Right upper lobe, cavitary lesion exhibits intense FDG uptake. Assuming non-small cell lung cancer this would be consistent with a T2aN0M0 lesion or stage IB tumor. An infectious or inflammatory process may also exhibit intense FDG uptake. Correlation with biopsy is recommended. 2. 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 Recommend follow up by [place] in 3years. This recommendation follows ACR consensus guidelines: White Paper of the ACR Incidental Findings Committee II on Vascular Findings. Joellyn Rued [id number]; 09:811-914. 3. Nonspecific increased  uptake within the posterior aspect of the prostate gland may reflect prostatitis. PET-CT is generally not used to assess for prostate cancer. Consider correlation with patient's PSA.   Electronically Signed   By: Kerby Moors M.D.   On: 09/01/2014 09:14   Ct Biopsy  09/15/2014   INDICATION: Indeterminate centrally necrotic hypermetabolic nodule within the right upper lobe worrisome for either bronchogenic carcinoma or atypical infection. Please perform CT-guided biopsy for tissue  diagnostic purposes.  EXAM: CT GUIDED BIOPSY OF HYPERMETABOLIC CENTRALLY CAVITARY MASS WITHIN THE RIGHT UPPER LOBE  COMPARISON:  Chest CT- 08/25/2014; PET-CT - 09/01/2014  MEDICATIONS: Fentanyl 25 mcg IV; Versed 0.5 mg IV  ANESTHESIA/SEDATION: Sedation time  12 minutes  CONTRAST:  None  COMPLICATIONS: None immediate.  PROCEDURE: Informed consent was obtained from the patient following an explanation of the procedure, risks, benefits and alternatives. The patient understands,agrees and consents for the procedure. All questions were addressed. A time out was performed prior to the initiation of the procedure.  The patient was positioned supine on the CT table and a limited chest CT was performed for procedural planning demonstrating unchanged appearance of the a centrally cavitary approximately 2.9 x 2.9 cm subpleural nodule within the right upper lobe (image 14, series 2). The operative site was prepped and draped in the usual sterile fashion. Under sterile conditions and local anesthesia, a 17 gauge coaxial needle was advanced into the peripheral aspect of the nodule. Positioning was confirmed with intermittent CT fluoroscopy and followed by the acquisition of 3 core needle biopsies with an 18 gauge core needle biopsy device. Two core needle biopsies were placed in formalin and sent for surgical pathologic analysis wall a separate core needle biopsy sample was placed in saline and sent for fungal culture.  Limited post procedural chest  CT was negative for pneumothorax or additional complication. The co-axial needle was removed and hemostasis was achieved with manual compression. A dressing was placed. The patient tolerated the procedure well without immediate postprocedural complication. The patient was escorted to have an upright chest radiograph.  IMPRESSION: Technically successful CT guided core needle core biopsy of centrally cavitary nodule within the subpleural aspect of the right upper lobe. Acquired samples were sent for both surgical pathologic analysis as well as fungal culture.   Electronically Signed   By: Sandi Mariscal M.D.   On: 09/15/2014 17:37    IMPRESSION: Clinical stage T2aN0M0 poorly differentiated adenocarcinoma of the lung. The patient is not a candidate for surgery despite his early stage in light of his multiple medical problems. Patient would be a good candidate for a definitive course of radiation therapy. Based on the location and size of this lesion the patient may be a potential candidate for SBRT or at least hypo-fractionated radiation therapy.  I discussed the treatment course side effects and potential toxicities of radiation therapy in this situation with the patient and his wife. The patient appears to understand and wishes to proceed with planned course of radiation treatment.  PLAN: Simulation and planning next week with full-body immobilization including " 4D imaging"   I spent 60 minutes minutes face to face with the patient and more than 50% of that time was spent in counseling and/or coordination of care.   ------------------------------------------------  Blair Promise, PhD, MD

## 2014-09-30 ENCOUNTER — Inpatient Hospital Stay: Admission: RE | Admit: 2014-09-30 | Payer: Medicare Other | Source: Ambulatory Visit

## 2014-09-30 ENCOUNTER — Other Ambulatory Visit (HOSPITAL_COMMUNITY)
Admission: RE | Admit: 2014-09-30 | Discharge: 2014-09-30 | Disposition: A | Discharge status: Home / Self Care | Payer: Medicare Other | Source: Ambulatory Visit | Attending: Internal Medicine | Admitting: Internal Medicine

## 2014-09-30 ENCOUNTER — Encounter: Payer: Self-pay | Admitting: *Deleted

## 2014-09-30 DIAGNOSIS — C3411 Malignant neoplasm of upper lobe, right bronchus or lung: Secondary | ICD-10-CM | POA: Insufficient documentation

## 2014-09-30 NOTE — [facility] Oncology Navigator Note (Unsigned)
   Thoracic Treatment Summary Name:Raymond Chaney Date:09/30/2014 DOB:1932-08-20 Your Medical Team  Radiation Oncologist:Dr. Kinard Surgeon:Dr. Bartle Type and Stage of Lung Cancer Non-Small Cell Carcinoma: Adenocarcinoma   Clinical Stage:  Cancer of upper lobe of right lung   Staging form: Lung, AJCC 7th Edition     Clinical: Stage IB (T2a, N0, M0) - Unsigned    Clinical stage is based on radiology exams.  Pathological stage will be determined after surgery.  Staging is based on the size of the tumor, involvement of lymph nodes or not, and whether or not the cancer center has spread. Recommendations Recommendations: Radiation therapy   These recommendations are based on information available as of today's consult.  This is subject to change depending further testing or exams. Next Steps Next Step: Radiation Oncology will set up follow up appointments Barriers to Care What do you perceive as a potential barrier that may prevent you from receiving your treatment plan? Education information given and explained Support information given and explained    Resources Given: [place] [facility] [employer] at [email or web address] [phone] What to expect at [facility] information   Questions Norton Blizzard, RN BSN Thoracic Oncology Nurse Navigator at [place] is a nurse navigator that is available to assist you through your cancer journey.  She can answer your questions and/or provide resources regarding your treatment plan, emotional support, or financial concerns.

## 2014-10-06 ENCOUNTER — Ambulatory Visit
Admission: RE | Admit: 2014-10-06 | Discharge: 2014-10-06 | Disposition: A | Discharge status: Home / Self Care | Payer: Medicare Other | Source: Ambulatory Visit | Attending: Radiation Oncology | Admitting: Radiation Oncology

## 2014-10-06 ENCOUNTER — Telehealth: Payer: Self-pay | Admitting: Medical Oncology

## 2014-10-06 DIAGNOSIS — C3411 Malignant neoplasm of upper lobe, right bronchus or lung: Secondary | ICD-10-CM | POA: Insufficient documentation

## 2014-10-06 DIAGNOSIS — Z87891 Personal history of nicotine dependence: Secondary | ICD-10-CM | POA: Insufficient documentation

## 2014-10-06 NOTE — Progress Notes (Signed)
  Radiation Oncology         [phone] ________________________________  Name: COMMODORE BELLEW MRN: [id number]  Date: 10/06/2014  DOB: [birth year]  SIMULATION AND TREATMENT PLANNING NOTE    ICD-9-CM ICD-10-CM   1. Cancer of upper lobe of right lung 162.3 C34.11     DIAGNOSIS: Clinical stage T2aN0M0 poorly differentiated adenocarcinoma of the lung   NARRATIVE:  The patient was brought to the [place].  Identity was confirmed.  All relevant records and images related to the planned course of therapy were reviewed.  The patient freely provided informed written consent to proceed with treatment after reviewing the details related to the planned course of therapy. The consent form was witnessed and verified by the simulation staff.  Then, the patient was set-up in a stable reproducible  supine position for radiation therapy.  CT images were obtained.  Surface markings were placed.  The CT images were loaded into the planning software.  Then the target and avoidance structures were contoured.  Treatment planning then occurred.  The radiation prescription was entered and confirmed.  Then, I designed and supervised the construction of a total of 2 medically necessary complex treatment devices.  I have requested : This treatment constitutes a Special Treatment Procedure for the following reason: [ High dose per fraction requiring special monitoring for increased toxicities of treatment including daily imaging..  I have ordered:dose calc.  PLAN:  The patient will receive 54 Gy in 3 fractions or 60 Gy in 5 fractions depending on chest wall dose constraints. Stereotactic body radiation therapy techniques ( SBRT) will be used to deliver the patient's treatment.  ________________________________  -----------------------------------  Blair Promise, PhD, MD

## 2014-10-06 NOTE — Telephone Encounter (Signed)
Need alternate tissue sample for molecular test. They have talked to our path dept. staff

## 2014-10-06 NOTE — Progress Notes (Signed)
  Radiation Oncology         [phone] ________________________________  Name: Raymond Chaney MRN: [id number]  Date: 10/06/2014  DOB: [birth year]  RESPIRATORY MOTION MANAGEMENT SIMULATION  NARRATIVE:  In order to account for effect of respiratory motion on target structures and other organs in the planning and delivery of radiotherapy, this patient underwent respiratory motion management simulation.  To accomplish this, when the patient was brought to the CT simulation planning suite, 4D respiratoy motion management CT images were obtained.  The CT images were loaded into the planning software.  Then, using a variety of tools including Cine, MIP, and standard views, the target volume and planning target volumes (PTV) were delineated.  Avoidance structures were contoured.  Treatment planning then occurred.  Dose volume histograms were generated and reviewed for each of the requested structure.  The resulting plan was carefully reviewed and approved today.  -----------------------------------  Blair Promise, PhD, MD

## 2014-10-07 ENCOUNTER — Ambulatory Visit: Payer: Medicare Other | Admitting: Physician Assistant

## 2014-10-10 ENCOUNTER — Ambulatory Visit
Admission: RE | Admit: 2014-10-10 | Discharge: 2014-10-10 | Disposition: A | Discharge status: Home / Self Care | Payer: Medicare Other | Source: Ambulatory Visit | Attending: Surgery | Admitting: Surgery

## 2014-10-10 DIAGNOSIS — C3411 Malignant neoplasm of upper lobe, right bronchus or lung: Secondary | ICD-10-CM

## 2014-10-10 MED ORDER — GADOBENATE DIMEGLUMINE 529 MG/ML IV SOLN
20.0000 mL | Freq: Once | INTRAVENOUS | Status: AC | PRN
  Administered 2014-10-10: 20 mL via INTRAVENOUS

## 2014-10-12 ENCOUNTER — Encounter (HOSPITAL_COMMUNITY): Payer: Self-pay

## 2014-10-12 DIAGNOSIS — C3411 Malignant neoplasm of upper lobe, right bronchus or lung: Secondary | ICD-10-CM | POA: Diagnosis not present

## 2014-10-13 DIAGNOSIS — C3411 Malignant neoplasm of upper lobe, right bronchus or lung: Secondary | ICD-10-CM | POA: Diagnosis not present

## 2014-10-13 LAB — FUNGUS CULTURE W SMEAR: Fungal Smear: NONE SEEN

## 2014-10-14 ENCOUNTER — Ambulatory Visit (HOSPITAL_COMMUNITY)
Admission: RE | Admit: 2014-10-14 | Discharge: 2014-10-14 | Disposition: A | Discharge status: Home / Self Care | Payer: Medicare Other | Source: Ambulatory Visit | Attending: Cardiology | Admitting: Cardiology

## 2014-10-14 ENCOUNTER — Ambulatory Visit (INDEPENDENT_AMBULATORY_CARE_PROVIDER_SITE_OTHER): Payer: Medicare Other | Admitting: Pharmacist Clinician (PhC)/ Clinical Pharmacy Specialist

## 2014-10-14 DIAGNOSIS — Z7901 Long term (current) use of anticoagulants: Secondary | ICD-10-CM

## 2014-10-14 DIAGNOSIS — I739 Peripheral vascular disease, unspecified: Secondary | ICD-10-CM

## 2014-10-14 DIAGNOSIS — I70213 Atherosclerosis of native arteries of extremities with intermittent claudication, bilateral legs: Secondary | ICD-10-CM | POA: Diagnosis not present

## 2014-10-14 DIAGNOSIS — I4891 Unspecified atrial fibrillation: Secondary | ICD-10-CM

## 2014-10-14 LAB — POCT INR: INR: 1.5

## 2014-10-14 NOTE — Progress Notes (Signed)
Arterial Lower Ext. Duplex Completed. Maura Braaten, BS, RDMS, RVT  

## 2014-10-18 ENCOUNTER — Ambulatory Visit
Admission: RE | Admit: 2014-10-18 | Discharge: 2014-10-18 | Disposition: A | Discharge status: Home / Self Care | Payer: Medicare Other | Source: Ambulatory Visit | Attending: Radiation Oncology | Admitting: Radiation Oncology

## 2014-10-18 VITALS — BP 154/64 | HR 86 | Temp 97.7°F | Resp 22 | Ht 69.0 in | Wt 262.1 lb

## 2014-10-18 DIAGNOSIS — C3411 Malignant neoplasm of upper lobe, right bronchus or lung: Secondary | ICD-10-CM

## 2014-10-18 NOTE — Progress Notes (Signed)
  Radiation Oncology         [phone] ________________________________  Name: Raymond Chaney MRN: [id number]  Date: 10/18/2014  DOB: Feb 17, 1932  Stereotactic Body Radiotherapy Treatment Procedure Note  DIAGNOSIS: Clinical stage T2aN0M0 poorly differentiated adenocarcinoma of the lung   Current Dose: 10 Gy Planned Dose: 50 Gy   NARRATIVE:  Raymond Chaney was brought to the stereotactic radiation treatment machine and placed supine on the CT couch. The patient was set up for stereotactic body radiotherapy on the body fix pillow.  3D TREATMENT PLANNING AND DOSIMETRY:  The patient's radiation plan was reviewed and approved prior to starting treatment.  It showed 3-dimensional radiation distributions overlaid onto the planning CT.  [facility] for the target structures as well as the organs at risk were reviewed. The documentation of this is filed in the radiation oncology EMR.  SIMULATION VERIFICATION:  The patient underwent CT imaging on the treatment unit.  These were carefully aligned to document that the ablative radiation dose would cover the target volume and maximally spare the nearby organs at risk according to the planned distribution.  SPECIAL TREATMENT PROCEDURE: Raymond Chaney received high dose ablative stereotactic body radiotherapy to the planned target volume without unforeseen complications. Treatment was delivered uneventfully. The high doses associated with stereotactic body radiotherapy and the significant potential risks require careful treatment set up and patient monitoring constituting a special treatment procedure   STEREOTACTIC TREATMENT MANAGEMENT:  Following delivery, the patient was evaluated clinically. The patient tolerated treatment without significant acute effects, and was discharged to home in stable condition.    PLAN: Continue treatment as planned.  ________________________________  Blair Promise, PhD, MD

## 2014-10-18 NOTE — Progress Notes (Addendum)
SBRT # 1 RUL , still coughs up bright red blood in mornings,about 1 teaspoon stated, brackish color later  In the day, , sob rr=22, , 96% room air sats, no nausea, appetite good,, patient HOH, energy level  Gets worn out easily,  Takes Oxygen 2 liters almost every night,  on Coumadin, last  INR =1.5 on 10/14/14,   Reviewed  Side effects mild fatigue, cough,   2:32 PM

## 2014-10-18 NOTE — Progress Notes (Signed)
  Radiation Oncology         [phone] ________________________________  Name: TION TSE MRN: [id number]  Date: 10/18/2014  DOB: 16-Nov-1931  Weekly Radiation Therapy Management  DIAGNOSIS: Clinical stage T2aN0M0 poorly differentiated adenocarcinoma of the lung   Current Dose: 10 Gy     Planned Dose:  50 Gy  Narrative . . . . . . . . The patient presents for routine under treatment assessment.                                   The patient is without complaint. He continues to have occasional blood-tinged sputum. He reports chronic fatigue.                                 Set-up films were reviewed.                                 The chart was checked. Physical Findings. . .  height is 5\' 9"  (1.753 m) and weight is 262 lb 1.6 oz (118.888 kg). His oral temperature is 97.7 F (36.5 C). His blood pressure is 154/64 and his pulse is 86. His respiration is 22 and oxygen saturation is 96%. . The lungs are clear. The heart has a regular rhythm and rate. Impression . . . . . . . The patient is tolerating radiation. Plan . . . . . . . . . . . . Continue treatment as planned.  ________________________________   Blair Promise, PhD, MD

## 2014-10-19 ENCOUNTER — Ambulatory Visit: Payer: Medicare Other | Admitting: Radiation Oncology

## 2014-10-20 ENCOUNTER — Ambulatory Visit
Admission: RE | Admit: 2014-10-20 | Discharge: 2014-10-20 | Disposition: A | Discharge status: Home / Self Care | Payer: Medicare Other | Source: Ambulatory Visit | Attending: Radiation Oncology | Admitting: Radiation Oncology

## 2014-10-20 DIAGNOSIS — C3411 Malignant neoplasm of upper lobe, right bronchus or lung: Secondary | ICD-10-CM | POA: Diagnosis not present

## 2014-10-20 NOTE — Progress Notes (Signed)
  Radiation Oncology         (336) [phone] ________________________________  Name: Raymond Chaney MRN: [id number]  Date: 10/20/2014  DOB: [birth year]  Stereotactic Body Radiotherapy Treatment Procedure Note  DIAGNOSIS: Clinical stage T2aN0M0 poorly differentiated adenocarcinoma of the lung   Current Dose: 20 Gy Planned Dose: 50 Gy   NARRATIVE:  Raymond Chaney was brought to the stereotactic radiation treatment machine and placed supine on the CT couch. The patient was set up for stereotactic body radiotherapy on the body fix pillow.  3D TREATMENT PLANNING AND DOSIMETRY:  The patient's radiation plan was reviewed and approved prior to starting treatment.  It showed 3-dimensional radiation distributions overlaid onto the planning CT.  [facility] for the target structures as well as the organs at risk were reviewed. The documentation of this is filed in the radiation oncology EMR.  SIMULATION VERIFICATION:  The patient underwent CT imaging on the treatment unit.  These were carefully aligned to document that the ablative radiation dose would cover the target volume and maximally spare the nearby organs at risk according to the planned distribution.  SPECIAL TREATMENT PROCEDURE: Raymond Chaney received high dose ablative stereotactic body radiotherapy to the planned target volume without unforeseen complications. Treatment was delivered uneventfully. The high doses associated with stereotactic body radiotherapy and the significant potential risks require careful treatment set up and patient monitoring constituting a special treatment procedure   STEREOTACTIC TREATMENT MANAGEMENT:  Following delivery, the patient was evaluated clinically. The patient tolerated treatment without significant acute effects, and was discharged to home in stable condition.    PLAN: Continue treatment as planned.  ________________________________  Blair Promise, PhD, MD

## 2014-10-21 ENCOUNTER — Ambulatory Visit: Payer: Medicare Other | Admitting: Radiation Oncology

## 2014-10-24 ENCOUNTER — Encounter: Payer: Self-pay | Admitting: Radiation Oncology

## 2014-10-24 ENCOUNTER — Ambulatory Visit
Admission: RE | Admit: 2014-10-24 | Discharge: 2014-10-24 | Disposition: A | Discharge status: Home / Self Care | Payer: Medicare Other | Source: Ambulatory Visit | Attending: Radiation Oncology | Admitting: Radiation Oncology

## 2014-10-24 DIAGNOSIS — C3411 Malignant neoplasm of upper lobe, right bronchus or lung: Secondary | ICD-10-CM | POA: Diagnosis not present

## 2014-10-24 NOTE — Progress Notes (Signed)
  Radiation Oncology         (336) [phone] ________________________________  Name: Raymond Chaney MRN: [id number]  Date: 10/24/2014  DOB: 1932/02/06  Stereotactic Body Radiotherapy Treatment Procedure Note  NARRATIVE:  Raymond Chaney was brought to the stereotactic radiation treatment machine and placed supine on the CT couch. The patient was set up for stereotactic body radiotherapy on the body fix pillow.  3D TREATMENT PLANNING AND DOSIMETRY:  The patient's radiation plan was reviewed and approved prior to starting treatment.  It showed 3-dimensional radiation distributions overlaid onto the planning CT.  The [facility] for the target structures as well as the organs at risk were reviewed. The documentation of this is filed in the radiation oncology EMR.  SIMULATION VERIFICATION:  The patient underwent CT imaging on the treatment unit.  These were carefully aligned to document that the ablative radiation dose would cover the target volume and maximally spare the nearby organs at risk according to the planned distribution.  SPECIAL TREATMENT PROCEDURE: Raymond Chaney received high dose ablative stereotactic body radiotherapy to the  right lung planned target volume without unforeseen complications. Treatment was delivered uneventfully.  Raymond Chaney received an additional fraction of 1000 cGy for a cumulative dose of 3000 cGy of a prescribed 5000 cGy in 5 sessions.  sessions.  The high doses associated with stereotactic body radiotherapy and the significant potential risks require careful treatment set up and patient monitoring constituting a special treatment procedure.   STEREOTACTIC TREATMENT MANAGEMENT:  Following delivery, the patient was evaluated clinically. The patient tolerated treatment without significant acute effects, and was discharged to home in stable condition.    PLAN: Continue treatment as planned.  ------------------------------------------------        Rexene Edison,  MD

## 2014-10-26 ENCOUNTER — Ambulatory Visit
Admission: RE | Admit: 2014-10-26 | Discharge: 2014-10-26 | Disposition: A | Discharge status: Home / Self Care | Payer: Medicare Other | Source: Ambulatory Visit | Attending: Radiation Oncology | Admitting: Radiation Oncology

## 2014-10-26 DIAGNOSIS — C3411 Malignant neoplasm of upper lobe, right bronchus or lung: Secondary | ICD-10-CM | POA: Diagnosis not present

## 2014-10-26 NOTE — Progress Notes (Signed)
   Radiation Oncology         [phone] ________________________________  Name: Raymond Chaney MRN: [id number]  Date: 10/26/2014  DOB: [birth year]  Stereotactic Body Radiotherapy Treatment Procedure Note   NARRATIVE: ESTEPHAN GALLARDO was brought to the stereotactic radiation treatment machine and placed supine on the CT couch. The patient was set up for stereotactic body radiotherapy on the body fix device.   3D TREATMENT PLANNING AND DOSIMETRY: The patient's radiation plan was reviewed and approved prior to starting treatment. It showed 3-dimensional radiation distributions overlaid onto the planning CT. The [facility] for the target structures as well as the organs at risk were reviewed. The documentation of this is filed in the radiation oncology EMR.   SIMULATION VERIFICATION: The patient underwent CT imaging on the treatment unit. These were carefully aligned to document that the ablative radiation dose would cover the target volume and maximally spare the nearby organs at risk according to the planned distribution.   SPECIAL TREATMENT PROCEDURE: [name] received high dose ablative stereotactic body radiotherapy to the planned target volume without unforeseen complications. Treatment was delivered uneventfully. The high doses associated with stereotactic body radiotherapy and the significant potential risks require careful treatment set up and patient monitoring constituting a special treatment procedure.   STEREOTACTIC TREATMENT MANAGEMENT: Following delivery, the patient was evaluated clinically. The patient tolerated treatment without significant acute effects, and was discharged to home in stable condition.   Fraction: 4  Dose:  40 Gy  PLAN: Continue treatment as planned.   ________________________________  Jodelle Gross, MD, PhD

## 2014-10-28 ENCOUNTER — Ambulatory Visit
Admission: RE | Admit: 2014-10-28 | Discharge: 2014-10-28 | Disposition: A | Discharge status: Home / Self Care | Payer: Medicare Other | Source: Ambulatory Visit | Attending: Radiation Oncology | Admitting: Radiation Oncology

## 2014-10-28 DIAGNOSIS — C3411 Malignant neoplasm of upper lobe, right bronchus or lung: Secondary | ICD-10-CM | POA: Diagnosis not present

## 2014-11-12 ENCOUNTER — Encounter: Payer: Self-pay | Admitting: Radiation Oncology

## 2014-11-12 NOTE — Progress Notes (Signed)
  Radiation Oncology         [phone] ________________________________  Name: Raymond Chaney MRN: [id number]  Date: 11/12/2014  DOB: 01-01-1932  End of Treatment Note  Diagnosis:  Clinical stage T2aN0M0 poorly differentiated adenocarcinoma of the lung    Indication for treatment:  Definitive/curative treatment       Radiation treatment dates:   February 9, February 11, February 15, February 17, February 19  Site/dose:   Right lateral lung mass 50 gray in 10 fractions  Beams/energy:   SBRT, VMAT, 6 MV photons  Narrative: The patient tolerated radiation treatment relatively well.   He experienced mild fatigue during the course of treatment.  Plan: The patient has completed radiation treatment. The patient will return to radiation oncology clinic for routine followup in one month. I advised them to call or return sooner if they have any questions or concerns related to their recovery or treatment.  -----------------------------------  Blair Promise, PhD, MD

## 2014-11-21 ENCOUNTER — Ambulatory Visit (INDEPENDENT_AMBULATORY_CARE_PROVIDER_SITE_OTHER): Payer: Medicare Other | Admitting: Pharmacist Clinician (PhC)/ Clinical Pharmacy Specialist

## 2014-11-21 DIAGNOSIS — I4891 Unspecified atrial fibrillation: Secondary | ICD-10-CM

## 2014-11-21 DIAGNOSIS — Z7901 Long term (current) use of anticoagulants: Secondary | ICD-10-CM | POA: Diagnosis not present

## 2014-11-21 LAB — POCT INR: INR: 1.7

## 2014-11-25 ENCOUNTER — Encounter: Payer: Self-pay | Admitting: Oncology

## 2014-12-01 ENCOUNTER — Encounter: Payer: Self-pay | Admitting: Radiation Oncology

## 2014-12-01 ENCOUNTER — Ambulatory Visit
Admission: RE | Admit: 2014-12-01 | Discharge: 2014-12-01 | Disposition: A | Discharge status: Home / Self Care | Payer: Medicare Other | Source: Ambulatory Visit | Attending: Radiation Oncology | Admitting: Radiation Oncology

## 2014-12-01 VITALS — BP 152/66 | HR 72 | Temp 98.5°F | Resp 24 | Wt 259.9 lb

## 2014-12-01 DIAGNOSIS — C3411 Malignant neoplasm of upper lobe, right bronchus or lung: Secondary | ICD-10-CM

## 2014-12-01 NOTE — Progress Notes (Signed)
He is currently in no pain.  Pt complains of loss of sleep-which he reports is due to him being unable to breath. He states it feels like his "airway is closing." Wheezing, Shortness of Breath -At Rest and Walking and Coughing  Productive and Color of Phlegm- Clear. Reports since his fifth radiation treatment he hasn't had blood sputum. Pt reports he wears 1.5L oxygen via nasal cannula. Pt denies dysphagia.  Pt reports arthritis and diabetes which he reports makes his legs hurt.  BP 152/66 mmHg  Pulse 72  Temp(Src) 98.5 F (36.9 C) (Oral)  Resp 24  Wt 259 lb 14.4 oz (117.89 kg)  SpO2 95%

## 2014-12-01 NOTE — Progress Notes (Signed)
  Radiation Oncology         [phone] ________________________________  Name: Raymond Chaney MRN: [id number]  Date: 12/01/2014  DOB: [birth year]  Follow-Up Visit Note  CC: Tivis Ringer, MD  Prince Solian, MD    ICD-9-CM ICD-10-CM   1. Cancer of upper lobe of right lung 162.3 C34.11     Diagnosis:   Clinical stage T2aN0M0 poorly differentiated adenocarcinoma of the lung  Interval Since Last Radiation:  5  weeks  Narrative:  The patient returns today for routine follow-up.  He is doing well at this time. He denies any pain in the chest area significant cough or hemoptysis. The patient has some dyspnea  with exertion but his breathing is no worse than his baseline.                           ALLERGIES:  is allergic to penicillins.  Meds: Current Outpatient Prescriptions  Medication Sig Dispense Refill  . allopurinol (ZYLOPRIM) 100 MG tablet Take 100 mg by mouth daily.    . Ascorbic Acid (VITAMIN C) 1000 MG tablet Take 1,000 mg by mouth daily.    Marland Kitchen doxazosin (CARDURA) 4 MG tablet Take 4 mg by mouth daily.    . fluticasone (FLONASE) 50 MCG/ACT nasal spray Place 2 sprays into the nose daily as needed.   0  . lisinopril (PRINIVIL,ZESTRIL) 10 MG tablet Take 10 mg by mouth daily.    . metFORMIN (GLUCOPHAGE) 1000 MG tablet Take 1,000 mg by mouth daily with breakfast.    . metoprolol tartrate (LOPRESSOR) 50 MG tablet Take 1 tablet (50 mg total) by mouth daily. 60 tablet 0  . mirabegron ER (MYRBETRIQ) 50 MG TB24 tablet Take 50 mg by mouth daily.    . pravastatin (PRAVACHOL) 80 MG tablet Take 80 mg by mouth daily.    Marland Kitchen warfarin (COUMADIN) 5 MG tablet Take 1 tablet by mouth daily or as directed (Patient taking differently: Take 2.5-5 mg by mouth daily. Take 1/2 tablet (2.5 mg) on Monday, Wednesday, Friday, take 1 tablet (5 mg) on Sunday, Tuesday, Thursday, Saturday) 90 tablet 3   No current facility-administered medications for this encounter.    Physical Findings: The  patient is in no acute distress. Patient is alert and oriented.  weight is 259 lb 14.4 oz (117.89 kg). His oral temperature is 98.5 F (36.9 C). His blood pressure is 152/66 and his pulse is 72. His respiration is 24 and oxygen saturation is 95%. .  No palpable supraclavicular or axillary adenopathy. The lungs are clear to auscultation. The heart has a regular rhythm and rate.  Lab Findings: Lab Results  Component Value Date   WBC 5.2 09/15/2014   HGB 12.2* 09/15/2014   HCT 37.6* 09/15/2014   MCV 96.9 09/15/2014   PLT 135* 09/15/2014    Radiographic Findings: No results found.  Impression:  The patient is recovering from the effects of radiation.  No evidence of recurrence on clinical exam today  Plan:  Routine follow-up in 3 months. Afterward the patient will be scheduled for a chest CT scan to assess his response to SBRT.  ____________________________________ Blair Promise, MD

## 2014-12-02 ENCOUNTER — Ambulatory Visit (INDEPENDENT_AMBULATORY_CARE_PROVIDER_SITE_OTHER): Payer: Medicare Other | Admitting: Pharmacist Clinician (PhC)/ Clinical Pharmacy Specialist

## 2014-12-02 DIAGNOSIS — I482 Chronic atrial fibrillation, unspecified: Secondary | ICD-10-CM

## 2014-12-02 LAB — POCT INR: INR: 2

## 2014-12-23 ENCOUNTER — Ambulatory Visit (INDEPENDENT_AMBULATORY_CARE_PROVIDER_SITE_OTHER): Payer: Medicare Other | Admitting: Pharmacist Clinician (PhC)/ Clinical Pharmacy Specialist

## 2014-12-23 DIAGNOSIS — Z7901 Long term (current) use of anticoagulants: Secondary | ICD-10-CM | POA: Diagnosis not present

## 2014-12-23 DIAGNOSIS — I4891 Unspecified atrial fibrillation: Secondary | ICD-10-CM

## 2014-12-23 LAB — POCT INR: INR: 2.2

## 2014-12-26 ENCOUNTER — Telehealth: Payer: Self-pay | Admitting: Oncology

## 2014-12-26 NOTE — Telephone Encounter (Signed)
Raymond Chaney called and said he has been coughing up blood, especially in the mornings.  He says the blood is bright red.  He is wondering when he should see Dr. Sondra Come.  Advised him that Dr. Sondra Come will be notified and we will call him back today.

## 2014-12-27 ENCOUNTER — Telehealth: Payer: Self-pay | Admitting: Oncology

## 2014-12-27 NOTE — Telephone Encounter (Signed)
Called Mr. Ringel back.  Advised him that Dr. Sondra Come has been notified of him coughing up blood in the mornings.  Advised him to call if the amount of blood increases or he starts seeing the blood all day long.  He verbalized agreement and will call if the amount of blood increases.  Transferred him to Suanne Marker, Art therapist to schedule an appointment for June with Dr. Sondra Come.

## 2014-12-30 ENCOUNTER — Encounter: Payer: Self-pay | Admitting: Cardiovascular Disease

## 2014-12-30 ENCOUNTER — Ambulatory Visit (INDEPENDENT_AMBULATORY_CARE_PROVIDER_SITE_OTHER): Payer: Medicare Other | Admitting: Cardiovascular Disease

## 2014-12-30 VITALS — BP 122/68 | HR 72 | Ht 69.0 in | Wt 266.3 lb

## 2014-12-30 DIAGNOSIS — I2510 Atherosclerotic heart disease of native coronary artery without angina pectoris: Secondary | ICD-10-CM

## 2014-12-30 DIAGNOSIS — I482 Chronic atrial fibrillation, unspecified: Secondary | ICD-10-CM

## 2014-12-30 DIAGNOSIS — I10 Essential (primary) hypertension: Secondary | ICD-10-CM

## 2014-12-30 DIAGNOSIS — I2583 Coronary atherosclerosis due to lipid rich plaque: Secondary | ICD-10-CM

## 2014-12-30 DIAGNOSIS — E782 Mixed hyperlipidemia: Secondary | ICD-10-CM | POA: Diagnosis not present

## 2014-12-30 NOTE — Assessment & Plan Note (Signed)
History of hyperlipidemia on Pravachol 80 followed by his PCP

## 2014-12-30 NOTE — Assessment & Plan Note (Signed)
History of CAD status post cardiac catheterization performed August 2008 by Dr. 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

## 2014-12-30 NOTE — Assessment & Plan Note (Signed)
History of chronic A. Fib rate controlled on Coumadin anticoagulation.

## 2014-12-30 NOTE — Progress Notes (Signed)
12/30/2014 PADEN Chaney   1932-01-18  [id number]  Primary Physician Tivis Ringer, MD Primary Cardiologist: Lorretta Harp MD Renae Gloss   HPI:  Mr. Barge is a 81 year old mildly overweight married Caucasian male, father of 25 and grandfather of 6 grandchildren, who I last saw in the office 04/06/14.Marland Kitchen He has a history of moderate CAD by catheterization performed by Dr. 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 His other problems include COPD with remote tobacco abuse, having quit 20 years ago, with dyspnea on exertion. He has hypertension, hyperlipidemia, insulin-dependent diabetes, and chronic atrial fibrillation, on Coumadin anticoagulation. He does have diabetic peripheral neuropathy by symptoms. His last Myoview, performed October 19, 2009, showed diaphragmatic attenuation versus inferior scar without ischemia. He has an excellent lipid profile performed by Dr. Dagmar Hait for secondary prevention. He is followed with obstructive sleep apnea by Dr. Claiborne Billings. Unfortunately the patient is unable to wear the CPAP.   He had a Myoview stress test performed 02/16/13 which was low risk.  2-D echocardiography performed 09/10/12 revealed an ejection fraction of 40-45%. Since I saw him back in July he developed lung cancer and has had radiation therapy. This was found because of hemoptysis.  Current Outpatient Prescriptions  Medication Sig Dispense Refill  . allopurinol (ZYLOPRIM) 100 MG tablet Take 100 mg by mouth daily.    . Ascorbic Acid (VITAMIN C) 1000 MG tablet Take 1,000 mg by mouth daily.    Marland Kitchen doxazosin (CARDURA) 4 MG tablet Take 4 mg by mouth daily.    . fluticasone (FLONASE) 50 MCG/ACT nasal spray Place 2 sprays into the nose daily as needed.   0  . lisinopril (PRINIVIL,ZESTRIL) 10 MG tablet Take 10 mg by mouth daily.    . metFORMIN (GLUCOPHAGE) 1000 MG tablet Take 1,000 mg by mouth daily with breakfast.    . metoprolol tartrate  (LOPRESSOR) 50 MG tablet Take 1 tablet (50 mg total) by mouth daily. 60 tablet 0  . mirabegron ER (MYRBETRIQ) 50 MG TB24 tablet Take 50 mg by mouth daily.    . pravastatin (PRAVACHOL) 80 MG tablet Take 80 mg by mouth daily.    Marland Kitchen warfarin (COUMADIN) 5 MG tablet Take 1 tablet by mouth daily or as directed (Patient taking differently: Take 2.5-5 mg by mouth daily. Take 1/2 tablet (2.5 mg) on Monday, Wednesday, Friday, take 1 tablet (5 mg) on Sunday, Tuesday, Thursday, Saturday) 90 tablet 3   No current facility-administered medications for this visit.    Allergies  Allergen Reactions  . Penicillins Rash    History   Social History  . Marital Status: Married    Spouse Name: N/A  . Number of Children: N/A  . Years of Education: N/A   Occupational History  . Not on file.   Social History Main Topics  . Smoking status: Former Smoker    Quit date: 09/08/1984  . Smokeless tobacco: Former Systems developer  . Alcohol Use: No     Comment: HX of Alcohol abuse    Quit  in 2004  . Drug Use: No  . Sexual Activity: Not on file   Other Topics Concern  . Not on file   Social History Narrative     Review of Systems: General: negative for chills, fever, night sweats or weight changes.  Cardiovascular: negative for chest pain, dyspnea on exertion, edema, orthopnea, palpitations, paroxysmal nocturnal dyspnea or shortness of breath Dermatological: negative for rash Respiratory: negative for cough or wheezing Urologic:  negative for hematuria Abdominal: negative for nausea, vomiting, diarrhea, bright red blood per rectum, melena, or hematemesis Neurologic: negative for visual changes, syncope, or dizziness All other systems reviewed and are otherwise negative except as noted above.    Blood pressure 122/68, pulse 72, height '5\' 9"'$  (1.753 m), weight 266 lb 4.8 oz (120.793 kg).  General appearance: alert and no distress Neck: no adenopathy, no carotid bruit, no JVD, supple, symmetrical, trachea midline  and thyroid not enlarged, symmetric, no tenderness/mass/nodules Lungs: clear to auscultation bilaterally Heart: irregularly irregular rhythm Extremities: extremities normal, atraumatic, no cyanosis or edema  EKG aatrial fibrillation with a ventricular response of 72. I personally reviewed  this EKG  ASSESSMENT AND PLAN:   Mixed hyperlipidemia with apolipoprotein E4 variant History of hyperlipidemia on Pravachol 80 followed by his PCP   Essential hypertension History of hypertension with blood pressure measured 122/68. 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   Atrial fibrillation History of chronic A. Fib rate controlled on Coumadin anticoagulation.       Lorretta Harp MD FACP,FACC,FAHA, [facility] Raymond 12/30/2014 2:58 PM

## 2014-12-30 NOTE — Assessment & Plan Note (Signed)
History of hypertension with blood pressure measured 122/68. He is on Prinivil and Lopressor and continue current meds at current dosing

## 2014-12-30 NOTE — Patient Instructions (Signed)
Dr Berry recommends that you schedule a follow-up appointment in 1 year. You will receive a reminder letter in the mail two months in advance. If you don't receive a letter, please call our office to schedule the follow-up appointment. 

## 2015-01-10 ENCOUNTER — Telehealth: Payer: Self-pay | Admitting: Oncology

## 2015-01-10 NOTE — Telephone Encounter (Signed)
Mr. Brull called and left a message saying he had some questions.  Called him back and left a message requested a return call.

## 2015-01-11 ENCOUNTER — Encounter (HOSPITAL_COMMUNITY): Payer: Self-pay

## 2015-01-11 ENCOUNTER — Ambulatory Visit (HOSPITAL_COMMUNITY)
Admission: RE | Admit: 2015-01-11 | Discharge: 2015-01-11 | Disposition: A | Discharge status: Home / Self Care | Payer: Medicare Other | Source: Ambulatory Visit | Attending: Radiation Oncology | Admitting: Radiation Oncology

## 2015-01-11 ENCOUNTER — Other Ambulatory Visit: Payer: Self-pay | Admitting: Radiation Oncology

## 2015-01-11 ENCOUNTER — Telehealth: Payer: Self-pay | Admitting: Oncology

## 2015-01-11 ENCOUNTER — Telehealth: Payer: Self-pay | Admitting: *Deleted

## 2015-01-11 DIAGNOSIS — C3411 Malignant neoplasm of upper lobe, right bronchus or lung: Secondary | ICD-10-CM

## 2015-01-11 DIAGNOSIS — Z85118 Personal history of other malignant neoplasm of bronchus and lung: Secondary | ICD-10-CM | POA: Diagnosis not present

## 2015-01-11 DIAGNOSIS — R042 Hemoptysis: Secondary | ICD-10-CM | POA: Insufficient documentation

## 2015-01-11 DIAGNOSIS — Z923 Personal history of irradiation: Secondary | ICD-10-CM | POA: Diagnosis not present

## 2015-01-11 NOTE — Telephone Encounter (Signed)
Mr. Howington called and said that he has been coughing up more blood.  He said it is bright red and the size of a quarter.  He says it happens 7-8 times throughout the day.  He reports he is always short of breath but it not any worse.  He also has concerns about a bill he received for $240.00.  Advised him that the doctor on call would be contacted and we would call him back.  Also advised him that Levada Dy, [employer] will be contacted and will call him back.  Mr. Payes verbalized agreement.  Per Dr. Pablo Ledger, Mr. Burkitt needs a CT of his chest today.  Called Mr. Rampy back and advised him that a CT scan will be scheduled today and we will call him back with a time.  Mr. Fouse verbalized agreement.

## 2015-01-11 NOTE — Telephone Encounter (Signed)
Called Raymond Chaney regarding his CT results.  Advised him that per Dr. Pablo Ledger, there is an area on the outside of his lung that is most likely causing the bleeding.  Advised him that Dr. Pablo Ledger does not think he is at risk for a large bleed but did advise if he brings up a cupful of blood, to go the ER.  Also notified him that we have scheduled him for a follow up with Dr. Sondra Come on Monday at 4:45 to further discuss the results.  [name] verbalized agreement and understanding.

## 2015-01-11 NOTE — Telephone Encounter (Signed)
Received call from [place] @ [place] for call report on CT scan done today.  Report printed and gave to Dr. Pablo Ledger in radiation. [facility]  Phone    [phone].

## 2015-01-16 ENCOUNTER — Ambulatory Visit: Payer: Medicare Other | Admitting: Radiation Oncology

## 2015-01-17 ENCOUNTER — Ambulatory Visit
Admission: RE | Admit: 2015-01-17 | Discharge: 2015-01-17 | Disposition: A | Discharge status: Home / Self Care | Payer: Medicare Other | Source: Ambulatory Visit | Attending: Radiation Oncology | Admitting: Radiation Oncology

## 2015-01-17 ENCOUNTER — Encounter: Payer: Self-pay | Admitting: Radiation Oncology

## 2015-01-17 VITALS — BP 110/72 | HR 88 | Temp 97.6°F | Resp 20 | Ht 69.0 in | Wt 259.6 lb

## 2015-01-17 DIAGNOSIS — C7802 Secondary malignant neoplasm of left lung: Secondary | ICD-10-CM

## 2015-01-17 DIAGNOSIS — C7800 Secondary malignant neoplasm of unspecified lung: Secondary | ICD-10-CM | POA: Insufficient documentation

## 2015-01-17 NOTE — Progress Notes (Signed)
Raymond Chaney here for follow up.  He denies pain.  He reports shortness of breath with a small amount of activity.  He reports coughing up black/red blood frequently, about the size of a quarter.  He says it is sometimes thick "like jello."  He reports the bleeding started about 3 weeks after radiation and has gradually increased.  He is taking coumadin.  He reports a good appetite.  He reports fatigue.  BP 110/72 mmHg  Pulse 88  Temp(Src) 97.6 F (36.4 C) (Oral)  Resp 20  Ht '5\' 9"'$  (1.753 m)  Wt 259 lb 9.6 oz (117.754 kg)  BMI 38.32 kg/m2  SpO2 96%

## 2015-01-17 NOTE — Progress Notes (Signed)
Radiation Oncology         [phone] ________________________________  Name: Raymond Chaney MRN: [id number]  Date: 01/17/2015  DOB: [birth year]  Follow-Up Visit Note  CC: Tivis Ringer, MD  Prince Solian, MD    ICD-9-CM ICD-10-CM   1. Secondary malignant neoplasm of left lung 197.0 C78.02     Diagnosis:  Clinical stage T2aN0M0 poorly differentiated adenocarcinoma of the right upper  lung,  now with oligo- metastasis involving the left lower lung and associated hemoptysis  Interval Since Last Radiation:  3  months  Narrative:  The patient returns today earlier than his scheduled follow-up appointment. The past few weeks the patient has had hemoptysis. He describes this as approximately a quarter in size and has several times during the day. Sometimes this is bright red and sometimes dark red. He denies any pain within the chest area. His breathing has been stable over the past 6 months area. In light of the patient's new problem with hemoptysis he underwent a CT scan which is documented below. This showed excellent response to his SBRT involving his right upper lung lesion however the patient was noted to have 2 small nodules in the left lower lung with associated hemorrhage possibly a small third lesion. Light of these new lesions and the fact that the patient has hemoptysis is now seen for consideration for additional treatment.                              ALLERGIES:  is allergic to penicillins.  Meds: Current Outpatient Prescriptions  Medication Sig Dispense Refill  . allopurinol (ZYLOPRIM) 100 MG tablet Take 100 mg by mouth daily.    . Ascorbic Acid (VITAMIN C) 1000 MG tablet Take 1,000 mg by mouth daily.    Marland Kitchen doxazosin (CARDURA) 4 MG tablet Take 4 mg by mouth daily.    . fluticasone (FLONASE) 50 MCG/ACT nasal spray Place 2 sprays into the nose daily as needed.   0  . lisinopril (PRINIVIL,ZESTRIL) 10 MG tablet Take 10 mg by mouth daily.    . metFORMIN (GLUCOPHAGE)  1000 MG tablet Take 1,000 mg by mouth daily with breakfast.    . metoprolol tartrate (LOPRESSOR) 50 MG tablet Take 1 tablet (50 mg total) by mouth daily. 60 tablet 0  . mirabegron ER (MYRBETRIQ) 50 MG TB24 tablet Take 50 mg by mouth daily.    . pravastatin (PRAVACHOL) 80 MG tablet Take 80 mg by mouth daily.    Marland Kitchen warfarin (COUMADIN) 5 MG tablet Take 1 tablet by mouth daily or as directed (Patient taking differently: Take 2.5-5 mg by mouth daily. Take 1/2 tablet (2.5 mg) on Monday, Wednesday, Friday, take 1 tablet (5 mg) on Sunday, Tuesday, Thursday, Saturday) 90 tablet 3   No current facility-administered medications for this encounter.    Physical Findings: The patient is in no acute distress. Patient is alert and oriented. acCompanied by his wife on evaluation today  height is '5\' 9"'$  (1.753 m) and weight is 259 lb 9.6 oz (117.754 kg). His oral temperature is 97.6 F (36.4 C). His blood pressure is 110/72 and his pulse is 88. His respiration is 20 and oxygen saturation is 96%. . No palpable supraclavicular or axillary adenopathy. Lungs clear to auscultation. The heart has a regular rhythm and rate at this time.  Lab Findings: Lab Results  Component Value Date   WBC 5.2 09/15/2014   HGB 12.2* 09/15/2014   HCT  37.6* 09/15/2014   MCV 96.9 09/15/2014   PLT 135* 09/15/2014    Radiographic Findings: Ct Chest Wo Contrast  01/11/2015   CLINICAL DATA:  One-week history of hemoptysis. History of right lung cancer status post radiation therapy.  EXAM: CT CHEST WITHOUT CONTRAST  TECHNIQUE: Multidetector CT imaging of the chest was performed following the standard protocol without IV contrast.  COMPARISON:  CT scan 08/25/2014 and PET-CT 09/01/2014  FINDINGS: Chest wall: No chest wall mass, supraclavicular or axillary lymphadenopathy. The thyroid gland is grossly normal. The bony thorax is intact. No destructive bone lesions or spinal canal compromise. Stable advanced degenerative changes involving the  thoracic spine and sternoclavicular joints.  Mediastinum: The heart is normal in size. No pericardial effusion. No mediastinal or hilar mass or lymphadenopathy. A few small scattered lymph nodes are stable. Stable three-vessel coronary artery calcifications and atherosclerotic calcifications involving the aorta and branch vessels. No focal aneurysm. The esophagus is grossly normal.  Lungs/ pleura: The right upper lobe cavitary lesion is significantly improved when compared to prior examinations. On the most recent CT scan from 09/15/2014 where the lesion was biopsied it measured 31.5 x 29.5 mm. It now measures 16 x 11 mm. There are however 2 new left lower lobe pulmonary nodules. The largest lesion measures 16 mm on image number 41 in the smaller adjacent lesion measures 5.5 mm on image number 45. AC ground-glass opacity surrounds both lesions and is consistent with surrounding hemorrhage. This likely explains the patient's recent hemoptysis. There is also a 4.5 mm nodule in the left lower lobe on image number 36.  No pleural effusion.  Upper abdomen: Stable slight nodularity involving the lateral limb of the right adrenal gland and stable small lesion involving the right adrenal gland.  IMPRESSION: 1. Significant interval decrease in size of the right upper lobe pulmonary lesion as discussed above. This would suggest a good response to radiation therapy. 2. Two new left lower lobe pulmonary lesions with surrounding hemorrhage likely accounting for the patient's hemoptysis. This is likely metastatic disease. There is also a third nodule in the left lower lobe measuring 4.5 mm. 3. No mediastinal or hilar mass or adenopathy. 4. Stable small adrenal gland nodules.   Electronically Signed   By: Marijo Sanes M.D.   On: 01/11/2015 12:23    Impression:  Clinical stage T2aN0M0 poorly differentiated adenocarcinoma of the right upper  lung,  now with oligo- metastasis involving the left lower lung and associated  hemoptysis. In light of the the patient's medical issues he is not a good surgical candidate. The patient's lesions are in close proximity to one another and the patient would be a candidate for SBRT involving his new lesions.  I discussed treatment course side effects and potential toxicities of radiation therapy in this situation with the patient and his wife. Patient appears to understand and wishes to proceed with planned course of treatment.  Plan:  SBRT simulation on May 12 with treatments to begin the next several days. Anticipate between 3 and 5 radiation treatments directed at his new lesions in the left lower lung area.  ____________________________________ Blair Promise, MD

## 2015-01-18 ENCOUNTER — Ambulatory Visit: Payer: Medicare Other | Admitting: Radiation Oncology

## 2015-01-19 ENCOUNTER — Ambulatory Visit
Admission: RE | Admit: 2015-01-19 | Discharge: 2015-01-19 | Disposition: A | Discharge status: Home / Self Care | Payer: Medicare Other | Source: Ambulatory Visit | Attending: Radiation Oncology | Admitting: Radiation Oncology

## 2015-01-19 DIAGNOSIS — C7802 Secondary malignant neoplasm of left lung: Secondary | ICD-10-CM | POA: Insufficient documentation

## 2015-01-19 DIAGNOSIS — C3411 Malignant neoplasm of upper lobe, right bronchus or lung: Secondary | ICD-10-CM | POA: Diagnosis not present

## 2015-01-19 DIAGNOSIS — Z510 Encounter for antineoplastic radiation therapy: Secondary | ICD-10-CM | POA: Diagnosis present

## 2015-01-19 DIAGNOSIS — R042 Hemoptysis: Secondary | ICD-10-CM | POA: Diagnosis not present

## 2015-01-20 ENCOUNTER — Ambulatory Visit (INDEPENDENT_AMBULATORY_CARE_PROVIDER_SITE_OTHER): Payer: Medicare Other | Admitting: Pharmacist Clinician (PhC)/ Clinical Pharmacy Specialist

## 2015-01-20 DIAGNOSIS — Z7901 Long term (current) use of anticoagulants: Secondary | ICD-10-CM | POA: Diagnosis not present

## 2015-01-20 DIAGNOSIS — I4891 Unspecified atrial fibrillation: Secondary | ICD-10-CM

## 2015-01-20 LAB — POCT INR: INR: 2.4

## 2015-01-24 NOTE — Progress Notes (Signed)
  Radiation Oncology         [phone] ________________________________  Name: Raymond Chaney MRN: [id number]  Date: 01/19/2015  DOB: 09/18/1931  SIMULATION AND TREATMENT PLANNING NOTE    ICD-9-CM ICD-10-CM   1. 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  Identity was confirmed.  All relevant records and images related to the planned course of therapy were reviewed.  The patient freely provided informed written consent to proceed with treatment after reviewing the details related to the planned course of therapy. The consent form was witnessed and verified by the simulation staff.  Then, the patient was set-up in a stable reproducible  supine position for radiation therapy.  CT images were obtained.  Surface markings were placed.  The CT images were loaded into the planning software.  Then the target and avoidance structures were contoured.  Treatment planning then occurred.  The radiation prescription was entered and confirmed.  Then, I designed and supervised the construction of a total of 2 medically necessary complex treatment devices.  I have requested : This treatment constitutes a Special Treatment Procedure for the following reason: [ High dose per fraction requiring special monitoring for increased toxicities of treatment including daily imaging..  I have ordered:dose calc.  PLAN:  The patient will receive 50 Gy in 10 fractions, directed at the area of metastasis,  the area of hemorrhage will receive 30 in 10 fractions ( simultaneous integrated boost technique )  ________________________________  -----------------------------------  Blair Promise, PhD, MD

## 2015-01-26 DIAGNOSIS — Z510 Encounter for antineoplastic radiation therapy: Secondary | ICD-10-CM | POA: Diagnosis not present

## 2015-01-31 ENCOUNTER — Encounter: Payer: Self-pay | Admitting: Radiation Oncology

## 2015-01-31 ENCOUNTER — Ambulatory Visit
Admission: RE | Admit: 2015-01-31 | Discharge: 2015-01-31 | Disposition: A | Discharge status: Home / Self Care | Payer: Medicare Other | Source: Ambulatory Visit | Attending: Radiation Oncology | Admitting: Radiation Oncology

## 2015-01-31 VITALS — BP 123/48 | HR 80 | Temp 97.9°F | Resp 20 | Ht 69.0 in | Wt 261.3 lb

## 2015-01-31 DIAGNOSIS — Z510 Encounter for antineoplastic radiation therapy: Secondary | ICD-10-CM | POA: Diagnosis not present

## 2015-01-31 DIAGNOSIS — C7802 Secondary malignant neoplasm of left lung: Secondary | ICD-10-CM

## 2015-01-31 NOTE — Progress Notes (Signed)
Raymond Chaney has completed 1 fraction to his left lower lung.  He denies pain.  He continues to cough up dark red blood several times a day.  He reports his shortness of breath is worse.  He uses a breathing machine at night as needed.  He denies fatigue.  BP 123/48 mmHg  Pulse 80  Temp(Src) 97.9 F (36.6 C) (Oral)  Resp 20  Ht '5\' 9"'$  (1.753 m)  Wt 261 lb 4.8 oz (118.525 kg)  BMI 38.57 kg/m2  SpO2 97%

## 2015-01-31 NOTE — Progress Notes (Signed)
  Radiation Oncology         [phone] ________________________________  Name: ROSSER COLLINGTON MRN: [id number]  Date: 01/31/2015  DOB: 03/06/1932  Weekly Radiation Therapy Management  DIAGNOSIS: Clinical stage T2aN0M0 poorly differentiated adenocarcinoma of the right upper lung, now with oligo- metastasis involving the left lower lung and associated hemoptysis  Current Dose: 5 Gy     Planned Dose:  50 Gy  Narrative . . . . . . . . The patient presents for routine under treatment assessment.                                   The patient is without complaint. He continues to have some mild hemoptysis. No breathing problems or bleeding elsewhere                                 Set-up films were reviewed.                                 The chart was checked. Physical Findings. . .  height is '5\' 9"'$  (1.753 m) and weight is 261 lb 4.8 oz (118.525 kg). His oral temperature is 97.9 F (36.6 C). His blood pressure is 123/48 and his pulse is 80. His respiration is 20 and oxygen saturation is 97%. . The lungs are clear. The heart has a regular rhythm and rate. Impression . . . . . . . The patient is tolerating radiation. Plan . . . . . . . . . . . . Continue treatment as planned.  ________________________________   Blair Promise, PhD, MD

## 2015-02-01 ENCOUNTER — Ambulatory Visit
Admission: RE | Admit: 2015-02-01 | Discharge: 2015-02-01 | Disposition: A | Discharge status: Home / Self Care | Payer: Medicare Other | Source: Ambulatory Visit | Attending: Radiation Oncology | Admitting: Radiation Oncology

## 2015-02-01 DIAGNOSIS — Z510 Encounter for antineoplastic radiation therapy: Secondary | ICD-10-CM | POA: Diagnosis not present

## 2015-02-01 DIAGNOSIS — C7802 Secondary malignant neoplasm of left lung: Secondary | ICD-10-CM

## 2015-02-02 ENCOUNTER — Ambulatory Visit
Admission: RE | Admit: 2015-02-02 | Discharge: 2015-02-02 | Disposition: A | Discharge status: Home / Self Care | Payer: Medicare Other | Source: Ambulatory Visit | Attending: Radiation Oncology | Admitting: Radiation Oncology

## 2015-02-02 DIAGNOSIS — Z510 Encounter for antineoplastic radiation therapy: Secondary | ICD-10-CM | POA: Diagnosis not present

## 2015-02-02 DIAGNOSIS — C3411 Malignant neoplasm of upper lobe, right bronchus or lung: Secondary | ICD-10-CM

## 2015-02-03 ENCOUNTER — Ambulatory Visit
Admission: RE | Admit: 2015-02-03 | Discharge: 2015-02-03 | Disposition: A | Discharge status: Home / Self Care | Payer: Medicare Other | Source: Ambulatory Visit | Attending: Radiation Oncology | Admitting: Radiation Oncology

## 2015-02-03 DIAGNOSIS — Z510 Encounter for antineoplastic radiation therapy: Secondary | ICD-10-CM | POA: Diagnosis not present

## 2015-02-06 ENCOUNTER — Ambulatory Visit: Payer: Medicare Other | Admitting: Radiation Oncology

## 2015-02-07 ENCOUNTER — Encounter: Payer: Self-pay | Admitting: Radiation Oncology

## 2015-02-07 ENCOUNTER — Ambulatory Visit
Admission: RE | Admit: 2015-02-07 | Discharge: 2015-02-07 | Disposition: A | Discharge status: Home / Self Care | Payer: Medicare Other | Source: Ambulatory Visit | Attending: Radiation Oncology | Admitting: Radiation Oncology

## 2015-02-07 ENCOUNTER — Ambulatory Visit: Payer: Medicare Other | Admitting: Radiation Oncology

## 2015-02-07 VITALS — BP 120/69 | HR 85 | Temp 97.5°F | Resp 20 | Ht 69.0 in | Wt 263.4 lb

## 2015-02-07 DIAGNOSIS — C3411 Malignant neoplasm of upper lobe, right bronchus or lung: Secondary | ICD-10-CM

## 2015-02-07 DIAGNOSIS — Z510 Encounter for antineoplastic radiation therapy: Secondary | ICD-10-CM | POA: Diagnosis not present

## 2015-02-07 NOTE — Progress Notes (Signed)
Raymond Chaney has completed 5 fractions to his left lower lung.  He reports feeling more short of breath since radiation has started.  He reports the congestion in his nose is worse.  He reports an occasional cough.  He says he is not coughing up as much blood as he was.  He reports they look like clots.  His skin is intact on his left chest.  He reports fatigue.  BP 120/69 mmHg  Pulse 85  Temp(Src) 97.5 F (36.4 C) (Oral)  Resp 20  Ht '5\' 9"'$  (1.753 m)  Wt 263 lb 6.4 oz (119.477 kg)  BMI 38.88 kg/m2  SpO2 97%

## 2015-02-07 NOTE — Progress Notes (Signed)
  Radiation Oncology         [phone] ________________________________  Name: Raymond Chaney MRN: [id number]  Date: 02/07/2015  DOB: [birth year]  Weekly Radiation Therapy Management  DIAGNOSIS: Clinical stage T2aN0M0 poorly differentiated adenocarcinoma of the right upper lung, now with oligo- metastasis involving the left lower lung and associated hemoptysis  Current Dose: 25 Gy     Planned Dose:  50 Gy  Narrative . . . . . . . . The patient presents for routine under treatment assessment.                                    He reports feeling more short of breath since radiation has started. He reports the congestion in his nose is worse. He reports an occasional cough. He says he is not coughing up as much blood as he was. He reports they look like clots.                                 Set-up films were reviewed.                                 The chart was checked. Physical Findings. . .  height is '5\' 9"'$  (1.753 m) and weight is 263 lb 6.4 oz (119.477 kg). His oral temperature is 97.5 F (36.4 C). His blood pressure is 120/69 and his pulse is 85. His respiration is 20 and oxygen saturation is 97%. . Weight essentially stable.  The lungs are clear. Impression . . . . . . . The patient is tolerating radiation. Plan . . . . . . . . . . . . Continue treatment as planned.  ________________________________   Blair Promise, PhD, MD

## 2015-02-08 ENCOUNTER — Ambulatory Visit
Admission: RE | Admit: 2015-02-08 | Discharge: 2015-02-08 | Disposition: A | Discharge status: Home / Self Care | Payer: Medicare Other | Source: Ambulatory Visit | Attending: Radiation Oncology | Admitting: Radiation Oncology

## 2015-02-08 DIAGNOSIS — C7802 Secondary malignant neoplasm of left lung: Secondary | ICD-10-CM

## 2015-02-08 DIAGNOSIS — Z510 Encounter for antineoplastic radiation therapy: Secondary | ICD-10-CM | POA: Diagnosis not present

## 2015-02-09 ENCOUNTER — Ambulatory Visit: Payer: Medicare Other | Admitting: Radiation Oncology

## 2015-02-09 ENCOUNTER — Ambulatory Visit
Admission: RE | Admit: 2015-02-09 | Discharge: 2015-02-09 | Disposition: A | Discharge status: Home / Self Care | Payer: Medicare Other | Source: Ambulatory Visit | Attending: Radiation Oncology | Admitting: Radiation Oncology

## 2015-02-09 DIAGNOSIS — Z510 Encounter for antineoplastic radiation therapy: Secondary | ICD-10-CM | POA: Diagnosis not present

## 2015-02-09 DIAGNOSIS — C7802 Secondary malignant neoplasm of left lung: Secondary | ICD-10-CM

## 2015-02-10 ENCOUNTER — Ambulatory Visit
Admission: RE | Admit: 2015-02-10 | Discharge: 2015-02-10 | Disposition: A | Discharge status: Home / Self Care | Payer: Medicare Other | Source: Ambulatory Visit | Attending: Radiation Oncology | Admitting: Radiation Oncology

## 2015-02-10 DIAGNOSIS — Z510 Encounter for antineoplastic radiation therapy: Secondary | ICD-10-CM | POA: Diagnosis not present

## 2015-02-13 ENCOUNTER — Ambulatory Visit
Admission: RE | Admit: 2015-02-13 | Discharge: 2015-02-13 | Disposition: A | Discharge status: Home / Self Care | Payer: Medicare Other | Source: Ambulatory Visit | Attending: Radiation Oncology | Admitting: Radiation Oncology

## 2015-02-13 DIAGNOSIS — Z510 Encounter for antineoplastic radiation therapy: Secondary | ICD-10-CM | POA: Diagnosis not present

## 2015-02-14 ENCOUNTER — Encounter: Payer: Self-pay | Admitting: Radiation Oncology

## 2015-02-14 ENCOUNTER — Ambulatory Visit
Admission: RE | Admit: 2015-02-14 | Discharge: 2015-02-14 | Disposition: A | Discharge status: Home / Self Care | Payer: Medicare Other | Source: Ambulatory Visit | Attending: Radiation Oncology | Admitting: Radiation Oncology

## 2015-02-14 VITALS — BP 126/43 | HR 74 | Temp 98.0°F | Resp 20 | Ht 69.0 in | Wt 260.7 lb

## 2015-02-14 DIAGNOSIS — C7802 Secondary malignant neoplasm of left lung: Secondary | ICD-10-CM

## 2015-02-14 DIAGNOSIS — Z510 Encounter for antineoplastic radiation therapy: Secondary | ICD-10-CM | POA: Diagnosis not present

## 2015-02-14 NOTE — Progress Notes (Signed)
Raymond Chaney has completed treatment with 10 fractions to his left lower lung.  He denies pain.  He continues to have shortness of breath.  He reports an occasional cough, sometimes with a small amount of blood.  He denies pain with swallowing or having a sore throat.  The skin on his left chest and back is intact.  He has a follow up scheduled for 03/20/15.  BP 126/43 mmHg  Pulse 74  Temp(Src) 98 F (36.7 C) (Oral)  Resp 20  Ht '5\' 9"'$  (1.753 m)  Wt 260 lb 11.2 oz (118.253 kg)  BMI 38.48 kg/m2  SpO2 97%

## 2015-02-14 NOTE — Progress Notes (Signed)
  Radiation Oncology         [phone] ________________________________  Name: Raymond Chaney MRN: [id number]  Date: 02/14/2015  DOB: Jun 25, 1932  Weekly Radiation Therapy Management  DIAGNOSIS: Clinical stage T2aN0M0 poorly differentiated adenocarcinoma of the right upper lung, now with oligo- metastasis involving the left lower lung and associated hemoptysis  Current Dose: 50 Gy     Planned Dose:  50 Gy  Narrative . . . . . . . . The patient presents for routine under treatment assessment.                                   The patient is without complaint. His hemoptysis has improved significantly.                                 Set-up films were reviewed.                                 The chart was checked. Physical Findings. . .  height is '5\' 9"'$  (1.753 m) and weight is 260 lb 11.2 oz (118.253 kg). His oral temperature is 98 F (36.7 C). His blood pressure is 126/43 and his pulse is 74. His respiration is 20 and oxygen saturation is 97%. . The lungs are clear. Impression . . . . . . . The patient is tolerating radiation. Plan . . . . . . . . . . . . Routine follow-up in one month  ________________________________   Blair Promise, PhD, MD

## 2015-02-17 ENCOUNTER — Ambulatory Visit: Payer: Medicare Other | Admitting: Pharmacist Clinician (PhC)/ Clinical Pharmacy Specialist

## 2015-02-19 NOTE — Progress Notes (Signed)
  Radiation Oncology         [phone] ________________________________  Name: Raymond Chaney MRN: [id number]  Date: 02/14/2015  DOB: [birth year]  End of Treatment Note     ICD-9-CM ICD-10-CM   1. Secondary malignant neoplasm of left lung 197.0 C78.02     DIAGNOSIS: Clinical stage T2aN0M0 poorly differentiated adenocarcinoma of the right upper lung, now with oligo- metastasis involving the left lower lung and associated hemoptysis     Indication for treatment:  Hemoptysis       Radiation treatment dates:   01/31/2015-02/14/2015  Site/dose:   Left lower lobe nodule, 50 gray in 10 fractions  Beams/energy:   3-D conformal using 3 separate beams  Narrative: The patient tolerated radiation treatment relatively well.   His bleeding essentially resolved at the completion of therapy.  Plan: The patient has completed radiation treatment. The patient will return to radiation oncology clinic for routine followup in one month. I advised them to call or return sooner if they have any questions or concerns related to their recovery or treatment.  -----------------------------------  Blair Promise, PhD, MD

## 2015-02-20 ENCOUNTER — Ambulatory Visit (INDEPENDENT_AMBULATORY_CARE_PROVIDER_SITE_OTHER): Payer: Medicare Other | Admitting: Pharmacist Clinician (PhC)/ Clinical Pharmacy Specialist

## 2015-02-20 DIAGNOSIS — Z7901 Long term (current) use of anticoagulants: Secondary | ICD-10-CM

## 2015-02-20 DIAGNOSIS — I4891 Unspecified atrial fibrillation: Secondary | ICD-10-CM | POA: Diagnosis not present

## 2015-02-20 LAB — POCT INR: INR: 1.8

## 2015-03-01 ENCOUNTER — Encounter: Payer: Self-pay | Admitting: Cardiovascular Disease

## 2015-03-01 ENCOUNTER — Telehealth: Payer: Self-pay | Admitting: Cardiovascular Disease

## 2015-03-02 ENCOUNTER — Inpatient Hospital Stay (HOSPITAL_COMMUNITY)
Admission: EM | Admit: 2015-03-02 | Discharge: 2015-03-05 | DRG: 054 | Disposition: A | Discharge status: Home / Self Care | Payer: Medicare Other | Attending: Neurology | Admitting: Neurology

## 2015-03-02 ENCOUNTER — Other Ambulatory Visit: Payer: Self-pay | Admitting: Internal Medicine

## 2015-03-02 ENCOUNTER — Ambulatory Visit
Admission: RE | Admit: 2015-03-02 | Discharge: 2015-03-02 | Disposition: A | Discharge status: Home / Self Care | Payer: Medicare Other | Source: Ambulatory Visit | Attending: Internal Medicine | Admitting: Internal Medicine

## 2015-03-02 DIAGNOSIS — Z923 Personal history of irradiation: Secondary | ICD-10-CM

## 2015-03-02 DIAGNOSIS — Z85118 Personal history of other malignant neoplasm of bronchus and lung: Secondary | ICD-10-CM

## 2015-03-02 DIAGNOSIS — I619 Nontraumatic intracerebral hemorrhage, unspecified: Secondary | ICD-10-CM | POA: Diagnosis present

## 2015-03-02 DIAGNOSIS — I611 Nontraumatic intracerebral hemorrhage in hemisphere, cortical: Secondary | ICD-10-CM | POA: Diagnosis not present

## 2015-03-02 DIAGNOSIS — Z96659 Presence of unspecified artificial knee joint: Secondary | ICD-10-CM | POA: Diagnosis present

## 2015-03-02 DIAGNOSIS — Z6834 Body mass index (BMI) 34.0-34.9, adult: Secondary | ICD-10-CM

## 2015-03-02 DIAGNOSIS — S06320A Contusion and laceration of left cerebrum without loss of consciousness, initial encounter: Secondary | ICD-10-CM

## 2015-03-02 DIAGNOSIS — E782 Mixed hyperlipidemia: Secondary | ICD-10-CM | POA: Diagnosis present

## 2015-03-02 DIAGNOSIS — Z7901 Long term (current) use of anticoagulants: Secondary | ICD-10-CM

## 2015-03-02 DIAGNOSIS — M109 Gout, unspecified: Secondary | ICD-10-CM | POA: Diagnosis present

## 2015-03-02 DIAGNOSIS — E119 Type 2 diabetes mellitus without complications: Secondary | ICD-10-CM | POA: Diagnosis present

## 2015-03-02 DIAGNOSIS — I610 Nontraumatic intracerebral hemorrhage in hemisphere, subcortical: Secondary | ICD-10-CM | POA: Diagnosis present

## 2015-03-02 DIAGNOSIS — C799 Secondary malignant neoplasm of unspecified site: Secondary | ICD-10-CM | POA: Diagnosis not present

## 2015-03-02 DIAGNOSIS — R51 Headache: Principal | ICD-10-CM

## 2015-03-02 DIAGNOSIS — Z8673 Personal history of transient ischemic attack (TIA), and cerebral infarction without residual deficits: Secondary | ICD-10-CM

## 2015-03-02 DIAGNOSIS — I4891 Unspecified atrial fibrillation: Secondary | ICD-10-CM | POA: Diagnosis not present

## 2015-03-02 DIAGNOSIS — I482 Chronic atrial fibrillation: Secondary | ICD-10-CM | POA: Diagnosis present

## 2015-03-02 DIAGNOSIS — D649 Anemia, unspecified: Secondary | ICD-10-CM | POA: Diagnosis present

## 2015-03-02 DIAGNOSIS — I2510 Atherosclerotic heart disease of native coronary artery without angina pectoris: Secondary | ICD-10-CM | POA: Diagnosis present

## 2015-03-02 DIAGNOSIS — I618 Other nontraumatic intracerebral hemorrhage: Secondary | ICD-10-CM | POA: Diagnosis not present

## 2015-03-02 DIAGNOSIS — Z7951 Long term (current) use of inhaled steroids: Secondary | ICD-10-CM

## 2015-03-02 DIAGNOSIS — G936 Cerebral edema: Secondary | ICD-10-CM | POA: Diagnosis present

## 2015-03-02 DIAGNOSIS — Z79899 Other long term (current) drug therapy: Secondary | ICD-10-CM

## 2015-03-02 DIAGNOSIS — S06350A Traumatic hemorrhage of left cerebrum without loss of consciousness, initial encounter: Secondary | ICD-10-CM | POA: Diagnosis not present

## 2015-03-02 DIAGNOSIS — E785 Hyperlipidemia, unspecified: Secondary | ICD-10-CM | POA: Diagnosis present

## 2015-03-02 DIAGNOSIS — E669 Obesity, unspecified: Secondary | ICD-10-CM | POA: Diagnosis present

## 2015-03-02 DIAGNOSIS — R519 Headache, unspecified: Secondary | ICD-10-CM

## 2015-03-02 DIAGNOSIS — I10 Essential (primary) hypertension: Secondary | ICD-10-CM | POA: Diagnosis present

## 2015-03-02 DIAGNOSIS — C3490 Malignant neoplasm of unspecified part of unspecified bronchus or lung: Secondary | ICD-10-CM

## 2015-03-02 DIAGNOSIS — C3411 Malignant neoplasm of upper lobe, right bronchus or lung: Secondary | ICD-10-CM | POA: Diagnosis present

## 2015-03-02 DIAGNOSIS — C7931 Secondary malignant neoplasm of brain: Principal | ICD-10-CM | POA: Diagnosis present

## 2015-03-02 DIAGNOSIS — Z86718 Personal history of other venous thrombosis and embolism: Secondary | ICD-10-CM

## 2015-03-02 DIAGNOSIS — G4730 Sleep apnea, unspecified: Secondary | ICD-10-CM | POA: Diagnosis present

## 2015-03-02 DIAGNOSIS — J449 Chronic obstructive pulmonary disease, unspecified: Secondary | ICD-10-CM | POA: Diagnosis present

## 2015-03-02 DIAGNOSIS — I509 Heart failure, unspecified: Secondary | ICD-10-CM | POA: Diagnosis present

## 2015-03-02 DIAGNOSIS — Z87891 Personal history of nicotine dependence: Secondary | ICD-10-CM

## 2015-03-02 DIAGNOSIS — S06360A Traumatic hemorrhage of cerebrum, unspecified, without loss of consciousness, initial encounter: Secondary | ICD-10-CM

## 2015-03-02 DIAGNOSIS — S0633AA Contusion and laceration of cerebrum, unspecified, with loss of consciousness status unknown, initial encounter: Secondary | ICD-10-CM

## 2015-03-02 LAB — CBC WITH DIFFERENTIAL/PLATELET
BASOS ABS: 0 10*3/uL (ref 0.0–0.1)
BASOS PCT: 0 % (ref 0–1)
EOS ABS: 0.1 10*3/uL (ref 0.0–0.7)
Eosinophils Relative: 2 % (ref 0–5)
HCT: 36.4 % — ABNORMAL LOW (ref 39.0–52.0)
Hemoglobin: 11.8 g/dL — ABNORMAL LOW (ref 13.0–17.0)
Lymphocytes Relative: 10 % — ABNORMAL LOW (ref 12–46)
Lymphs Abs: 0.8 10*3/uL (ref 0.7–4.0)
MCH: 31.6 pg (ref 26.0–34.0)
MCHC: 32.4 g/dL (ref 30.0–36.0)
MCV: 97.3 fL (ref 78.0–100.0)
MONO ABS: 0.6 10*3/uL (ref 0.1–1.0)
Monocytes Relative: 8 % (ref 3–12)
NEUTROS ABS: 6.3 10*3/uL (ref 1.7–7.7)
Neutrophils Relative %: 80 % — ABNORMAL HIGH (ref 43–77)
Platelets: 165 10*3/uL (ref 150–400)
RBC: 3.74 MIL/uL — AB (ref 4.22–5.81)
RDW: 13.6 % (ref 11.5–15.5)
WBC: 7.8 10*3/uL (ref 4.0–10.5)

## 2015-03-02 LAB — COMPREHENSIVE METABOLIC PANEL
ALBUMIN: 3.9 g/dL (ref 3.5–5.0)
ALT: 15 U/L — AB (ref 17–63)
AST: 23 U/L (ref 15–41)
Alkaline Phosphatase: 67 U/L (ref 38–126)
Anion gap: 11 (ref 5–15)
BUN: 12 mg/dL (ref 6–20)
CALCIUM: 8.8 mg/dL — AB (ref 8.9–10.3)
CO2: 27 mmol/L (ref 22–32)
Chloride: 102 mmol/L (ref 101–111)
Creatinine, Ser: 0.83 mg/dL (ref 0.61–1.24)
GFR calc Af Amer: >60 mL/min (ref >60)
GFR calc non Af Amer: >60 mL/min (ref >60)
GLUCOSE: 101 mg/dL — AB (ref 65–99)
POTASSIUM: 4.4 mmol/L (ref 3.5–5.1)
SODIUM: 140 mmol/L (ref 135–145)
Total Bilirubin: 0.5 mg/dL (ref 0.3–1.2)
Total Protein: 6.8 g/dL (ref 6.5–8.1)

## 2015-03-02 LAB — PROTIME-INR
INR: 1.5 — ABNORMAL HIGH (ref 0.00–1.49)
INR: 1.13 (ref 0.00–1.49)
PROTHROMBIN TIME: 14.7 s (ref 11.6–15.2)
Prothrombin Time: 18.2 seconds — ABNORMAL HIGH (ref 11.6–15.2)

## 2015-03-02 LAB — MRSA PCR SCREENING: MRSA by PCR: NEGATIVE

## 2015-03-02 MED ORDER — STROKE: EARLY STAGES OF RECOVERY BOOK
Freq: Once | Status: AC
  Administered 2015-03-02: 22:00:00
  Filled 2015-03-02: qty 1

## 2015-03-02 MED ORDER — PANTOPRAZOLE SODIUM 40 MG IV SOLR
40.0000 mg | Freq: Every day | INTRAVENOUS | Status: DC
  Administered 2015-03-02 – 2015-03-03 (×2): 40 mg via INTRAVENOUS
  Filled 2015-03-02 (×3): qty 40

## 2015-03-02 MED ORDER — SENNOSIDES-DOCUSATE SODIUM 8.6-50 MG PO TABS
1.0000 | ORAL_TABLET | Freq: Two times a day (BID) | ORAL | Status: DC
  Administered 2015-03-02 – 2015-03-05 (×6): 1 via ORAL
  Filled 2015-03-02 (×7): qty 1

## 2015-03-02 MED ORDER — VITAMIN K1 10 MG/ML IJ SOLN
10.0000 mg | INTRAVENOUS | Status: AC
  Administered 2015-03-02: 10 mg via INTRAVENOUS
  Filled 2015-03-02: qty 1

## 2015-03-02 MED ORDER — TRAMADOL HCL 50 MG PO TABS
50.0000 mg | ORAL_TABLET | Freq: Four times a day (QID) | ORAL | Status: DC | PRN
Start: 2015-03-02 — End: 2015-03-05
  Administered 2015-03-03: 50 mg via ORAL
  Filled 2015-03-02: qty 1

## 2015-03-02 MED ORDER — ACETAMINOPHEN 650 MG RE SUPP: 650.0000 mg | RECTAL | Status: DC | PRN

## 2015-03-02 MED ORDER — LABETALOL HCL 5 MG/ML IV SOLN: 10.0000 mg | INTRAVENOUS | Status: DC | PRN

## 2015-03-02 MED ORDER — SODIUM CHLORIDE 0.9 % IV SOLN
INTRAVENOUS | Status: DC
  Administered 2015-03-02 – 2015-03-03 (×2): via INTRAVENOUS

## 2015-03-02 MED ORDER — ACETAMINOPHEN 325 MG PO TABS: 650.0000 mg | ORAL_TABLET | ORAL | Status: DC | PRN

## 2015-03-02 MED ORDER — PROTHROMBIN COMPLEX CONC HUMAN 500 UNITS IV KIT
2500.0000 [IU] | PACK | Status: AC
  Administered 2015-03-02: 2500 [IU] via INTRAVENOUS
  Filled 2015-03-02: qty 100

## 2015-03-02 NOTE — Progress Notes (Signed)
eLink Physician-Brief Progress Note Patient Name: Raymond Chaney DOB: October 27, 1931 MRN: [id number]   Date of Service  03/02/2015  HPI/Events of Note  New patient Small intracerebral hemorrhage Stable on camera check  eICU Interventions  Care per neurology No eICU intervention     Intervention Category Evaluation Type: New Patient Evaluation  Raymond Chaney 03/02/2015, 8:12 PM

## 2015-03-02 NOTE — ED Provider Notes (Signed)
CSN: [id number]     Arrival date & time 03/02/15  1516 History   First MD Initiated Contact with Patient 03/02/15 1527     Chief Complaint  Patient presents with  . Blood on brain      (Consider location/radiation/quality/duration/timing/severity/associated sxs/prior Treatment) The history is provided by the patient and medical records. No language interpreter was used.     Raymond Chaney is a 81 y.o. male  with a hx of lung cancer (radiation therapy), arthritis, CHF, COPD, HTN, CVA, EtOH abuse, DVT, CAD, NIDDM, anticoagulation presents to the Emergency Department complaining of dull persistent, mild headache onset 10 days ago.  Pt reports this is significantly worsened with coughing, straining or shaking his head.  Pt reports he saw his PCP this AM and was sent for a CT.  He was called later and told that he had "blood on the brain" and that he needed to come to the ED.  Pt reports taking his coumadin as directed.  Pt reports SOB with exertion since the diagnosis of his cancer and has had increased weakness since beginning the radiation.  Pt reports associated intermittent vision changes, but he cannot remember when this began.  Pt denies fever, chills, neck pain, neck stiffness, CP, abd pain, N/V/D, rash, slurred speech, numbness, tingling, gait disturbance, seizures, fall.       Past Medical History  Diagnosis Date  . Arthritis   . CHF (congestive heart failure)   . COPD (chronic obstructive pulmonary disease)   . Hypertension   . Stroke   . Gout   . Alcohol abuse   . GIB (gastrointestinal bleeding)   . Complication of anesthesia     2004   stopped breathing  3 times  on the table "  . Shortness of breath   . Sleep apnea 09/08/2012    sleep study 08/20/12-ild sleep apnea with an AHI of 7.8/hr and durimg REM 27.2/hr: CPAP 10/04/12-tried nasal pillows and face mask, but couldnt tolerate  . Atrial fibrillation     chronic on coumadin; monitor 05/20/11- AFib    . DVT, lower  extremity      2005  . Hyperlipidemia   . CAD (coronary artery disease)     myoview 10/19/09-diaphragmatic attenuation vs inferior scar without ischemia; echo 09/11/12-Ef 40-45%, L atrium mod to severely dilated  . Radiation 10/18/14-10/28/14    SBRT right lateral lung mass 50 gray  . Diabetes mellitus without complication    Past Surgical History  Procedure Laterality Date  . Total knee arthroplasty    . Colon surgery      colon polyps  . Hip surgery    . 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 Cardiac catheterization  10/11/02    noncritical CAD, EF 45-50%  . Cardiac catheterization  08/22/99    no significant CAD, nl EF   Family History  Problem Relation Age of Onset  . Heart attack Father   . Diabetes Father   . Dementia Mother   . Cancer Sister   . Cancer Brother   . Heart disease Brother   . Cancer Brother   . Cancer Brother   . Cancer Sister    History  Substance Use Topics  . Smoking status: Former Smoker    Quit date: 09/08/1984  . Smokeless tobacco: Former Systems developer  . Alcohol Use: No     Comment: HX of Alcohol abuse    Quit  in 2004    Review of Systems  Constitutional: Negative for fever, diaphoresis, appetite change, fatigue and unexpected weight change.  HENT: Negative for mouth sores.   Eyes: Positive for visual disturbance.  Respiratory: Negative for cough, chest tightness, shortness of breath and wheezing.   Cardiovascular: Negative for chest pain.  Gastrointestinal: Negative for nausea, vomiting, abdominal pain, diarrhea and constipation.  Endocrine: Negative for polydipsia, polyphagia and polyuria.  Genitourinary: Negative for dysuria, urgency, frequency and hematuria.  Musculoskeletal: Negative for back pain and neck stiffness.  Skin: Negative for rash.  Allergic/Immunologic: Negative for immunocompromised state.  Neurological: Positive for headaches. Negative for syncope and light-headedness.   Hematological: Does not bruise/bleed easily.  Psychiatric/Behavioral: Negative for sleep disturbance. The patient is not nervous/anxious.       Allergies  Penicillins  Home Medications   Prior to Admission medications   Medication Sig Start Date End Date Taking? Authorizing Provider  allopurinol (ZYLOPRIM) 100 MG tablet Take 100 mg by mouth daily.   Yes Historical Provider, MD  Ascorbic Acid (VITAMIN C) 1000 MG tablet Take 1,000 mg by mouth daily.   Yes Historical Provider, MD  doxazosin (CARDURA) 4 MG tablet Take 4 mg by mouth daily.   Yes Historical Provider, MD  fluticasone (FLONASE) 50 MCG/ACT nasal spray Place 2 sprays into the nose daily as needed for allergies.  09/13/14  Yes Historical Provider, MD  lisinopril (PRINIVIL,ZESTRIL) 10 MG tablet Take 10 mg by mouth daily.   Yes Historical Provider, MD  metFORMIN (GLUCOPHAGE) 1000 MG tablet Take 1,000 mg by mouth daily with breakfast.   Yes Historical Provider, MD  metoprolol tartrate (LOPRESSOR) 50 MG tablet Take 1 tablet (50 mg total) by mouth daily. 09/12/12  Yes Thurnell Lose, MD  mirabegron ER (MYRBETRIQ) 50 MG TB24 tablet Take 50 mg by mouth daily.   Yes Historical Provider, MD  pravastatin (PRAVACHOL) 80 MG tablet Take 80 mg by mouth daily.   Yes Historical Provider, MD  warfarin (COUMADIN) 5 MG tablet Take 1 tablet by mouth daily or as directed Patient taking differently: Take 2.5-5 mg by mouth daily. Take 1/2 tablet (2.'5mg'$ ) MON & FRI. Take 1 ('5mg'$ ) tablet TUEWEDTHURSSATSUN 06/16/14  Yes Kristin L Alvstad, RPH-CPP   BP 130/66 mmHg  Pulse 76  Temp(Src) 97.8 F (36.6 C) (Oral)  Resp 31  Ht 6' (1.829 m)  Wt 255 lb 8.2 oz (115.9 kg)  BMI 34.65 kg/m2  SpO2 93% Physical Exam  Constitutional: He is oriented to person, place, and time. He appears well-developed and well-nourished. No distress.  HENT:  Head: Normocephalic and atraumatic.  Right Ear: Tympanic membrane, external ear and ear canal normal.  Left Ear: Tympanic  membrane, external ear and ear canal normal.  Nose: Nose normal. No epistaxis. Right sinus exhibits no maxillary sinus tenderness and no frontal sinus tenderness. Left sinus exhibits no maxillary sinus tenderness and no frontal sinus tenderness.  Mouth/Throat: Uvula is midline, oropharynx is clear and moist and mucous membranes are normal. Mucous membranes are not pale and not cyanotic. No oropharyngeal exudate, posterior oropharyngeal edema, posterior oropharyngeal erythema or tonsillar abscesses.  Eyes: Conjunctivae and EOM are normal. Pupils are equal, round, and reactive to light. No scleral icterus.  No horizontal, vertical or rotational nystagmus Constricted pupils, equal  Neck: Normal range of motion and full passive range of motion without pain. Neck supple.  Full active and passive ROM without pain No midline or paraspinal tenderness No nuchal rigidity or meningeal signs  Cardiovascular: Normal rate,  regular rhythm, normal heart sounds and intact distal pulses.   No murmur heard. Pulmonary/Chest: Effort normal and breath sounds normal. No stridor. No respiratory distress. He has no wheezes. He has no rales.  Clear and equal breath sounds without focal wheezes, rhonchi, rales  Abdominal: Soft. Bowel sounds are normal. There is no tenderness. There is no rebound and no guarding.  Musculoskeletal: Normal range of motion.  Lymphadenopathy:    He has no cervical adenopathy.  Neurological: He is alert and oriented to person, place, and time. He has normal reflexes. No cranial nerve deficit. He exhibits normal muscle tone. Coordination normal.  Mental Status:  Alert, oriented, thought content appropriate. Speech fluent without evidence of aphasia. Able to follow 2 step commands without difficulty.  Cranial Nerves:  II:  Peripheral visual fields grossly normal, pupils equal, round, reactive to light III,IV, VI: ptosis not present, extra-ocular motions intact bilaterally  V,VII: smile  symmetric, facial light touch sensation equal VIII: hearing grossly normal bilaterally  IX,X: gag reflex present  XI: bilateral shoulder shrug equal and strong XII: midline tongue extension  Motor:  5/5 in upper and lower extremities bilaterally including strong and equal grip strength and dorsiflexion/plantar flexion Sensory: Pinprick and light touch normal in all extremities.  Deep Tendon Reflexes: 2+ and symmetric  Cerebellar: normal finger-to-nose with bilateral upper extremities Gait: normal gait and balance CV: distal pulses palpable throughout   Skin: Skin is warm and dry. No rash noted. He is not diaphoretic.  Psychiatric: He has a normal mood and affect. His behavior is normal. Judgment and thought content normal.  Nursing note and vitals reviewed.   ED Course  Procedures (including critical care time) Labs Review Labs Reviewed  CBC WITH DIFFERENTIAL/PLATELET - Abnormal; Notable for the following:    RBC 3.74 (*)    Hemoglobin 11.8 (*)    HCT 36.4 (*)    Neutrophils Relative % 80 (*)    Lymphocytes Relative 10 (*)    All other components within normal limits  COMPREHENSIVE METABOLIC PANEL - Abnormal; Notable for the following:    Glucose, Bld 101 (*)    Calcium 8.8 (*)    ALT 15 (*)    All other components within normal limits  PROTIME-INR - Abnormal; Notable for the following:    Prothrombin Time 18.2 (*)    INR 1.50 (*)    All other components within normal limits  MRSA PCR SCREENING  PROTIME-INR  PROTIME-INR  PROTIME-INR  PROTIME-INR  PROTIME-INR    Imaging Review Ct Head Wo Contrast  03/02/2015   CLINICAL DATA:  Frontal headache for 14 days. History of lung cancer.  EXAM: CT HEAD WITHOUT CONTRAST  TECHNIQUE: Contiguous axial images were obtained from the base of the skull through the vertex without intravenous contrast.  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 No evidence of osseous metastatic disease.   Orbits: No acute abnormality.  Brain: 18 mm hematoma in the left parietal white matter around the lateral ventricle. There is a fluid hematocrit levels suggesting intracystic hemorrhage. Small volume hemorrhage present in the occipital horn of the left lateral ventricle which is not obstructed. There is neighboring vasogenic edema. No additional mass lesion is seen.  Remote right superior cerebellum infarction with gliosis.  Generalized brain atrophy.  Critical Value/emergent results were called by telephone at the time of interpretation on 03/02/2015 at 2:20 pm to Dr. Prince Solian , who verbally acknowledged these results. The patient will be sent  [facility]up [facility] ED.  IMPRESSION: 19 mm hematoma in the left parietal white matter with small intraventricular extension. Recommend updated brain MRI with contrast as this is primarily concerning for new metastatic disease.   Electronically Signed   By: Monte Fantasia M.D.   On: 03/02/2015 14:31     EKG Interpretation None       CRITICAL CARE Performed by: Abigail Butts Total critical care time: 68mn Critical care time was exclusive of separately billable procedures and treating other patients. Critical care was necessary to treat or prevent imminent or life-threatening deterioration. Critical care was time spent personally by me on the following activities: development of treatment plan with patient and/or surrogate as well as nursing, discussions with consultants, evaluation of patient's response to treatment, examination of patient, obtaining history from patient or surrogate, ordering and performing treatments and interventions, ordering and review of laboratory studies, ordering and review of radiographic studies, pulse oximetry and re-evaluation of patient's condition.   MDM   Final diagnoses:  Intraparenchymal hematoma of brain, left, without loss of consciousness, initial encounter   AShon Houghpresents from PCP  after outpatient CT scan showed 182mhematoma in the left parietal white matter with small intraventricular extension.  Will consult with neurology for admission.  Labs, MRI pending.  No focal neurologic deficits on my exam.  VSS.  Pt is A&Ox4.    5:14 PM [facility] Dr[facility] GoRegenia Skeeter Will reverse coumadin.  INR is 1.5 today.  [facility]   The patient was discussed with and seen by Dr. GoRegenia Skeeterho agrees with the treatment plan.  BP 130/66 mmHg  Pulse 76  Temp(Src) 97.8 F (36.6 C) (Oral)  Resp 31  Ht 6' (1.829 m)  Wt 255 lb 8.2 oz (115.9 kg)  BMI 34.65 kg/m2  SpO2 93%    HaCDW CorporationPA-C 03/02/15 2355  ScSherwood GamblerMD 03/07/15 1441

## 2015-03-02 NOTE — ED Notes (Signed)
Bed: JX53 Expected date:  Expected time:  Means of arrival:  Comments: triage

## 2015-03-02 NOTE — ED Notes (Signed)
Pt reports he is currently being treated with radiation for lung cancer, pt reports head pain when coughing and turning head x10 days. Pt went to pcp today and had head CT. Pt was called and told to come to ED due to "blood on his brain".

## 2015-03-02 NOTE — H&P (Signed)
Admission H&P    Chief Complaint: Headache for 10 days and abnormal CT with intracerebral hemorrhage.  HPI: Raymond Chaney is an 81 y.o. male with a history of lung cancer and radiation therapy, COPD, CHF, stroke age 54, hypertension, atrial fibrillation on anticoagulation and diabetes mellitus, presenting with headache for 10 days. He had a CT scan ordered by his physician which showed an acute left parietal area of hemorrhage injuring 18 mm, with significant surrounding edema. There was slight extension into left lateral ventricle. Patient is experienced vague visual symptoms but no frank field defect. He is experienced a headache with repeat coughs or strains. He has known differentiated adenocarcinoma of the lung. He was on Coumadin and INR was 1.5. He has undergone reversal protocol. His NIH stroke score was 0, including normal visual fields  Past Medical History  Diagnosis Date  . Arthritis   . CHF (congestive heart failure)   . COPD (chronic obstructive pulmonary disease)   . Hypertension   . Stroke   . Gout   . Alcohol abuse   . GIB (gastrointestinal bleeding)   . Complication of anesthesia     2004   stopped breathing  3 times  on the table "  . Shortness of breath   . Sleep apnea 09/08/2012    sleep study 08/20/12-ild sleep apnea with an AHI of 7.8/hr and durimg REM 27.2/hr: CPAP 10/04/12-tried nasal pillows and face mask, but couldnt tolerate  . Atrial fibrillation     chronic on coumadin; monitor 05/20/11- AFib    . DVT, lower extremity      2005  . Hyperlipidemia   . CAD (coronary artery disease)     myoview 10/19/09-diaphragmatic attenuation vs inferior scar without ischemia; echo 09/11/12-Ef 40-45%, L atrium mod to severely dilated  . Radiation 10/18/14-10/28/14    SBRT right lateral lung mass 50 gray  . Diabetes mellitus without complication     Past Surgical History  Procedure Laterality Date  . Total knee arthroplasty    . Colon surgery      colon polyps  . Hip  surgery    . 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 Cardiac catheterization  10/11/02    noncritical CAD, EF 45-50%  . Cardiac catheterization  08/22/99    no significant CAD, nl EF    Family History  Problem Relation Age of Onset  . Heart attack Father   . Diabetes Father   . Dementia Mother   . Cancer Sister   . Cancer Brother   . Heart disease Brother   . Cancer Brother   . Cancer Brother   . Cancer Sister    Social History:  reports that he quit smoking about 30 years ago. He has quit using smokeless tobacco. He reports that he does not drink alcohol or use illicit drugs.  Allergies:  Allergies  Allergen Reactions  . Penicillins Rash    Medications Prior to Admission  Medication Sig Dispense Refill  . allopurinol (ZYLOPRIM) 100 MG tablet Take 100 mg by mouth daily.    . Ascorbic Acid (VITAMIN C) 1000 MG tablet Take 1,000 mg by mouth daily.    Marland Kitchen doxazosin (CARDURA) 4 MG tablet Take 4 mg by mouth daily.    . fluticasone (FLONASE) 50 MCG/ACT nasal spray Place 2 sprays into the nose daily as needed for allergies.   0  . lisinopril (PRINIVIL,ZESTRIL) 10 MG tablet Take 10  mg by mouth daily.    . metFORMIN (GLUCOPHAGE) 1000 MG tablet Take 1,000 mg by mouth daily with breakfast.    . metoprolol tartrate (LOPRESSOR) 50 MG tablet Take 1 tablet (50 mg total) by mouth daily. 60 tablet 0  . mirabegron ER (MYRBETRIQ) 50 MG TB24 tablet Take 50 mg by mouth daily.    . pravastatin (PRAVACHOL) 80 MG tablet Take 80 mg by mouth daily.    Marland Kitchen warfarin (COUMADIN) 5 MG tablet Take 1 tablet by mouth daily or as directed (Patient taking differently: Take 2.5-5 mg by mouth daily. Take 1/2 tablet (2.61m) MON & FRI. Take 1 (514m tablet TUEWEDTHURSSATSUN) 90 tablet 3    ROS: History obtained from chart review and the patient  General ROS: negative for - chills, fatigue, fever, night sweats, weight gain or weight loss Psychological ROS:  negative for - behavioral disorder, hallucinations, memory difficulties, mood swings or suicidal ideation Ophthalmic ROS: Vague visual changes as noted in history of present illness ENT ROS: negative for - epistaxis, nasal discharge, oral lesions, sore throat, tinnitus or vertigo Allergy and Immunology ROS: negative for - hives or itchy/watery eyes Hematological and Lymphatic ROS: negative for - bleeding problems, bruising or swollen lymph nodes Endocrine ROS: negative for - galactorrhea, hair pattern changes, polydipsia/polyuria or temperature intolerance Respiratory ROS: Adenocarcinoma long as noted in history of present illness Cardiovascular ROS: negative for - chest pain, dyspnea on exertion, edema or irregular heartbeat Gastrointestinal ROS: negative for - abdominal pain, diarrhea, hematemesis, nausea/vomiting or stool incontinence Genito-Urinary ROS: negative for - dysuria, hematuria, incontinence or urinary frequency/urgency Musculoskeletal ROS: negative for - joint swelling or muscular weakness Neurological ROS: as noted in HPI Dermatological ROS: negative for rash and skin lesion changes  Physical Examination: Blood pressure 136/68, pulse 70, temperature 97.8 F (36.6 C), temperature source Oral, resp. rate 16, height 6' (1.829 m), weight 115.9 kg (255 lb 8.2 oz), SpO2 97 %.  HEENT-  Normocephalic, no lesions, without obvious abnormality.  Normal external eye and conjunctiva.  Normal TM's bilaterally.  Normal auditory canals and external ears. Normal external nose, mucus membranes and septum.  Normal pharynx. Neck supple with no masses, nodes, nodules or enlargement. Cardiovascular - regular rate and rhythm, S1, S2 normal, no murmur, click, rub or gallop Lungs - chest clear, no wheezing, rales, normal symmetric air entry Abdomen - soft, non-tender; bowel sounds normal; no masses,  no organomegaly Extremities - no joint deformities, effusion, or inflammation, no edema and no skin  discoloration  Neurologic Examination: Mental Status: Alert, oriented, thought content appropriate.  Speech fluent without evidence of aphasia. Able to follow commands without difficulty. Cranial Nerves: II-Visual fields were normal. III/IV/VI-Pupils were small, equal and reacted normally to light. Extraocular movements were full and conjugate.    V/VII-no facial numbness and no facial weakness. VIII-normal. X-normal speech and symmetrical palatal movement. XI: trapezius strength/neck flexion strength normal bilaterally XII-midline tongue extension with normal strength. Motor: 5/5 bilaterally with normal tone and bulk Sensory: Normal throughout. Deep Tendon Reflexes: Trace to 1+ in upper extremities and absent in lower extremities and symmetric. Plantars: Mute bilaterally Cerebellar: Normal finger-to-nose testing. Carotid auscultation: Normal  Results for orders placed or performed during the hospital encounter of 03/02/15 (from the past 48 hour(s))  CBC with Differential/Platelet     Status: Abnormal   Collection Time: 03/02/15  4:25 PM  Result Value Ref Range   WBC 7.8 4.0 - 10.5 K/uL   RBC 3.74 (L) 4.22 - 5.81 MIL/uL  Hemoglobin 11.8 (L) 13.0 - 17.0 g/dL   HCT 36.4 (L) 39.0 - 52.0 %   MCV 97.3 78.0 - 100.0 fL   MCH 31.6 26.0 - 34.0 pg   MCHC 32.4 30.0 - 36.0 g/dL   RDW 13.6 11.5 - 15.5 %   Platelets 165 150 - 400 K/uL   Neutrophils Relative % 80 (H) 43 - 77 %   Neutro Abs 6.3 1.7 - 7.7 K/uL   Lymphocytes Relative 10 (L) 12 - 46 %   Lymphs Abs 0.8 0.7 - 4.0 K/uL   Monocytes Relative 8 3 - 12 %   Monocytes Absolute 0.6 0.1 - 1.0 K/uL   Eosinophils Relative 2 0 - 5 %   Eosinophils Absolute 0.1 0.0 - 0.7 K/uL   Basophils Relative 0 0 - 1 %   Basophils Absolute 0.0 0.0 - 0.1 K/uL  Comprehensive metabolic panel     Status: Abnormal   Collection Time: 03/02/15  4:25 PM  Result Value Ref Range   Sodium 140 135 - 145 mmol/L   Potassium 4.4 3.5 - 5.1 mmol/L   Chloride 102  101 - 111 mmol/L   CO2 27 22 - 32 mmol/L   Glucose, Bld 101 (H) 65 - 99 mg/dL   BUN 12 6 - 20 mg/dL   Creatinine, Ser 0.83 0.61 - 1.24 mg/dL   Calcium 8.8 (L) 8.9 - 10.3 mg/dL   Total Protein 6.8 6.5 - 8.1 g/dL   Albumin 3.9 3.5 - 5.0 g/dL   AST 23 15 - 41 U/L   ALT 15 (L) 17 - 63 U/L   Alkaline Phosphatase 67 38 - 126 U/L   Total Bilirubin 0.5 0.3 - 1.2 mg/dL   GFR calc non Af Amer >60 >60 mL/min   GFR calc Af Amer >60 >60 mL/min    Comment: (NOTE) The eGFR has been calculated using the CKD EPI equation. This calculation has not been validated in all clinical situations. eGFR's persistently <60 mL/min signify possible Chronic Kidney Disease.    Anion gap 11 5 - 15  Protime-INR     Status: Abnormal   Collection Time: 03/02/15  4:25 PM  Result Value Ref Range   Prothrombin Time 18.2 (H) 11.6 - 15.2 seconds   INR 1.50 (H) 0.00 - 1.49   Ct Head Wo Contrast  03/02/2015   CLINICAL DATA:  Frontal headache for 14 days. History of lung cancer.  EXAM: CT HEAD WITHOUT CONTRAST  TECHNIQUE: Contiguous axial images were obtained from the base of the skull through the vertex without intravenous contrast.  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 No evidence of osseous metastatic disease.  Orbits: No acute abnormality.  Brain: 18 mm hematoma in the left parietal white matter around the lateral ventricle. There is a fluid hematocrit levels suggesting intracystic hemorrhage. Small volume hemorrhage present in the occipital horn of the left lateral ventricle which is not obstructed. There is neighboring vasogenic edema. No additional mass lesion is seen.  Remote right superior cerebellum infarction with gliosis.  Generalized brain atrophy.  Critical Value/emergent results were called by telephone at the time of interpretation on 03/02/2015 at 2:20 pm to Dr. Prince Solian , who verbally acknowledged these results. The patient will be sent for workup at  [facility] ED.  IMPRESSION: 19 mm hematoma in the left parietal white matter with small intraventricular extension. Recommend updated brain MRI with contrast as this is primarily concerning for new metastatic disease.  Electronically Signed   By: Monte Fantasia M.D.   On: 03/02/2015 14:31    Assessment/Plan 81 year old man with sternal atrial fibrillation as well as history of previous stroke, and known adenocarcinoma of the lung resulting with left parietal hemorrhage with surrounding edema. Hemorrhagic stroke cannot be ruled out. However, given the amount of edema present which appears to be vasogenic, hemorrhagic metastatic aplastic lesion is more likely.  Recommendations: 1. MRI of the brain without and with contrast. 2. Stroke workup with risk assessment if MRI findings are consistent with neurologic stroke rather than MRI treatment metastatic lesion. 3. Decadron cerebral edema if brain lesion appears to be prostatic tumor. 4. Oncology consult for possible metastatic brain lesion as well as ongoing management of agents lung cancer.  We'll continue to follow this patient with you.  C.R. Nicole Kindred, MD [facility]  03/02/2015, 8:05 PM

## 2015-03-03 ENCOUNTER — Inpatient Hospital Stay (HOSPITAL_COMMUNITY): Payer: Medicare Other

## 2015-03-03 ENCOUNTER — Encounter (HOSPITAL_COMMUNITY): Payer: Self-pay | Admitting: *Deleted

## 2015-03-03 DIAGNOSIS — G936 Cerebral edema: Secondary | ICD-10-CM | POA: Insufficient documentation

## 2015-03-03 LAB — PROTIME-INR
INR: 1.2 (ref 0.00–1.49)
INR: 1.23 (ref 0.00–1.49)
Prothrombin Time: 15.4 seconds — ABNORMAL HIGH (ref 11.6–15.2)
Prothrombin Time: 15.6 seconds — ABNORMAL HIGH (ref 11.6–15.2)

## 2015-03-03 MED ORDER — GADOBENATE DIMEGLUMINE 529 MG/ML IV SOLN
20.0000 mL | Freq: Once | INTRAVENOUS | Status: AC | PRN
  Administered 2015-03-03: 20 mL via INTRAVENOUS

## 2015-03-03 NOTE — Progress Notes (Signed)
STROKE TEAM PROGRESS NOTE   HISTORY Raymond Chaney is an 81 y.o. male with a history of lung cancer and radiation therapy, COPD, CHF, stroke age 53, hypertension, atrial fibrillation on anticoagulation and diabetes mellitus, presenting with headache for 10 days. He had a CT scan ordered by his physician which showed an acute left parietal area of hemorrhage injuring 18 mm, with significant surrounding edema. There was slight extension into left lateral ventricle. Patient is experienced vague visual symptoms but no frank field defect. He is experienced a headache with repeat coughs or strains. He has known differentiated adenocarcinoma of the lung. He was on Coumadin and INR was 1.5. He has undergone reversal protocol. His NIH stroke score was 0, including normal visual fields   SUBJECTIVE (INTERVAL HISTORY) His wife is at the bedside.  Overall he feels his condition is unchanged. He states his and headache for nearly 2 weeks but denies any focal neurological symptoms. He had an MRI scan of the brain done in February 2016 as part  of cancer staging and it was unremarkable   OBJECTIVE Temp:  [97.8 F (36.6 C)-98.5 F (36.9 C)] 98.3 F (36.8 C) (06/24 0400) Pulse Rate:  [70-90] 83 (06/24 0700) Cardiac Rhythm:  [-] Atrial fibrillation (06/23 2000) Resp:  [15-35] 18 (06/24 0700) BP: (94-139)/(42-82) 120/52 mmHg (06/24 0700) SpO2:  [87 %-100 %] 95 % (06/24 0700) Weight:  [115.9 kg (255 lb 8.2 oz)-118.3 kg (260 lb 12.9 oz)] 115.9 kg (255 lb 8.2 oz) (06/23 1828)  No results for input(s): GLUCAP in the last 168 hours.  Recent Labs Lab 03/02/15 1625  NA 140  K 4.4  CL 102  CO2 27  GLUCOSE 101*  BUN 12  CREATININE 0.83  CALCIUM 8.8*    Recent Labs Lab 03/02/15 1625  AST 23  ALT 15*  ALKPHOS 67  BILITOT 0.5  PROT 6.8  ALBUMIN 3.9    Recent Labs Lab 03/02/15 1625  WBC 7.8  NEUTROABS 6.3  HGB 11.8*  HCT 36.4*  MCV 97.3  PLT 165   No results for input(s): CKTOTAL,  CKMB, CKMBINDEX, TROPONINI in the last 168 hours.  Recent Labs  03/02/15 1625 03/02/15 2009 03/03/15 0251  LABPROT 18.2* 14.7 15.4*  INR 1.50* 1.13 1.20   No results for input(s): COLORURINE, LABSPEC, PHURINE, GLUCOSEU, HGBUR, BILIRUBINUR, KETONESUR, PROTEINUR, UROBILINOGEN, NITRITE, LEUKOCYTESUR in the last 72 hours.  Invalid input(s): APPERANCEUR  No results found for: CHOL, TRIG, HDL, CHOLHDL, VLDL, LDLCALC No results found for: HGBA1C No results found for: LABOPIA, COCAINSCRNUR, LABBENZ, AMPHETMU, THCU, LABBARB  No results for input(s): ETH in the last 168 hours.   Imaging   Ct Head Wo Contrast 03/02/2015    19 mm hematoma in the left parietal white matter with small intraventricular extension. Recommend updated brain MRI with contrast as this is primarily concerning for new metastatic disease.      MRI Brain with Contrast - Pending      PHYSICAL EXAM Pleasant elderly Caucasian male currently not in distress. . Afebrile. Head is nontraumatic. Neck is supple without bruit.    Cardiac exam no murmur or gallop. Lungs are clear to auscultation. Distal pulses are well felt. Neurological Exam ;  Awake  Alert oriented x 3. Normal speech and language.eye movements full without nystagmus.fundi were not visualized. Vision acuity and fields appear normal. Hearing is normal. Palatal movements are normal. Face symmetric. Tongue midline. Normal strength, tone, reflexes and coordination. Normal sensation. Gait deferred. ASSESSMENT/PLAN Mr. Raymond Chaney  is an 81 y.o. male with history of lung cancer and radiation therapy, COPD, CHF, stroke age 34, hypertension, atrial fibrillation on anticoagulation and diabetes mellitus, presenting with headache for 10 days. He did not receive IV t-PA due to possible hemorrhage.    Small left parietal subcortical hemorrhage with cytotoxic cerebral edema. Etiology indeterminate at the present time but possibly metastasis versus warfarin  related   Resultant  mild headache but no focal deficits   MRI  pending  MRA  not performed  Carotid Doppler  not performed  2D Echo  not performed  LDL not performed  HgbA1c not performed  SCDs for VTE prophylaxis  Diet Heart Room service appropriate?: Yes; Fluid consistency:: Thin  warfarin prior to admission, now on no antithrombotic  Ongoing aggressive stroke risk factor management  Therapy recommendations: Pending  Disposition: Pending  Hypertension  Home meds: Cardura, lisinopril, and metoprolol  Stable    Hyperlipidemia  Home meds: Pravachol not resumed in hospital  LDL not performed, goal < 70  Continue statin at discharge  Diabetes  HgbA1c not performed, goal < 7.0  Controlled  Other Stroke Risk Factors  Advanced age  Cigarette smoker, quit smoking 30 years ago.  Obesity, Body mass index is 34.65 kg/(m^2).   Hx stroke/TIA   Other Active Problems  Mild anemia  Atrial fibrillation - off anticoagulation  Possible brain metastasis - MRI pending  Other Pertinent History    Hospital day # 1  I have personally examined this patient, reviewed notes, independently viewed imaging studies, participated in medical decision making and plan of care. I have made any additions or clarifications directly to the above note. He presented with two-week history of migraine headache without focal neurological symptoms. Neurological exam is nonfocal but MRI scan shows a 2 mm left parietal periventricular hemorrhage with significant surrounding cytotoxic edema. Etiology of this is unclear but may represent metastasis versus warfarin related hemorrhage. Patient needs close neurological monitoring, reversal of anticoagulation which has been done and strict blood pressure control. He remains at significant risk for neurological worsening, increase hemorrhage, recurrent strokes. Plan check MRI scan of the brain with and without contrast to look for metastasis  versus underlying lesions. I had a long discussion with the patient and wife and answered questions.  This patient is critically ill and at significant risk of neurological worsening, death and care requires constant monitoring of vital signs, hemodynamics,respiratory and cardiac monitoring, extensive review of multiple databases, frequent neurological assessment, discussion with family, other specialists and medical decision making of high complexity.I have made any additions or clarifications directly to the above note.This critical care time does not reflect procedure time, or teaching time or supervisory time of PA/NP/Med Resident etc but could involve care discussion time.  I spent 30 minutes of neurocritical care time  in the care of  this patient.    Antony Contras, MD Medical Director [facility] Stroke Center Pager: [phone] 03/03/2015 1:14 PM     To contact Stroke Continuity provider, please refer to [email or web address] After hours, contact General Neurology

## 2015-03-03 NOTE — Evaluation (Signed)
Physical Therapy Evaluation Patient Details Name: [name] MRN: [id number] DOB: [birth year] Today's Date: 03/03/2015   History of Present Illness  Raymond Chaney is an 81 y.o. male with a history of lung cancer and radiation therapy, COPD, CHF, stroke age 82, hypertension, atrial fibrillation on anticoagulation and diabetes mellitus, presenting with headache for 10 days. He had a CT scan ordered by his physician which showed an acute left parietal area of hemorrhage injuring 18 mm, with significant surrounding edema. There was slight extension into left lateral ventricle. Patient is experienced vague visual symptoms but no frank field defect.  Clinical Impression  Pt admitted with/for HA with CT showing bleed.  Pt currently limited functionally due to the problems listed below.  (see problems list.)  Pt will benefit from PT to maximize function and safety to be able to get home safely with available assist of family.     Follow Up Recommendations Home health PT    Equipment Recommendations  None recommended by PT    Recommendations for Other Services       Precautions / Restrictions Precautions Precautions: Fall      Mobility  Bed Mobility Overal bed mobility: Needs Assistance Bed Mobility: Supine to Sit     Supine to sit: Supervision        Transfers Overall transfer level: Needs assistance   Transfers: Sit to/from Stand Sit to Stand: Supervision            Ambulation/Gait Ambulation/Gait assistance: Supervision Ambulation Distance (Feet): 160 Feet Assistive device: None (vs IV pole) Gait Pattern/deviations: Step-through pattern Gait velocity: moderate Gait velocity interpretation: at or above normal speed for age/gender General Gait Details: rolling gait to favor R LE mostly per pt because of orthopedic knee pain.  Stairs            Wheelchair Mobility    Modified Rankin (Stroke Patients Only) Modified Rankin (Stroke Patients  Only) Pre-Morbid Rankin Score: No significant disability Modified Rankin: No significant disability     Balance Overall balance assessment: Needs assistance Sitting-balance support: Feet supported;No upper extremity supported Sitting balance-Leahy Scale: Good     Standing balance support: No upper extremity supported Standing balance-Leahy Scale: Fair                               Pertinent Vitals/Pain Pain Assessment: No/denies pain    Home Living Family/patient expects to be discharged to:: Private residence Living Arrangements: Spouse/significant other Available Help at Discharge: Family;Friend(s);Available 24 hours/day Type of Home: House       Home Layout: One level Home Equipment: None      Prior Function                 Hand Dominance   Dominant Hand: Right    Extremity/Trunk Assessment   Upper Extremity Assessment: Defer to OT evaluation           Lower Extremity Assessment: Overall WFL for tasks assessed (R LE mildly weaker than L LE)         Communication      Cognition Arousal/Alertness: Awake/alert Behavior During Therapy: WFL for tasks assessed/performed Overall Cognitive Status: Within Functional Limits for tasks assessed                      General Comments General comments (skin integrity, edema, etc.): vitals stable    Exercises        Assessment/Plan  PT Assessment Patient needs continued PT services  PT Diagnosis Abnormality of gait   PT Problem List Decreased strength;Decreased activity tolerance;Decreased mobility;Cardiopulmonary status limiting activity  PT Treatment Interventions Gait training;Stair training;Functional mobility training;Therapeutic activities;Patient/family education   PT Goals (Current goals can be found in the Care Plan section) Acute Rehab PT Goals Patient Stated Goal: independent PT Goal Formulation: With patient Time For Goal Achievement: 03/10/15 Potential to  Achieve Goals: Good    Frequency Min 3X/week   Barriers to discharge        Co-evaluation               End of Session   Activity Tolerance: Patient tolerated treatment well Patient left: in chair;with call bell/phone within reach Nurse Communication: Mobility status         Time: 6067-7034 PT Time Calculation (min) (ACUTE ONLY): 24 min   Charges:   PT Evaluation $Initial PT Evaluation Tier I: 1 Procedure PT Treatments $Gait Training: 8-22 mins   PT G Codes:        Madylyn Insco, Tessie Fass 03/03/2015, 12:09 PM 03/03/2015  Donnella Sham, PT [phone]  (pager)

## 2015-03-03 NOTE — Care Management Note (Signed)
Case Management Note  Patient Details  Name: DEMONTAE ANTUNES MRN: [id number] Date of Birth: 20-Jun-1932  Subjective/Objective:       Pt admitted on 03/02/15 with Lt parietal hemorrhage.  PTA, pt independent, resides at home with spouse.               Action/Plan: Will follow for discharge needs as pt progresses.  PT/OT consults pending.    Expected Discharge Date:                  Expected Discharge Plan:  [place]  In-House Referral:     Discharge planning Services  CM Consult  Post Acute Care Choice:    Choice offered to:     DME Arranged:    DME Agency:     HH Arranged:    [place] Agency:     Status of Service:  In process, will continue to follow  Medicare Important Message Given:    Date Medicare IM Given:    Medicare IM give by:    Date Additional Medicare IM Given:    Additional Medicare Important Message give by:     If discussed at [place] of Stay Meetings, dates discussed:    Additional Comments:  Reinaldo Raddle, RN, BSN  Trauma/Neuro ICU Case Manager [phone]

## 2015-03-03 NOTE — Evaluation (Signed)
Speech Language Pathology Evaluation Patient Details Name: Raymond Chaney MRN: [id number] DOB: February 22, 1932 Today's Date: 03/03/2015 Time: 2423-5361 SLP Time Calculation (min) (ACUTE ONLY): 19 min  Problem List:  Patient Active Problem List   Diagnosis Date Noted  . Intraparenchymal hematoma of brain 03/02/2015  . ICH (intracerebral hemorrhage) 03/02/2015  . Lung metastasis 01/17/2015  . Cancer of upper lobe of right lung 09/29/2014  . Essential hypertension 10/15/2013  . Obstructive sleep apnea 10/15/2013  . Morbid obesity 10/15/2013  . Mixed hyperlipidemia with apolipoprotein E4 variant 10/15/2013  . Hyperlipidemia 10/15/2013  . SOB (shortness of breath), more uncomfortable  02/09/2013  . 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 Long term (current) use of anticoagulants 11/26/2012  . Epididymitis 09/08/2012  . UTI (lower urinary tract infection) 09/08/2012  . Atypical chest pain 09/08/2012  . Atrial fibrillation 09/08/2012  . Diabetes mellitus type 2 in obese 09/08/2012   Past Medical History:  Past Medical History  Diagnosis Date  . Arthritis   . CHF (congestive heart failure)   . COPD (chronic obstructive pulmonary disease)   . Hypertension   . Stroke   . Gout   . Alcohol abuse   . GIB (gastrointestinal bleeding)   . Complication of anesthesia     2004   stopped breathing  3 times  on the table "  . Shortness of breath   . Sleep apnea 09/08/2012    sleep study 08/20/12-ild sleep apnea with an AHI of 7.8/hr and durimg REM 27.2/hr: CPAP 10/04/12-tried nasal pillows and face mask, but couldnt tolerate  . Atrial fibrillation     chronic on coumadin; monitor 05/20/11- AFib    . DVT, lower extremity      2005  . Hyperlipidemia   . CAD (coronary artery disease)     myoview 10/19/09-diaphragmatic attenuation vs inferior scar without ischemia; echo 09/11/12-Ef 40-45%, L atrium mod to  severely dilated  . Radiation 10/18/14-10/28/14    SBRT right lateral lung mass 50 gray  . Diabetes mellitus without complication    Past Surgical History:  Past Surgical History  Procedure Laterality Date  . Total knee arthroplasty    . Colon surgery      colon polyps  . Hip surgery    . 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 Cardiac catheterization  10/11/02    noncritical CAD, EF 45-50%  . Cardiac catheterization  08/22/99    no significant CAD, nl EF   HPI:  81 yo male with a hx of lung cancer with radiation therapy, COPD, CHF, stroke age 89, hypertension, atrial fibrillation on anticoagulation and diabetes mellitus, presenting with headache for 10 days. He had a CT scan ordered by his physician which showed an acute left parietal area of hemorrhage injuring 18 mm, with significant surrounding edema. There was slight extension into left lateral ventricle. Patient is experienced vague visual symptoms but no frank field defect. He is experienced a headache with repeat coughs or strains. He has known differentiated adenocarcinoma of the lung. He was on Coumadin and INR was 1.5. He has undergone reversal protocol. His NIH stroke score was 0, including normal visual fields ; 03/02/15 CT head indicated 19 mm hematoma in the left parietal white matter with small intraventricular extension. Recommend updated brain MRI with contrast as this is primarily concerning for new metastatic disease;  MRI pending   Assessment / Plan / Recommendation Clinical Impression   Pt exhibited intelligible speech within complex conversation, auditory comprehension and verbal expression which were within functional limits and cognitive skills that were also WDL.  No f/u indicated for ST at this time as pt is exhibiting no deficits at this time from intraparenchymal hematoma    SLP Assessment  Patient does not need any further Speech Language Pathology Services     Follow Up Recommendations  None    Frequency and Duration   evaluation only     Pertinent Vitals/Pain Pain Assessment: No/denies pain   SLP Goals   N/A  SLP Evaluation Prior Functioning  Cognitive/Linguistic Baseline: Within functional limits Type of Home: House  Lives With: Spouse Available Help at Discharge: Family;Friend(s);Available 24 hours/day Vocation: Retired   Associate Professor  Overall Cognitive Status: Within Functional Limits for tasks assessed Arousal/Alertness: Awake/alert Orientation Level: Oriented X4 Memory: Appears intact Awareness: Appears intact Problem Solving: Appears intact Safety/Judgment: Appears intact    Comprehension  Auditory Comprehension Overall Auditory Comprehension: Appears within functional limits for tasks assessed Conversation: Complex Visual Recognition/Discrimination Discrimination: Not tested Reading Comprehension Reading Status: Not tested    Expression Expression Primary Mode of Expression: Verbal Verbal Expression Overall Verbal Expression: Appears within functional limits for tasks assessed Initiation: No impairment Level of Generative/Spontaneous Verbalization: Conversation Repetition: No impairment Naming: No impairment Pragmatics: No impairment Non-Verbal Means of Communication: Not applicable Written Expression Dominant Hand: Right Written Expression: Not tested   Oral / Motor Oral Motor/Sensory Function Overall Oral Motor/Sensory Function: Appears within functional limits for tasks assessed Motor Speech Overall Motor Speech: Appears within functional limits for tasks assessed Respiration: Within functional limits Phonation: Normal Resonance: Within functional limits Articulation: Within functional limitis Intelligibility: Intelligible Motor Planning: Witnin functional limits Motor Speech Errors: Not applicable        Olene Godfrey,PAT, M.S., CCC-SLP 03/03/2015, 10:15 AM

## 2015-03-03 NOTE — Telephone Encounter (Signed)
Closed encounter °

## 2015-03-04 DIAGNOSIS — S06350A Traumatic hemorrhage of left cerebrum without loss of consciousness, initial encounter: Secondary | ICD-10-CM

## 2015-03-04 DIAGNOSIS — C3490 Malignant neoplasm of unspecified part of unspecified bronchus or lung: Secondary | ICD-10-CM | POA: Insufficient documentation

## 2015-03-04 DIAGNOSIS — I611 Nontraumatic intracerebral hemorrhage in hemisphere, cortical: Secondary | ICD-10-CM

## 2015-03-04 DIAGNOSIS — C3411 Malignant neoplasm of upper lobe, right bronchus or lung: Secondary | ICD-10-CM

## 2015-03-04 LAB — GLUCOSE, CAPILLARY
GLUCOSE-CAPILLARY: 145 mg/dL — AB (ref 65–99)
GLUCOSE-CAPILLARY: 185 mg/dL — AB (ref 65–99)
Glucose-Capillary: 144 mg/dL — ABNORMAL HIGH (ref 65–99)

## 2015-03-04 MED ORDER — METOPROLOL SUCCINATE ER 25 MG PO TB24
50.0000 mg | ORAL_TABLET | Freq: Every day | ORAL | Status: DC
Start: 2015-03-04 — End: 2015-03-05
  Administered 2015-03-04 – 2015-03-05 (×2): 50 mg via ORAL
  Filled 2015-03-04: qty 2
  Filled 2015-03-04: qty 1

## 2015-03-04 MED ORDER — DEXAMETHASONE 4 MG PO TABS
4.0000 mg | ORAL_TABLET | Freq: Four times a day (QID) | ORAL | Status: DC
  Administered 2015-03-04 – 2015-03-05 (×5): 4 mg via ORAL
  Filled 2015-03-04 (×6): qty 1

## 2015-03-04 MED ORDER — PANTOPRAZOLE SODIUM 40 MG PO TBEC
40.0000 mg | DELAYED_RELEASE_TABLET | Freq: Every day | ORAL | Status: DC
  Administered 2015-03-04: 40 mg via ORAL
  Filled 2015-03-04: qty 1

## 2015-03-04 MED ORDER — IPRATROPIUM-ALBUTEROL 0.5-2.5 (3) MG/3ML IN SOLN
3.0000 mL | Freq: Four times a day (QID) | RESPIRATORY_TRACT | Status: DC
Start: 2015-03-04 — End: 2015-03-05
  Administered 2015-03-04 – 2015-03-05 (×2): 3 mL via RESPIRATORY_TRACT
  Filled 2015-03-04 (×3): qty 3

## 2015-03-04 MED ORDER — INSULIN ASPART 100 UNIT/ML ~~LOC~~ SOLN
0.0000 [IU] | Freq: Three times a day (TID) | SUBCUTANEOUS | Status: DC
  Administered 2015-03-04 – 2015-03-05 (×2): 1 [IU] via SUBCUTANEOUS

## 2015-03-04 NOTE — Progress Notes (Signed)
STROKE TEAM PROGRESS NOTE   HISTORY Raymond Chaney is an 81 y.o. male with a history of lung cancer and radiation therapy, COPD, CHF, stroke age 64, hypertension, atrial fibrillation on anticoagulation and diabetes mellitus, presenting with headache for 10 days. He had a CT scan ordered by his physician which showed an acute left parietal area of hemorrhage injuring 18 mm, with significant surrounding edema. There was slight extension into left lateral ventricle. Patient is experienced vague visual symptoms but no frank field defect. He is experienced a headache with repeat coughs or strains. He has known differentiated adenocarcinoma of the lung. He was on Coumadin and INR was 1.5. He has undergone reversal protocol. His NIH stroke score was 0, including normal visual fields   SUBJECTIVE (INTERVAL HISTORY) His wife is at the bedside.  Overall he feels his condition is unchanged. He states his and headache for nearly 2 weeks but denies any focal neurological symptoms. Discussed that MRI of the brain c/w metastatic lesion to the brain. Have contacted oncology on call who recommended decadron po q6hrs. Contacted his Radiation Oncologist who will review patient's case tomorrow and follow outpatient. Husband and wife understand. Will likely discharge tomorrow.    OBJECTIVE Temp:  [97.3 F (36.3 C)-98.8 F (37.1 C)] 98.4 F (36.9 C) (06/25 0346) Pulse Rate:  [43-98] 93 (06/25 0700) Cardiac Rhythm:  [-] Atrial fibrillation (06/25 0800) Resp:  [15-27] 16 (06/25 0700) BP: (105-153)/(44-121) 144/89 mmHg (06/25 0700) SpO2:  [93 %-100 %] 95 % (06/25 0700)  No results for input(s): GLUCAP in the last 168 hours.  Recent Labs Lab 03/02/15 1625  NA 140  K 4.4  CL 102  CO2 27  GLUCOSE 101*  BUN 12  CREATININE 0.83  CALCIUM 8.8*    Recent Labs Lab 03/02/15 1625  AST 23  ALT 15*  ALKPHOS 67  BILITOT 0.5  PROT 6.8  ALBUMIN 3.9    Recent Labs Lab 03/02/15 1625  WBC 7.8  NEUTROABS  6.3  HGB 11.8*  HCT 36.4*  MCV 97.3  PLT 165   No results for input(s): CKTOTAL, CKMB, CKMBINDEX, TROPONINI in the last 168 hours.  Recent Labs  03/02/15 1625 03/02/15 2009 03/03/15 0251 03/03/15 0730  LABPROT 18.2* 14.7 15.4* 15.6*  INR 1.50* 1.13 1.20 1.23   No results for input(s): COLORURINE, LABSPEC, PHURINE, GLUCOSEU, HGBUR, BILIRUBINUR, KETONESUR, PROTEINUR, UROBILINOGEN, NITRITE, LEUKOCYTESUR in the last 72 hours.  Invalid input(s): APPERANCEUR  No results found for: CHOL, TRIG, HDL, CHOLHDL, VLDL, LDLCALC No results found for: HGBA1C No results found for: LABOPIA, COCAINSCRNUR, LABBENZ, AMPHETMU, THCU, LABBARB  No results for input(s): ETH in the last 168 hours.   Imaging   Ct Head Wo Contrast 03/02/2015    19 mm hematoma in the left parietal white matter with small intraventricular extension. Recommend updated brain MRI with contrast as this is primarily concerning for new metastatic disease.      MRI Brain with Contrast  03/03/2015 2.2 cm hemorrhagic lesion in the left occipital lobe with mild surrounding edema. Mildly thick and irregular peripheral enhancement of the lesion is most concerning for a solitary hemorrhagic brain metastasis rather than a benign cerebral hematoma.    PHYSICAL EXAM Pleasant elderly Caucasian male currently not in distress. . Afebrile. Head is nontraumatic. Neck is supple without bruit.    Cardiac exam no murmur or gallop. Lungs are clear to auscultation. Distal pulses are well felt. Neurological Exam ;  Awake  Alert oriented x 3. Normal speech and  language.eye movements full without nystagmus.fundi were not visualized. Vision acuity and fields appear normal. Hearing is normal. Palatal movements are normal. Face symmetric. Tongue midline. Normal strength, tone, reflexes and coordination. Normal sensation. Gait deferred. ASSESSMENT/PLAN Mr. Raymond Chaney is an 81 y.o. male with history of lung cancer and radiation therapy, COPD,  CHF, stroke age 10, hypertension, atrial fibrillation on anticoagulation and diabetes mellitus, presenting with headache for 10 days. He did not receive IV t-PA due to possible hemorrhage.    Small left parietal subcortical hemorrhage with cytotoxic cerebral edema. Etiology likely metastatic brain lesion.   Resultant  mild headache but no focal deficits   MRI  as above  MRA  not performed  Carotid Doppler  not performed  2D Echo  not performed  LDL not performed  HgbA1c not performed  SCDs for VTE prophylaxis Diet Heart Room service appropriate?: Yes; Fluid consistency:: Thin  warfarin prior to admission, now on no antithrombotic  Ongoing aggressive stroke risk factor management  Therapy recommendations: Pending  Disposition: Pending  Hypertension  Home meds: Cardura, lisinopril, and metoprolol  Stable    Hyperlipidemia  Home meds: Pravachol not resumed in hospital  LDL not performed, goal < 70  Continue statin at discharge  Diabetes  HgbA1c not performed, goal < 7.0  Controlled  Other Stroke Risk Factors  Advanced age  Cigarette smoker, quit smoking 30 years ago.  Obesity, Body mass index is 34.65 kg/(m^2).   Hx stroke/TIA   Other Active Problems  Mild anemia  Atrial fibrillation - off anticoagulation  Brain metastasis by MRI - (Radiation Oncologist - Dr Sondra Come) Called Dr Earlie Server. He recommends starting Decadron 4 mg every 6 hours with further follow-up with Dr. Sondra Come. Call in to Dr Lisbeth Renshaw - he agrees with plan.   Other Pertinent History    Hospital day # 2  Personally reviewed MRI of the brain performed 03/03/2015 that showed hemorrhagic lesion in the left occipital lobe with surrounding edema. The lesion is roughly 2.2 cm and shows border enhancement. These findings are more consistent with metastatic lesion. Patient does have known lung cancer and this is the likely source. Spread of blood products are noted in the occipital horns.  Discussed with oncologist on-call as well as patient's radiation oncology office. Recommended starting Decadron, with outpatient follow-up. We'll likely keep patient overnight and discharge tomorrow. Neurologic exam today is stable.   Personally examined patient and images, and have participated in and made any corrections needed to history, physical, neuro exam,assessment and plan as stated above.   Sarina Ill, MD Stroke Neurology [phone] [facility]    To contact Stroke Continuity provider, please refer to [email or web address] After hours, contact General Neurology

## 2015-03-04 NOTE — H&P (Signed)
PCP:   Tivis Ringer, MD   Consult for medical management Requesting physician Dr. Leonie Man   HPI: 81 year old male who   has a past medical history of Arthritis; CHF (congestive heart failure); COPD (chronic obstructive pulmonary disease); Hypertension; Stroke; Gout; Alcohol abuse; GIB (gastrointestinal bleeding); Complication of anesthesia; Shortness of breath; Sleep apnea (09/08/2012); Atrial fibrillation; DVT, lower extremity; Hyperlipidemia; CAD (coronary artery disease); Radiation (10/18/14-10/28/14); and Diabetes mellitus without complication. Patient has a history of lung cancer poorly differentiated adenocarcinoma of right upper lung, status post radiation, atrial fibrillation on anti-correlation with Coumadin, diabetes mellitus who was admitted to the neuro ICU with acute left parietal hemorrhage with significant surrounding edema. MRI brain showed hemorrhagic metastatic lesion. Patient started on Decadron 4 mg every 6 hours. Patient to follow-up with radiation oncology as outpatient. Today he admits to having shortness of breath, patient has a history of COPD. Denies chest pain, no nausea vomiting or diarrhea. No fever no dysuria urgency frequency of urination.  Allergies:   Allergies  Allergen Reactions  . Penicillins Rash      Past Medical History  Diagnosis Date  . Arthritis   . CHF (congestive heart failure)   . COPD (chronic obstructive pulmonary disease)   . Hypertension   . Stroke   . Gout   . Alcohol abuse   . GIB (gastrointestinal bleeding)   . Complication of anesthesia     2004   stopped breathing  3 times  on the table "  . Shortness of breath   . Sleep apnea 09/08/2012    sleep study 08/20/12-ild sleep apnea with an AHI of 7.8/hr and durimg REM 27.2/hr: CPAP 10/04/12-tried nasal pillows and face mask, but couldnt tolerate  . Atrial fibrillation     chronic on coumadin; monitor 05/20/11- AFib    . DVT, lower extremity      2005  . Hyperlipidemia   . CAD  (coronary artery disease)     myoview 10/19/09-diaphragmatic attenuation vs inferior scar without ischemia; echo 09/11/12-Ef 40-45%, L atrium mod to severely dilated  . Radiation 10/18/14-10/28/14    SBRT right lateral lung mass 50 gray  . Diabetes mellitus without complication     Past Surgical History  Procedure Laterality Date  . Total knee arthroplasty    . Colon surgery      colon polyps  . Hip surgery    . 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 Cardiac catheterization  10/11/02    noncritical CAD, EF 45-50%  . Cardiac catheterization  08/22/99    no significant CAD, nl EF    Prior to Admission medications   Medication Sig Start Date End Date Taking? Authorizing Provider  allopurinol (ZYLOPRIM) 100 MG tablet Take 100 mg by mouth daily.   Yes Historical Provider, MD  Ascorbic Acid (VITAMIN C) 1000 MG tablet Take 1,000 mg by mouth daily.   Yes Historical Provider, MD  doxazosin (CARDURA) 4 MG tablet Take 4 mg by mouth daily.   Yes Historical Provider, MD  fluticasone (FLONASE) 50 MCG/ACT nasal spray Place 2 sprays into the nose daily as needed for allergies.  09/13/14  Yes Historical Provider, MD  lisinopril (PRINIVIL,ZESTRIL) 10 MG tablet Take 10 mg by mouth daily.   Yes Historical Provider, MD  metFORMIN (GLUCOPHAGE) 1000 MG tablet Take 1,000 mg by mouth daily with breakfast.   Yes Historical Provider, MD  metoprolol tartrate (LOPRESSOR) 50 MG tablet Take 1  tablet (50 mg total) by mouth daily. 09/12/12  Yes Thurnell Lose, MD  mirabegron ER (MYRBETRIQ) 50 MG TB24 tablet Take 50 mg by mouth daily.   Yes Historical Provider, MD  pravastatin (PRAVACHOL) 80 MG tablet Take 80 mg by mouth daily.   Yes Historical Provider, MD  warfarin (COUMADIN) 5 MG tablet Take 1 tablet by mouth daily or as directed Patient taking differently: Take 2.5-5 mg by mouth daily. Take 1/2 tablet (2.'5mg'$ ) MON & FRI. Take 1 ('5mg'$ ) tablet TUEWEDTHURSSATSUN  06/16/14  Yes Rockne Menghini, RPH-CPP    Social History:  reports that he quit smoking about 30 years ago. He has quit using smokeless tobacco. He reports that he does not drink alcohol or use illicit drugs.  Family History  Problem Relation Age of Onset  . Heart attack Father   . Diabetes Father   . Dementia Mother   . Cancer Sister   . Cancer Brother   . Heart disease Brother   . Cancer Brother   . Cancer Brother   . Cancer Sister     Danley Danker Weights   03/02/15 1700 03/02/15 1828  Weight: 118.3 kg (260 lb 12.9 oz) 115.9 kg (255 lb 8.2 oz)      Review of Systems:  As in the history of present illness, rest of review systems is negative   Physical Exam: Blood pressure 153/66, pulse 87, temperature 98.4 F (36.9 C), temperature source Oral, resp. rate 20, height 6' (1.829 m), weight 115.9 kg (255 lb 8.2 oz), SpO2 97 %. Constitutional:   Patient is a well-developed and well-nourished male* in no acute distress and cooperative with exam. Head: Normocephalic and atraumatic Mouth: Mucus membranes moist Eyes: PERRL, EOMI, conjunctivae normal Neck: Supple, No Thyromegaly Cardiovascular: RRR, S1 normal, S2 normal Pulmonary/Chest: Decreased breath sounds bilaterally Abdominal: Soft. Non-tender, non-distended, bowel sounds are normal, no masses, organomegaly, or guarding present.  Neurological: A&O x3, Strength is normal and symmetric bilaterally, cranial nerve II-XII are grossly intact, no focal motor deficit, sensory intact to light touch bilaterally.  Extremities : No Cyanosis, Clubbing or Edema  Labs on Admission:  Basic Metabolic Panel:  Recent Labs Lab 03/02/15 1625  NA 140  K 4.4  CL 102  CO2 27  GLUCOSE 101*  BUN 12  CREATININE 0.83  CALCIUM 8.8*   Liver Function Tests:  Recent Labs Lab 03/02/15 1625  AST 23  ALT 15*  ALKPHOS 67  BILITOT 0.5  PROT 6.8  ALBUMIN 3.9   No results for input(s): LIPASE, AMYLASE in the last 168 hours. No results for  input(s): AMMONIA in the last 168 hours. CBC:  Recent Labs Lab 03/02/15 1625  WBC 7.8  NEUTROABS 6.3  HGB 11.8*  HCT 36.4*  MCV 97.3  PLT 165   Radiological Exams on Admission: Mr Kizzie Fantasia Contrast  03/03/2015   CLINICAL DATA:  Headache for 10 days. History of lung cancer. Cerebral hematoma on recent head CT.  EXAM: MRI HEAD WITHOUT AND WITH CONTRAST  TECHNIQUE: Multiplanar, multiecho pulse sequences of the brain and surrounding structures were obtained without and with intravenous contrast.  CONTRAST:  73m MULTIHANCE GADOBENATE DIMEGLUMINE 529 MG/ML IV SOLN  COMPARISON:  Head CT 03/02/2015 and MRI 10/10/2014  FINDINGS: There is no acute infarct, midline shift, or extra-axial fluid collection. A chronic infarct is again noted in the superior right cerebellum with chronic blood products. A tiny, chronic cortical/subcortical infarct is again noted in the right parietal lobe. There is mild-to-moderate cerebral  atrophy. Small foci of T2 hyperintensity in the subcortical and deep cerebral white matter bilaterally are nonspecific but compatible with mild chronic small vessel ischemic disease.  As described on recent CT, there is a focus of parenchymal hemorrhage in the left occipital lobe which abuts the lateral margin of the occipital horn of the left lateral ventricle. The lesion measures 2.2 x 1.6 cm and demonstrates mildly thick and slightly irregular peripheral enhancement with mild surrounding vasogenic edema. A small amount of blood products are noted dependently in the left greater than right occipital horns. A small left frontal developmental venous anomaly is noted. No other focal enhancing brain lesions are identified.  Prior bilateral cataract extraction is noted. Left maxillary sinus mucous retention cysts are noted. Mastoid air cells are clear. Major intracranial vascular flow voids are preserved.  IMPRESSION: 2.2 cm hemorrhagic lesion in the left occipital lobe with mild surrounding edema.  Mildly thick and irregular peripheral enhancement of the lesion is most concerning for a solitary hemorrhagic brain metastasis rather than a benign cerebral hematoma.   Electronically Signed   By: Logan Bores   On: 03/03/2015 19:59       Assessment/Plan Active Problems:   Intraparenchymal hematoma of brain   ICH (intracerebral hemorrhage)   Cytotoxic brain edema  Lung cancer with brain metastasis Patient  with hemorrhagic brain metastasis, with mild surrounding edema. Started on Decadron 4 mg IV every 6 hours. Patient to follow-up with radiation oncology as outpatient  COPD Patient has history of COPD, will start DuoNeb nebulizers every 6 hours Oxygen via nasal cannula  Atrial fibrillation Patient has history of A. fib, heart rate is controlled currently off Coumadin due to the cerebral hemorrhage.  Diabetes mellitus As patient has been started on Decadron, will start sliding scale insulin with NovoLog.  Hypertension Continue metoprolol 50 g by mouth daily, when necessary labetalol.    Family discussion: Admission, patients condition and plan of care including tests being ordered have been discussed with the patient and *his wife at bedside* who indicate understanding and agree with the plan   Time Spent on Admission: 55 min  [facility] Pager: [phone] 03/04/2015, 3:57 PM  If 7PM-7AM, please contact night-coverage  [email or web address]  Password [email or web address]

## 2015-03-05 DIAGNOSIS — I4891 Unspecified atrial fibrillation: Secondary | ICD-10-CM

## 2015-03-05 DIAGNOSIS — I610 Nontraumatic intracerebral hemorrhage in hemisphere, subcortical: Secondary | ICD-10-CM

## 2015-03-05 DIAGNOSIS — C7931 Secondary malignant neoplasm of brain: Principal | ICD-10-CM

## 2015-03-05 DIAGNOSIS — G936 Cerebral edema: Secondary | ICD-10-CM

## 2015-03-05 DIAGNOSIS — E785 Hyperlipidemia, unspecified: Secondary | ICD-10-CM

## 2015-03-05 LAB — GLUCOSE, CAPILLARY
Glucose-Capillary: 135 mg/dL — ABNORMAL HIGH (ref 65–99)
Glucose-Capillary: 145 mg/dL — ABNORMAL HIGH (ref 65–99)
Glucose-Capillary: 146 mg/dL — ABNORMAL HIGH (ref 65–99)

## 2015-03-05 MED ORDER — DEXAMETHASONE 4 MG PO TABS: 4.0000 mg | ORAL_TABLET | Freq: Four times a day (QID) | ORAL | Status: DC

## 2015-03-05 MED ORDER — METOPROLOL SUCCINATE ER 50 MG PO TB24: 50.0000 mg | ORAL_TABLET | Freq: Every day | ORAL | Status: DC

## 2015-03-05 MED ORDER — PANTOPRAZOLE SODIUM 40 MG PO TBEC: 40.0000 mg | DELAYED_RELEASE_TABLET | Freq: Every day | ORAL | Status: AC

## 2015-03-05 MED ORDER — ACETAMINOPHEN 325 MG PO TABS: 650.0000 mg | ORAL_TABLET | ORAL | Status: AC | PRN

## 2015-03-05 NOTE — Discharge Instructions (Signed)
Ask Dr Sondra Come when it will be OK to start taking Coumadin (Warfarin) again. Ask Dr Sondra Come how long he wants you to stay on Decadron. We wrote Rx for 1 week. Be sure to see Dr Sondra Come this week.  Monitor blood sugar. Decadron can increase glucose levels. No driving for now until cleared by MD Increase activity slowly.

## 2015-03-05 NOTE — Progress Notes (Addendum)
PROGRESS NOTE  Raymond Chaney LKJ:[id number] DOB: 09/29/1931 DOA: 03/02/2015 PCP: Tivis Ringer, MD  Brief history 81 year old male with history of COPD, hypertension, adenocarcinoma of the long, atrial fibrillation on warfarin, but it is mellitus, coronary artery disease presented with headache of 10 days duration. CT of the brain revealed left parietal hematoma with a small amount of intraventricular extension.  MRI of the brain revealed a 2.2 cm hemorrhagic lesion in the left occipital lobe with mild surrounding edema. INR was 1.50 on the day of admission. The lesion was irregular with peripheral enhancement concerning for metastasis. The patient was admitted to the neurology service. The patient was transferred out of ICU on 03/04/15. He remained clinically stable.  Assessment/Plan: Left occipital lobe intracerebral hemorrhage -Etiology likely metastatic brain lesion in the setting of warfarin use -Warfarin was discontinued -MRI brain revealed 2.2 cm hemorrhagic lesion in the left occipital lobe with mild surrounding edema -Physical therapy recommended home health physical therapy -Speech therapy felt the patient was stable for cardiac diet  -Continue Decadron 4 mg po every 6 hours until follow-up with radiation oncology Hypertension  -Blood pressure was somewhat labile but controlled during the hospitalization  -Continue metoprolol succinate 50 mg daily  -Continue Cardura 4 mg daily  -Restart lisinopril 10 mg daily Atrial fibrillation  -Remained rate controlled  -Deferred to neurology as to appropriate time, if any, to restart antiplatelets agents Mixed hyperlipidemia  -Continue Pravachol  -Follow up with primary care provider   CAD (coronary artery disease), 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 Tami Ribas revealing an anomalous circumflex mild  to moderate CAD and normal LV function. -Deferred to neurology as to appropriate time, if any, to restart antiplatelets agents Diabetes mellitus type 2 -Patient was on metformin in the outpatient setting -This can be resumed after discharge -Continue follow-up with primary care provider -CBGs controlled during hospitalization 140-180 even with decadron COPD -stopped smoking 25 yrs ago -stable on RA - >40 pk year hx   Family Communication:   Pt at beside Disposition Plan:   Stable for discharge from internal medicine standpoint         Procedures/Studies: Ct Head Wo Contrast  03/02/2015   CLINICAL DATA:  Frontal headache for 14 days. History of lung cancer.  EXAM: CT HEAD WITHOUT CONTRAST  TECHNIQUE: Contiguous axial images were obtained from the base of the skull through the vertex without intravenous contrast.  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 No evidence of osseous metastatic disease.  Orbits: No acute abnormality.  Brain: 18 mm hematoma in the left parietal white matter around the lateral ventricle. There is a fluid hematocrit levels suggesting intracystic hemorrhage. Small volume hemorrhage present in the occipital horn of the left lateral ventricle which is not obstructed. There is neighboring vasogenic edema. No additional mass lesion is seen.  Remote right superior cerebellum infarction with gliosis.  Generalized brain atrophy.  Critical Value/emergent results were called by telephone at the time of interpretation on 03/02/2015 at 2:20 pm to Dr. Prince Solian , who verbally acknowledged these results. The patient will be sent for workup at [facility] ED.  IMPRESSION: 19 mm hematoma in the left parietal white matter with small intraventricular extension. Recommend updated brain MRI with contrast as this is primarily concerning for new metastatic disease.  Electronically Signed   By: Monte Fantasia M.D.   On: 03/02/2015 14:31    Mr [name] CB Contrast  03/03/2015   CLINICAL DATA:  Headache for 10 days. History of lung cancer. Cerebral hematoma on recent head CT.  EXAM: MRI HEAD WITHOUT AND WITH CONTRAST  TECHNIQUE: Multiplanar, multiecho pulse sequences of the brain and surrounding structures were obtained without and with intravenous contrast.  CONTRAST:  22m MULTIHANCE GADOBENATE DIMEGLUMINE 529 MG/ML IV SOLN  COMPARISON:  Head CT 03/02/2015 and MRI 10/10/2014  FINDINGS: There is no acute infarct, midline shift, or extra-axial fluid collection. A chronic infarct is again noted in the superior right cerebellum with chronic blood products. A tiny, chronic cortical/subcortical infarct is again noted in the right parietal lobe. There is mild-to-moderate cerebral atrophy. Small foci of T2 hyperintensity in the subcortical and deep cerebral white matter bilaterally are nonspecific but compatible with mild chronic small vessel ischemic disease.  As described on recent CT, there is a focus of parenchymal hemorrhage in the left occipital lobe which abuts the lateral margin of the occipital horn of the left lateral ventricle. The lesion measures 2.2 x 1.6 cm and demonstrates mildly thick and slightly irregular peripheral enhancement with mild surrounding vasogenic edema. A small amount of blood products are noted dependently in the left greater than right occipital horns. A small left frontal developmental venous anomaly is noted. No other focal enhancing brain lesions are identified.  Prior bilateral cataract extraction is noted. Left maxillary sinus mucous retention cysts are noted. Mastoid air cells are clear. Major intracranial vascular flow voids are preserved.  IMPRESSION: 2.2 cm hemorrhagic lesion in the left occipital lobe with mild surrounding edema. Mildly thick and irregular peripheral enhancement of the lesion is most concerning for a solitary hemorrhagic brain metastasis rather than a benign cerebral hematoma.   Electronically  Signed   By: ALogan Bores  On: 03/03/2015 19:59        Subjective: Patient denies fevers, chills, headache, chest pain, dyspnea, nausea, vomiting, diarrhea, abdominal pain, dysuria, hematuria   Objective: Filed Vitals:   03/05/15 0145 03/05/15 0627 03/05/15 0905 03/05/15 0920  BP: 148/97 129/51  147/65  Pulse: 82 75  98  Temp: 97.8 F (36.6 C) 98.1 F (36.7 C)  97.8 F (36.6 C)  TempSrc: Oral Oral  Oral  Resp: '19 20  20  '$ Height:      Weight:      SpO2: 99% 100% 94% 92%    Intake/Output Summary (Last 24 hours) at 03/05/15 0930 Last data filed at 03/05/15 0006  Gross per 24 hour  Intake 118.75 ml  Output    750 ml  Net -631.25 ml   Weight change:  Exam:   General:  Pt is alert, follows commands appropriately, not in acute distress  HEENT: No icterus, No thrush,  Kyle/AT  Cardiovascular: RRR, S1/S2, no rubs, no gallops  Respiratory: CTA bilaterally, no wheezing, no crackles, no rhonchi  Abdomen: Soft/+BS, non tender, non distended, no guarding  Extremities: trace LE edema, No lymphangitis, No petechiae, No rashes, no synovitis  Data Reviewed: Basic Metabolic Panel:  Recent Labs Lab 03/02/15 1625  NA 140  K 4.4  CL 102  CO2 27  GLUCOSE 101*  BUN 12  CREATININE 0.83  CALCIUM 8.8*   Liver Function Tests:  Recent Labs Lab 03/02/15 1625  AST 23  ALT 15*  ALKPHOS 67  BILITOT 0.5  PROT 6.8  ALBUMIN 3.9   No results for  input(s): LIPASE, AMYLASE in the last 168 hours. No results for input(s): AMMONIA in the last 168 hours. CBC:  Recent Labs Lab 03/02/15 1625  WBC 7.8  NEUTROABS 6.3  HGB 11.8*  HCT 36.4*  MCV 97.3  PLT 165   Cardiac Enzymes: No results for input(s): CKTOTAL, CKMB, CKMBINDEX, TROPONINI in the last 168 hours. BNP: Invalid input(s): POCBNP CBG:  Recent Labs Lab 03/04/15 1624 03/04/15 2001 03/04/15 2355 03/05/15 0402 03/05/15 0738  GLUCAP 144* 185* 145* 135* 145*    Recent Results (from the past 240 hour(s))    MRSA PCR Screening     Status: None   Collection Time: 03/02/15  6:24 PM  Result Value Ref Range Status   MRSA by PCR NEGATIVE NEGATIVE Final    Comment:        The GeneXpert MRSA Assay (FDA approved for NASAL specimens only), is one component of a comprehensive MRSA colonization surveillance program. It is not intended to diagnose MRSA infection nor to guide or monitor treatment for MRSA infections.      Scheduled Meds: . dexamethasone  4 mg Oral 4 times per day  . insulin aspart  0-9 Units Subcutaneous TID WC  . ipratropium-albuterol  3 mL Nebulization Q6H  . metoprolol succinate  50 mg Oral Daily  . pantoprazole  40 mg Oral QHS  . senna-docusate  1 tablet Oral BID   Continuous Infusions:    Ausar Georgiou, DO  [facility] Pager [phone]  If 7PM-7AM, please contact night-coverage [email or web address] Password [email or web address] 03/05/2015, 9:30 AM   LOS: 3 days

## 2015-03-05 NOTE — Progress Notes (Signed)
STROKE TEAM PROGRESS NOTE   HISTORY Raymond Chaney is an 81 y.o. male with a history of lung cancer and radiation therapy, COPD, CHF, stroke age 58, hypertension, atrial fibrillation on anticoagulation and diabetes mellitus, presenting with headache for 10 days. He had a CT scan ordered by his physician which showed an acute left parietal area of hemorrhage injuring 18 mm, with significant surrounding edema. There was slight extension into left lateral ventricle. Patient is experienced vague visual symptoms but no frank field defect. He is experienced a headache with repeat coughs or strains. He has known differentiated adenocarcinoma of the lung. He was on Coumadin and INR was 1.5. He has undergone reversal protocol. His NIH stroke score was 0, including normal visual fields   SUBJECTIVE (INTERVAL HISTORY) His wife is at the bedside.  Overall he feels his condition is unchanged. He states his and headache for nearly 2 weeks but denies any focal neurological symptoms. Discussed that MRI of the brain c/w metastatic lesion to the brain. Have contacted oncology on call who recommended decadron po q6hrs. Contacted his Radiation Oncologist who will review patient's case tomorrow and follow outpatient. Husband and wife understand. Will likely discharge tomorrow.    OBJECTIVE Temp:  [97.8 F (36.6 C)-98.9 F (37.2 C)] 97.8 F (36.6 C) (06/26 0920) Pulse Rate:  [75-98] 98 (06/26 0920) Cardiac Rhythm:  [-] Atrial fibrillation (06/25 2000) Resp:  [18-20] 20 (06/26 0920) BP: (109-153)/(51-97) 147/65 mmHg (06/26 0920) SpO2:  [92 %-100 %] 92 % (06/26 0920)   Recent Labs Lab 03/04/15 1624 03/04/15 2001 03/04/15 2355 03/05/15 0402 03/05/15 0738  GLUCAP 144* 185* 145* 135* 145*    Recent Labs Lab 03/02/15 1625  NA 140  K 4.4  CL 102  CO2 27  GLUCOSE 101*  BUN 12  CREATININE 0.83  CALCIUM 8.8*    Recent Labs Lab 03/02/15 1625  AST 23  ALT 15*  ALKPHOS 67  BILITOT 0.5  PROT 6.8   ALBUMIN 3.9    Recent Labs Lab 03/02/15 1625  WBC 7.8  NEUTROABS 6.3  HGB 11.8*  HCT 36.4*  MCV 97.3  PLT 165   No results for input(s): CKTOTAL, CKMB, CKMBINDEX, TROPONINI in the last 168 hours.  Recent Labs  03/02/15 1625 03/02/15 2009 03/03/15 0251 03/03/15 0730  LABPROT 18.2* 14.7 15.4* 15.6*  INR 1.50* 1.13 1.20 1.23   No results for input(s): COLORURINE, LABSPEC, PHURINE, GLUCOSEU, HGBUR, BILIRUBINUR, KETONESUR, PROTEINUR, UROBILINOGEN, NITRITE, LEUKOCYTESUR in the last 72 hours.  Invalid input(s): APPERANCEUR  No results found for: CHOL, TRIG, HDL, CHOLHDL, VLDL, LDLCALC No results found for: HGBA1C No results found for: LABOPIA, COCAINSCRNUR, LABBENZ, AMPHETMU, THCU, LABBARB  No results for input(s): ETH in the last 168 hours.   Imaging   Ct Head Wo Contrast 03/02/2015    19 mm hematoma in the left parietal white matter with small intraventricular extension. Recommend updated brain MRI with contrast as this is primarily concerning for new metastatic disease.      MRI Brain with Contrast  03/03/2015 2.2 cm hemorrhagic lesion in the left occipital lobe with mild surrounding edema. Mildly thick and irregular peripheral enhancement of the lesion is most concerning for a solitary hemorrhagic brain metastasis rather than a benign cerebral hematoma.    PHYSICAL EXAM Pleasant elderly Caucasian male currently not in distress. . Afebrile. Head is nontraumatic. Neck is supple without bruit.    Cardiac exam no murmur or gallop. Lungs are clear to auscultation. Distal pulses are well felt.  Neurological Exam ;  Awake  Alert oriented x 3. Normal speech and language.eye movements full without nystagmus.fundi were not visualized. Vision acuity and fields appear normal. Hearing is normal. Palatal movements are normal. Face symmetric. Tongue midline. Normal strength, tone, reflexes and coordination. Normal sensation. Gait deferred.    ASSESSMENT/PLAN Mr. Raymond Chaney is an 81 y.o. male with history of lung cancer and radiation therapy, COPD, CHF, stroke age 69, hypertension, atrial fibrillation on anticoagulation and diabetes mellitus, presenting with headache for 10 days. He did not receive IV t-PA due to possible hemorrhage.    Small left parietal subcortical hemorrhage with cytotoxic cerebral edema. Etiology likely metastatic brain lesion.   Resultant  mild headache but no focal deficits   MRI  as above  MRA  not performed  Carotid Doppler  not performed  2D Echo  not performed  LDL not performed  HgbA1c not performed  SCDs for VTE prophylaxis Diet Heart Room service appropriate?: Yes; Fluid consistency:: Thin  warfarin prior to admission, now on no antithrombotic  Ongoing aggressive stroke risk factor management  Therapy recommendations: Deferred - no stroke.  Disposition: Discharge today with early follow-up with radiation oncologist.  Hypertension  Home meds: Cardura, lisinopril, and metoprolol  Stable    Hyperlipidemia  Home meds: Pravachol not resumed in hospital  LDL not performed, goal < 70  Continue statin at discharge  Diabetes  HgbA1c not performed, goal < 7.0  Controlled  Other Stroke Risk Factors  Advanced age  Cigarette smoker, quit smoking 30 years ago.  Obesity, Body mass index is 34.65 kg/(m^2).   Hx stroke/TIA   Other Active Problems  Mild anemia  Atrial fibrillation - off anticoagulation  Brain metastasis by MRI - (Radiation Oncologist - Dr Sondra Come) Called Dr Earlie Server. He recommends starting Decadron 4 mg every 6 hours with further follow-up with Dr. Sondra Come. Call in to Dr Lisbeth Renshaw - he agrees with plan.   Other Pertinent History    Hospital day # 3  Personally reviewed MRI of the brain performed 03/03/2015 that showed hemorrhagic lesion in the left occipital lobe with surrounding edema. The lesion is roughly 2.2 cm and shows border enhancement. These findings are more consistent  with metastatic lesion. Patient does have known lung cancer and this is the likely source. Spread of blood products are noted in the occipital horns. Discussed with oncologist on-call as well as patient's radiation oncology office. Recommended starting Decadron, with outpatient follow-up. We'll likely keep patient overnight and discharge tomorrow. Neurologic exam today is stable.   Personally examined patient and images, and have participated in and made any corrections needed to history, physical, neuro exam,assessment and plan as stated above.   Sarina Ill, MD Stroke Neurology [phone] [facility]    To contact Stroke Continuity provider, please refer to [email or web address] After hours, contact General Neurology

## 2015-03-05 NOTE — Progress Notes (Signed)
Pt for discharge home today. Discharge orders received.  2 IVs dcd with dressing clean dry and intact. Discharge instructions and prescriptions given with verbalized understanding. Wife at bedside to assist with discharge. Staff brought patient to lobby via wheelchair at 1200. Transported to home by Wife.

## 2015-03-05 NOTE — Discharge Summary (Signed)
Stroke Discharge Summary  Patient ID: Raymond Chaney   MRN: [id number]      DOB: [birth year]  Date of Admission: 03/02/2015 Date of Discharge: 03/05/2015  Attending Physician:  Garvin Fila, MD, Stroke MD  Consulting Physician(s):   Treatment Team:  Marcheta Grammes, MD None  Patient's PCP:  Tivis Ringer, MD  DISCHARGE DIAGNOSIS: Solitary hemorrhagic brain metastasis Active Problems:   Atrial fibrillation   Essential hypertension   Hyperlipidemia   Cancer of upper lobe of right lung   Intraparenchymal hematoma of brain   ICH (intracerebral hemorrhage)   Cytotoxic brain edema   Lung cancer  BMI: Body mass index is 34.65 kg/(m^2).  Past Medical History  Diagnosis Date  . Arthritis   . CHF (congestive heart failure)   . COPD (chronic obstructive pulmonary disease)   . Hypertension   . Stroke   . Gout   . Alcohol abuse   . GIB (gastrointestinal bleeding)   . Complication of anesthesia     2004   stopped breathing  3 times  on the table "  . Shortness of breath   . Sleep apnea 09/08/2012    sleep study 08/20/12-ild sleep apnea with an AHI of 7.8/hr and durimg REM 27.2/hr: CPAP 10/04/12-tried nasal pillows and face mask, but couldnt tolerate  . Atrial fibrillation     chronic on coumadin; monitor 05/20/11- AFib    . DVT, lower extremity      2005  . Hyperlipidemia   . CAD (coronary artery disease)     myoview 10/19/09-diaphragmatic attenuation vs inferior scar without ischemia; echo 09/11/12-Ef 40-45%, L atrium mod to severely dilated  . Radiation 10/18/14-10/28/14    SBRT right lateral lung mass 50 gray  . Diabetes mellitus without complication    Past Surgical History  Procedure Laterality Date  . Total knee arthroplasty    . Colon surgery      colon polyps  . Hip surgery    . 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 Cardiac catheterization  10/11/02    noncritical CAD, EF 45-50%  . Cardiac  catheterization  08/22/99    no significant CAD, nl EF      Medication List    STOP taking these medications        lisinopril 10 MG tablet  Commonly known as:  PRINIVIL,ZESTRIL     metoprolol 50 MG tablet  Commonly known as:  LOPRESSOR     warfarin 5 MG tablet  Commonly known as:  COUMADIN      TAKE these medications        acetaminophen 325 MG tablet  Commonly known as:  TYLENOL  Take 2 tablets (650 mg total) by mouth every 4 (four) hours as needed for mild pain (or temp > 99 F).     allopurinol 100 MG tablet  Commonly known as:  ZYLOPRIM  Take 100 mg by mouth daily.     dexamethasone 4 MG tablet  Commonly known as:  DECADRON  Take 1 tablet (4 mg total) by mouth every 6 (six) hours.     doxazosin 4 MG tablet  Commonly known as:  CARDURA  Take 4 mg by mouth daily.     fluticasone 50 MCG/ACT nasal spray  Commonly known as:  FLONASE  Place 2 sprays into the nose daily as needed for allergies.     metFORMIN 1000 MG tablet  Commonly  known as:  GLUCOPHAGE  Take 1,000 mg by mouth daily with breakfast.     metoprolol succinate 50 MG 24 hr tablet  Commonly known as:  TOPROL-XL  Take 1 tablet (50 mg total) by mouth daily.     MYRBETRIQ 50 MG Tb24 tablet  Generic drug:  mirabegron ER  Take 50 mg by mouth daily.     pantoprazole 40 MG tablet  Commonly known as:  PROTONIX  Take 1 tablet (40 mg total) by mouth at bedtime.     pravastatin 80 MG tablet  Commonly known as:  PRAVACHOL  Take 80 mg by mouth daily.     vitamin C 1000 MG tablet  Take 1,000 mg by mouth daily.        LABORATORY STUDIES CBC    Component Value Date/Time   WBC 7.8 03/02/2015 1625   RBC 3.74* 03/02/2015 1625   HGB 11.8* 03/02/2015 1625   HCT 36.4* 03/02/2015 1625   PLT 165 03/02/2015 1625   MCV 97.3 03/02/2015 1625   MCH 31.6 03/02/2015 1625   MCHC 32.4 03/02/2015 1625   RDW 13.6 03/02/2015 1625   LYMPHSABS 0.8 03/02/2015 1625   MONOABS 0.6 03/02/2015 1625   EOSABS 0.1  03/02/2015 1625   BASOSABS 0.0 03/02/2015 1625   CMP    Component Value Date/Time   NA 140 03/02/2015 1625   K 4.4 03/02/2015 1625   CL 102 03/02/2015 1625   CO2 27 03/02/2015 1625   GLUCOSE 101* 03/02/2015 1625   BUN 12 03/02/2015 1625   CREATININE 0.83 03/02/2015 1625   CREATININE 0.82 09/28/2014 1106   CALCIUM 8.8* 03/02/2015 1625   PROT 6.8 03/02/2015 1625   ALBUMIN 3.9 03/02/2015 1625   AST 23 03/02/2015 1625   ALT 15* 03/02/2015 1625   ALKPHOS 67 03/02/2015 1625   BILITOT 0.5 03/02/2015 1625   GFRNONAA >60 03/02/2015 1625   GFRAA >60 03/02/2015 1625   COAGS Lab Results  Component Value Date   INR 1.23 03/03/2015   INR 1.20 03/03/2015   INR 1.13 03/02/2015   Lipid PanelNo results found for: CHOL, TRIG, HDL, CHOLHDL, VLDL, LDLCALC HgbA1C No results found for: HGBA1C Cardiac Panel (last 3 results) No results for input(s): CKTOTAL, CKMB, TROPONINI, RELINDX in the last 72 hours. Urinalysis    Component Value Date/Time   COLORURINE YELLOW 09/08/2012 1510   APPEARANCEUR CLOUDY* 09/08/2012 1510   LABSPEC 1.026 09/08/2012 1510   PHURINE 5.0 09/08/2012 1510   GLUCOSEU 100* 09/08/2012 1510   HGBUR NEGATIVE 09/08/2012 1510   BILIRUBINUR NEGATIVE 09/08/2012 1510   KETONESUR NEGATIVE 09/08/2012 1510   PROTEINUR NEGATIVE 09/08/2012 1510   UROBILINOGEN 0.2 09/08/2012 1510   NITRITE NEGATIVE 09/08/2012 1510   LEUKOCYTESUR LARGE* 09/08/2012 1510   Urine Drug Screen No results found for: LABOPIA, COCAINSCRNUR, LABBENZ, AMPHETMU, THCU, LABBARB  Alcohol Level No results found for: [facility]   SIGNIFICANT DIAGNOSTIC STUDIES  Imaging   Ct Head Wo Contrast 03/02/2015  19 mm hematoma in the left parietal white matter with small intraventricular extension. Recommend updated brain MRI with contrast as this is primarily concerning for new metastatic disease.    MRI Brain with Contrast  03/03/2015 2.2 cm hemorrhagic lesion in the left occipital lobe with mild surrounding  edema. Mildly thick and irregular peripheral enhancement of the lesion is most concerning for a solitary hemorrhagic brain metastasis rather than a benign cerebral hematoma.     HISTORY OF PRESENT ILLNESS PROCTOR CARRIKER is an 81 y.o. male with  a history of lung cancer and radiation therapy, COPD, CHF, stroke age 89, hypertension, atrial fibrillation on anticoagulation and diabetes mellitus, presenting with headache for 10 days. He had a CT scan ordered by his physician which showed an acute left parietal area of hemorrhage injuring 18 mm, with significant surrounding edema. There was slight extension into left lateral ventricle. Patient is experienced vague visual symptoms but no frank field defect. He is experienced a headache with repeat coughs or strains. He has known differentiated adenocarcinoma of the lung. He was on Coumadin and INR was 1.5. He has undergone reversal protocol. His NIH stroke score was 0, including normal visual fields  HOSPITAL COURSE Mr. REILLY BLADES is an 81 y.o. male with history of lung cancer and radiation therapy, COPD, CHF, stroke age 8, hypertension, atrial fibrillation on anticoagulation and diabetes mellitus, presenting with headache for 10 days. He did not receive IV t-PA due to possible hemorrhage.   Small left parietal subcortical hemorrhage with cytotoxic cerebral edema. Etiology likely metastatic brain lesion.  Coumadin held.  Resultant mild headache but no focal deficits   MRI as above  MRA not performed  SCDs for VTE prophylaxis  Diet Heart Room service appropriate?: Yes; Fluid consistency:: Thin  Warfarin prior to admission, now on no antithrombotic  Ongoing aggressive stroke risk factor management  Therapy recommendations: Deferred - no stroke.  Disposition: Discharge today with early follow-up with Dr Sondra Come per Dr Lisbeth Renshaw  Hypertension  Home meds: Cardura, lisinopril, and metoprolol  Stable on metoprolol only.    Restart Cardura at time of discharge. (May need for urinary reasons)  Metoprolol changed to Toprol XL  Continue to hold Lotensin until seen by primary care MD Dr Steva Ready. (within 2 wks)  Hyperlipidemia  Home meds: Pravachol not resumed in hospital  LDL not performed  Continue statin at discharge  Diabetes  HgbA1c not performed, goal < 7.0  Controlled  Monitor closely while on Decadron.  Other Stroke Risk Factors  Advanced age  Cigarette smoker, quit smoking 30 years ago.  Obesity, Body mass index is 34.65 kg/(m^2).   Hx stroke/TIA   Other Active Problems  Mild anemia  Atrial fibrillation - off anticoagulation  Brain metastasis by MRI - (Radiation Oncologist - Dr Sondra Come) Called Dr Earlie Server. He recommends starting Decadron 4 mg every 6 hours with further follow-up with Dr. Sondra Come. Call in to Dr Lisbeth Renshaw - he agrees with plan.  DISCHARGE EXAM Blood pressure 147/65, pulse 98, temperature 97.8 F (36.6 C), temperature source Oral, resp. rate 20, height 6' (1.829 m), weight 115.9 kg (255 lb 8.2 oz), SpO2 92 %.   Pleasant elderly Caucasian male currently not in distress. . Afebrile. Head is nontraumatic. Neck is supple without bruit. Cardiac exam no murmur or gallop. Lungs are clear to auscultation. Distal pulses are well felt. Neurological Exam ;  Awake Alert oriented x 3. Normal speech and language.eye movements full without nystagmus.fundi flat. Vision acuity and fields appear normal. Hearing is normal. Palatal movements are normal. Face symmetric. Tongue midline. Normal strength, tone, reflexes and coordination. Normal sensation. Gait deferred.  Discharge Diet   Diet Heart Room service appropriate?: Yes; Fluid consistency:: Thin Diet - low sodium heart healthy liquids  DISCHARGE PLAN  Disposition:  Discharge to home.  no antithrombotic for secondary stroke prevention due to hemorrhage.  Follow-up Tivis Ringer, MD within 1 week. Lotensin on hold  for stable BP on current medications.   Follow-up with Dr. Sondra Come this week. Call office Monday to make  appt.  Resume coumadin when OK with Dr Sondra Come.   Decadron started per telephone conversation with Dr Earlie Server. Rx for 1 week supply.   No driving until cleared by MD   40 minutes were spent preparing discharge.  Mikey Bussing PA-C [facility] Pager [phone] 03/05/2015, 11:50 AM      Personally examined patient and images, and have participated in and made any corrections needed to history, physical, neuro exam,assessment and plan as stated above.  Sarina Ill, MD Stroke Neurology [phone] [facility]    .

## 2015-03-06 LAB — HEMOGLOBIN A1C
Hgb A1c MFr Bld: 6.2 % — ABNORMAL HIGH (ref 4.8–5.6)
Mean Plasma Glucose: 131 mg/dL

## 2015-03-09 ENCOUNTER — Telehealth: Payer: Self-pay | Admitting: Oncology

## 2015-03-09 NOTE — Telephone Encounter (Signed)
Raymond Chaney called and said he was wondering what the plan was to treat his brain tumor.  Advised him that he has an appointment with Dr. Sondra Come on 03/22/15 at 9 am.  Raymond Chaney verbalized agreement and understanding.

## 2015-03-10 ENCOUNTER — Telehealth: Payer: Self-pay | Admitting: Oncology

## 2015-03-10 ENCOUNTER — Other Ambulatory Visit: Payer: Self-pay | Admitting: Radiation Oncology

## 2015-03-10 ENCOUNTER — Other Ambulatory Visit: Payer: Self-pay | Admitting: *Deleted

## 2015-03-10 DIAGNOSIS — C7931 Secondary malignant neoplasm of brain: Secondary | ICD-10-CM

## 2015-03-10 DIAGNOSIS — C7949 Secondary malignant neoplasm of other parts of nervous system: Principal | ICD-10-CM

## 2015-03-10 MED ORDER — DEXAMETHASONE 4 MG PO TABS: 4.0000 mg | ORAL_TABLET | Freq: Four times a day (QID) | ORAL | Status: AC

## 2015-03-10 NOTE — Telephone Encounter (Signed)
Mr. Raymond Chaney called and said that he has an appointment with Dr. Lisbeth Renshaw on 03/16/15.  He said that he will be 2-3 days short of his decadron by then.  He is taking 4 mg every 6 hours.  Advised him that Dr. Lisbeth Renshaw will be contacted and we will call him back.

## 2015-03-15 ENCOUNTER — Encounter: Payer: Self-pay | Admitting: Cardiovascular Disease

## 2015-03-15 ENCOUNTER — Ambulatory Visit: Payer: Medicare Other | Admitting: Cardiovascular Disease

## 2015-03-15 ENCOUNTER — Ambulatory Visit: Payer: Medicare Other | Admitting: Pharmacist Clinician (PhC)/ Clinical Pharmacy Specialist

## 2015-03-15 ENCOUNTER — Ambulatory Visit (INDEPENDENT_AMBULATORY_CARE_PROVIDER_SITE_OTHER): Payer: Medicare Other | Admitting: Cardiovascular Disease

## 2015-03-15 VITALS — BP 112/60 | HR 111 | Ht 72.0 in | Wt 261.0 lb

## 2015-03-15 DIAGNOSIS — I4891 Unspecified atrial fibrillation: Secondary | ICD-10-CM | POA: Diagnosis not present

## 2015-03-15 DIAGNOSIS — I10 Essential (primary) hypertension: Secondary | ICD-10-CM | POA: Diagnosis not present

## 2015-03-15 DIAGNOSIS — I2510 Atherosclerotic heart disease of native coronary artery without angina pectoris: Secondary | ICD-10-CM

## 2015-03-15 DIAGNOSIS — I2583 Coronary atherosclerosis due to lipid rich plaque: Secondary | ICD-10-CM

## 2015-03-15 DIAGNOSIS — Z7901 Long term (current) use of anticoagulants: Secondary | ICD-10-CM

## 2015-03-15 DIAGNOSIS — E782 Mixed hyperlipidemia: Secondary | ICD-10-CM

## 2015-03-15 NOTE — Progress Notes (Signed)
03/15/2015 Raymond Chaney   [birth year]  [id number]  Primary Physician Raymond Ringer, MD Primary Cardiologist: Raymond Harp MD Raymond Chaney   HPI:  Raymond Chaney is a 81 year old mildly overweight married Caucasian male, father of 69 and grandfather of 6 grandchildren, who I last saw in the office 12/30/14... He has a history of moderate CAD by catheterization performed by Raymond Chaney August 2008 with an anomalous circumflex and normal LV function. His other problems include COPD with remote tobacco abuse, having quit 20 years ago, with dyspnea on exertion. He has hypertension, hyperlipidemia, insulin-dependent diabetes, and chronic atrial fibrillation, on Coumadin anticoagulation. He does have diabetic peripheral neuropathy by symptoms. His last Myoview, performed October 19, 2009, showed diaphragmatic attenuation versus inferior scar without ischemia. He has an excellent lipid profile performed by Raymond Chaney for secondary prevention. He is followed with obstructive sleep apnea by Raymond Chaney. Unfortunately the patient is unable to wear the CPAP.  He had a Myoview stress test performed 02/16/13 which was low risk. 2-D echocardiography performed 09/10/12 revealed an ejection fraction of 40-45%.  He had developed lung cancer was which was brought to clinical attention because of hemoptysis on Coumadin. He subsequent his had radiation therapy and has recently been diagnosed with a intracranial metastasis. Because of this his oral anticoagulation was discontinued.  Current Outpatient Prescriptions  Medication Sig Dispense Refill  . acetaminophen (TYLENOL) 325 MG tablet Take 2 tablets (650 mg total) by mouth every 4 (four) hours as needed for mild pain (or temp > 99 F).    Marland Kitchen allopurinol (ZYLOPRIM) 100 MG tablet Take 100 mg by mouth daily.    . Ascorbic Acid (VITAMIN C) 1000 MG tablet Take 1,000 mg by mouth daily.    Marland Kitchen dexamethasone (DECADRON) 4 MG tablet Take 1 tablet (4 mg  total) by mouth every 6 (six) hours. 50 tablet 0  . doxazosin (CARDURA) 4 MG tablet Take 4 mg by mouth daily.    . fluticasone (FLONASE) 50 MCG/ACT nasal spray Place 2 sprays into the nose daily as needed for allergies.   0  . metFORMIN (GLUCOPHAGE) 1000 MG tablet Take 1,000 mg by mouth daily with breakfast.    . metoprolol succinate (TOPROL-XL) 50 MG 24 hr tablet Take 1 tablet (50 mg total) by mouth daily. 30 tablet 1  . mirabegron ER (MYRBETRIQ) 50 MG TB24 tablet Take 50 mg by mouth daily.    . pantoprazole (PROTONIX) 40 MG tablet Take 1 tablet (40 mg total) by mouth at bedtime. 30 tablet 0  . pravastatin (PRAVACHOL) 80 MG tablet Take 80 mg by mouth daily.     No current facility-administered medications for this visit.    Allergies  Allergen Reactions  . Penicillins Rash    History   Social History  . Marital Status: Married    Spouse Name: N/A  . Number of Children: N/A  . Years of Education: N/A   Occupational History  . Not on file.   Social History Main Topics  . Smoking status: Former Smoker    Quit date: 09/08/1984  . Smokeless tobacco: Former Systems developer  . Alcohol Use: No     Comment: HX of Alcohol abuse    Quit  in 2004  . Drug Use: No  . Sexual Activity: Not on file   Other Topics Concern  . Not on file   Social History Narrative     Review of Systems: General: negative for chills, fever, night sweats or  weight changes.  Cardiovascular: negative for chest pain, dyspnea on exertion, edema, orthopnea, palpitations, paroxysmal nocturnal dyspnea or shortness of breath Dermatological: negative for rash Respiratory: negative for cough or wheezing Urologic: negative for hematuria Abdominal: negative for nausea, vomiting, diarrhea, bright red blood per rectum, melena, or hematemesis Neurologic: negative for visual changes, syncope, or dizziness All other systems reviewed and are otherwise negative except as noted above.    Blood pressure 112/60, pulse 111, height  6' (1.829 m), weight 261 lb (118.389 kg).  General appearance: alert and no distress Neck: no adenopathy, no carotid bruit, no JVD, supple, symmetrical, trachea midline and thyroid not enlarged, symmetric, no tenderness/mass/nodules Lungs: clear to auscultation bilaterally Heart: irregularly irregular rhythm Extremities: extremities normal, atraumatic, no cyanosis or edema  EKG atrial fibrillation with a ventricular response of 111, occasional aberrantly conducted beats with nonspecific ST and T-wave changes. I personally reviewed this EKG  ASSESSMENT AND PLAN:   Mixed hyperlipidemia with apolipoprotein E4 variant History of hyperlipidemia on pravastatin 80 mg a day followed by his PCP  Long term current use of anticoagulant therapy The patient was on Coumadin and evacuation because of chronic A. Fib. He was recently found to have an intracranial metastasis with surrounding edema. His Coumadin was discontinued because of fear of hemorrhagic transformation.  Essential hypertension History of hypertension blood pressure measured at 112/60. He is on metoprolol. 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 Tami Chaney August 2008 revealing an anomalous circumflex and normal LV function. A Myoview stress test performed 02/16/13 was low risk and an ejection fraction of 40-45% was demonstrated on 2-D echo performed 09/10/12. He denies chest pain and has chronic shortness of breath.  Atrial fibrillation History of chronic A. Fib rate controlled currently not on oral and a coagulation. His ventricular response is 111. At this time, we will keep him on his current medications. He is getting Decadron for his intracranial metastasis which may be controlling to his slightly elevated heart rate.      Raymond Harp MD  FACP,FACC,FAHA, [facility] 03/15/2015 11:04 AM

## 2015-03-15 NOTE — Patient Instructions (Signed)
We request that you follow-up in: 6 months with an extender and in 1 year with Dr Berry  You will receive a reminder letter in the mail two months in advance. If you don't receive a letter, please call our office to schedule the follow-up appointment.   

## 2015-03-15 NOTE — Assessment & Plan Note (Signed)
The patient was on Coumadin and evacuation because of chronic A. Fib. He was recently found to have an intracranial metastasis with surrounding edema. His Coumadin was discontinued because of fear of hemorrhagic transformation.

## 2015-03-15 NOTE — Assessment & Plan Note (Signed)
History of hypertension blood pressure measured at 112/60. He is on metoprolol. Continue current meds at current dosing

## 2015-03-15 NOTE — Assessment & Plan Note (Signed)
History of coronary artery disease status post cardiac catheterization by Dr. Tami Ribas August 2008 revealing an anomalous circumflex and normal LV function. A Myoview stress test performed 02/16/13 was low risk and an ejection fraction of 40-45% was demonstrated on 2-D echo performed 09/10/12. He denies chest pain and has chronic shortness of breath.

## 2015-03-15 NOTE — Assessment & Plan Note (Signed)
History of hyperlipidemia on pravastatin 80 mg a day followed by his PCP

## 2015-03-15 NOTE — Assessment & Plan Note (Signed)
History of chronic A. Fib rate controlled currently not on oral and a coagulation. His ventricular response is 111. At this time, we will keep him on his current medications. He is getting Decadron for his intracranial metastasis which may be controlling to his slightly elevated heart rate.

## 2015-03-16 ENCOUNTER — Ambulatory Visit
Admission: RE | Admit: 2015-03-16 | Discharge: 2015-03-16 | Disposition: A | Discharge status: Home / Self Care | Payer: Medicare Other | Source: Ambulatory Visit | Attending: Radiation Oncology | Admitting: Radiation Oncology

## 2015-03-16 ENCOUNTER — Encounter: Payer: Self-pay | Admitting: Radiation Oncology

## 2015-03-16 ENCOUNTER — Ambulatory Visit
Admission: RE | Admit: 2015-03-16 | Discharge: 2015-03-16 | Disposition: A | Discharge status: Home / Self Care | Payer: Medicare Other | Source: Ambulatory Visit

## 2015-03-16 DIAGNOSIS — C7949 Secondary malignant neoplasm of other parts of nervous system: Principal | ICD-10-CM

## 2015-03-16 DIAGNOSIS — C7802 Secondary malignant neoplasm of left lung: Secondary | ICD-10-CM | POA: Insufficient documentation

## 2015-03-16 DIAGNOSIS — C7931 Secondary malignant neoplasm of brain: Secondary | ICD-10-CM

## 2015-03-16 HISTORY — DX: Personal history of irradiation: Z92.3

## 2015-03-16 HISTORY — DX: Malignant neoplasm of brain, unspecified: C71.9

## 2015-03-16 HISTORY — DX: Allergy, unspecified, initial encounter: T78.40XA

## 2015-03-16 MED ORDER — GADOBENATE DIMEGLUMINE 529 MG/ML IV SOLN
20.0000 mL | Freq: Once | INTRAVENOUS | Status: AC | PRN
  Administered 2015-03-16: 20 mL via INTRAVENOUS

## 2015-03-16 NOTE — Progress Notes (Signed)
Wrote and gave verbal instructions not to take his metformin tomorrow 03/17/15-03/20/15 until after lab work repeated ,having ct simulation tomorrow, teacvh back given by patient and wife, 5:08 PM

## 2015-03-16 NOTE — Progress Notes (Signed)
Radiation Oncology         [phone] ________________________________  Name: Raymond Chaney MRN: [id number]  Date: 03/16/2015  DOB: Jan 29, 1932  DG:UYQI,[id number] R, MD  Gery Pray, MD   Gilford Raid, MD  REFERRING PHYSICIAN: Gilford Raid, MD  DIAGNOSIS: The primary encounter diagnosis was Metastasis to brain. A diagnosis of Brain metastasis was also pertinent to this visit.   HISTORY OF PRESENT ILLNESS::Raymond Chaney is a 81 y.o. male who is seen for an initial consultation visit regarding the patient's new diagnosis of brain metastasis. He has a history of adenocarcinoma of the lung. He recently completed a second course of stereotactic body radiation treatment to the left lower lung in our clinic through Dr. Sondra Come.  The patient states that he did well with this. He had some hemoptysis which resolved with this treatment.  The patient recently developed significant headaches for approximately 10 days. A CT scan of the brain was ordered and this revealed a small area of hemorrhage in the left parietal region. A subsequent brain MRI scan on 03/03/2015 showed a 2.2 cm hemorrhagic lesion in the left occipital lobe at this location with mild surrounding edema. The patient was started on steroid-induced and he has been on 4 mg 4 times a day which he continues. He states this has markedly improved his headaches. He denies any currently. The patient underwent a brain MRI scan today for radiosurgery planning. No additional lesions were seen.    PREVIOUS RADIATION THERAPY: Yes as above stereotactic body radiation treatment to the lung   PAST MEDICAL HISTORY:  has a past medical history of Arthritis; CHF (congestive heart failure); COPD (chronic obstructive pulmonary disease); Hypertension; Stroke; Gout; Alcohol abuse; GIB (gastrointestinal bleeding); Complication of anesthesia; Shortness of breath; Sleep apnea (09/08/2012); Atrial fibrillation; DVT, lower extremity; Hyperlipidemia;  CAD (coronary artery disease); Radiation (10/18/14-10/28/14); Diabetes mellitus without complication; S/P radiation therapy (01/31/15-02/14/15); Brain cancer (03/03/15 MRI); Allergy; and COPD (chronic obstructive pulmonary disease).     PAST SURGICAL HISTORY: Past Surgical History  Procedure Laterality Date  . Total knee arthroplasty    . Colon surgery      colon polyps  . Hip surgery    . 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 Cardiac catheterization  10/11/02    noncritical CAD, EF 45-50%  . Cardiac catheterization  08/22/99    no significant CAD, nl EF     FAMILY HISTORY: family history includes Cancer in his brother, brother, brother, sister, and sister; Dementia in his mother; Diabetes in his father; Heart attack in his father; Heart disease in his brother.   SOCIAL HISTORY:  reports that he quit smoking about 30 years ago. He has quit using smokeless tobacco. He reports that he does not drink alcohol or use illicit drugs.   ALLERGIES: Penicillins   MEDICATIONS:  Current Outpatient Prescriptions  Medication Sig Dispense Refill  . acetaminophen (TYLENOL) 325 MG tablet Take 2 tablets (650 mg total) by mouth every 4 (four) hours as needed for mild pain (or temp > 99 F). (Patient not taking: Reported on 03/16/2015)    . allopurinol (ZYLOPRIM) 100 MG tablet Take 100 mg by mouth daily.    . Ascorbic Acid (VITAMIN C) 1000 MG tablet Take 1,000 mg by mouth daily.    Marland Kitchen dexamethasone (DECADRON) 4 MG tablet Take 1 tablet (4 mg total) by mouth every 6 (six) hours. 50 tablet 0  . doxazosin (CARDURA)  4 MG tablet Take 4 mg by mouth daily.    . fluticasone (FLONASE) 50 MCG/ACT nasal spray Place 2 sprays into the nose daily as needed for allergies.   0  . metFORMIN (GLUCOPHAGE) 1000 MG tablet Take 1,000 mg by mouth daily with breakfast.    . metoprolol succinate (TOPROL-XL) 50 MG 24 hr tablet Take 1 tablet (50 mg total) by mouth daily. 30  tablet 1  . mirabegron ER (MYRBETRIQ) 50 MG TB24 tablet Take 50 mg by mouth daily.    . pantoprazole (PROTONIX) 40 MG tablet Take 1 tablet (40 mg total) by mouth at bedtime. 30 tablet 0  . pravastatin (PRAVACHOL) 80 MG tablet Take 80 mg by mouth daily.     No current facility-administered medications for this encounter.     REVIEW OF SYSTEMS:  A 15 point review of systems is documented in the electronic medical record. This was obtained by the nursing staff. However, I reviewed this with the patient to discuss relevant findings and make appropriate changes.  Pertinent items are noted in HPI.    PHYSICAL EXAM:  vitals were not taken for this visit.  ECOG = 1  0 - Asymptomatic (Fully active, able to carry on all predisease activities without restriction)  1 - Symptomatic but completely ambulatory (Restricted in physically strenuous activity but ambulatory and able to carry out work of a light or sedentary nature. For example, light housework, office work)  2 - Symptomatic, <50% in bed during the day (Ambulatory and capable of all self care but unable to carry out any work activities. Up and about more than 50% of waking hours)  3 - Symptomatic, >50% in bed, but not bedbound (Capable of only limited self-care, confined to bed or chair 50% or more of waking hours)  4 - Bedbound (Completely disabled. Cannot carry on any self-care. Totally confined to bed or chair)  5 - Death   Eustace Pen MM, Creech RH, Tormey DC, et al. [phone]). "Toxicity and response criteria of the [facility]". [place] Oncol. 5 (6): 649-55    LABORATORY DATA:  Lab Results  Component Value Date   WBC 7.8 03/02/2015   HGB 11.8* 03/02/2015   HCT 36.4* 03/02/2015   MCV 97.3 03/02/2015   PLT 165 03/02/2015   Lab Results  Component Value Date   NA 140 03/02/2015   K 4.4 03/02/2015   CL 102 03/02/2015   CO2 27 03/02/2015   Lab Results  Component Value Date   ALT 15* 03/02/2015   AST 23  03/02/2015   ALKPHOS 67 03/02/2015   BILITOT 0.5 03/02/2015      RADIOGRAPHY: Ct Head Wo Contrast  03/02/2015   CLINICAL DATA:  Frontal headache for 14 days. History of lung cancer.  EXAM: CT HEAD WITHOUT CONTRAST  TECHNIQUE: Contiguous axial images were obtained from the base of the skull through the vertex without intravenous contrast.  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 No evidence of osseous metastatic disease.  Orbits: No acute abnormality.  Brain: 18 mm hematoma in the left parietal white matter around the lateral ventricle. There is a fluid hematocrit levels suggesting intracystic hemorrhage. Small volume hemorrhage present in the occipital horn of the left lateral ventricle which is not obstructed. There is neighboring vasogenic edema. No additional mass lesion is seen.  Remote right superior cerebellum infarction with gliosis.  Generalized brain atrophy.  Critical Value/emergent results were called by telephone at  the time of interpretation on 03/02/2015 at 2:20 pm to Dr. Prince Solian , who verbally acknowledged these results. The patient will be sent for workup at [facility] ED.  IMPRESSION: 19 mm hematoma in the left parietal white matter with small intraventricular extension. Recommend updated brain MRI with contrast as this is primarily concerning for new metastatic disease.   Electronically Signed   By: Monte Fantasia M.D.   On: 03/02/2015 14:31   Mr [name] DZ Contrast  03/16/2015   CLINICAL DATA:  Lung cancer. Brain metastases. Hemorrhagic left occipital lobe lesion.  EXAM: MRI HEAD WITHOUT AND WITH CONTRAST  TECHNIQUE: Multiplanar, multiecho pulse sequences of the brain and surrounding structures were obtained without and with intravenous contrast.  CONTRAST:  83m MULTIHANCE GADOBENATE DIMEGLUMINE 529 MG/ML IV SOLN  COMPARISON:  MRI brain 03/03/2015 and 10/10/2014.  FINDINGS: The hemorrhagic lesion within the medial left occipital  lobe is stable to slightly decreased in size on the T1 weighted images, now measuring 13 x 18 x 17 mm. Enhancement is again noted with this lesion.  No other foci of parenchymal enhancement are evident. The study is mildly degraded by patient motion which could obscure very small lesions.  A remote right superior cerebellar hemorrhagic infarct is again noted without change.  No other foci of hemorrhage are evident.  Surrounding vasogenic edema in the left occipital lobe is stable to slightly decreased since the prior exam.  Mild generalized atrophy is otherwise stable. The ventricles are proportionate to the degree of atrophy.  Flow is present in the major intracranial arteries. Bilateral lens replacements are noted. Opacification of posterior left ethmoid air cells is stable a polyp or mucous retention cyst in the left maxillary sinus is stable. The mastoid air cells are clear.  IMPRESSION: 1. Stable to slight decrease in size of hemorrhagic lesion in the medial left occipital lobe, now measuring 13 x 18 x 17 mm. 2. Enhancement associated with the area of hemorrhage suggest underlying solitary metastasis. 3. Remote right superior cerebellar hemorrhagic infarct. 4. Otherwise stable atrophy.   Electronically Signed   By: CSan MorelleM.D.   On: 03/16/2015 11:27   Mr BJeri CosWHGContrast  03/03/2015   CLINICAL DATA:  Headache for 10 days. History of lung cancer. Cerebral hematoma on recent head CT.  EXAM: MRI HEAD WITHOUT AND WITH CONTRAST  TECHNIQUE: Multiplanar, multiecho pulse sequences of the brain and surrounding structures were obtained without and with intravenous contrast.  CONTRAST:  269mMULTIHANCE GADOBENATE DIMEGLUMINE 529 MG/ML IV SOLN  COMPARISON:  Head CT 03/02/2015 and MRI 10/10/2014  FINDINGS: There is no acute infarct, midline shift, or extra-axial fluid collection. A chronic infarct is again noted in the superior right cerebellum with chronic blood products. A tiny, chronic  cortical/subcortical infarct is again noted in the right parietal lobe. There is mild-to-moderate cerebral atrophy. Small foci of T2 hyperintensity in the subcortical and deep cerebral white matter bilaterally are nonspecific but compatible with mild chronic small vessel ischemic disease.  As described on recent CT, there is a focus of parenchymal hemorrhage in the left occipital lobe which abuts the lateral margin of the occipital horn of the left lateral ventricle. The lesion measures 2.2 x 1.6 cm and demonstrates mildly thick and slightly irregular peripheral enhancement with mild surrounding vasogenic edema. A small amount of blood products are noted dependently in the left greater than right occipital horns. A small left frontal developmental venous anomaly is noted. No other focal enhancing brain  lesions are identified.  Prior bilateral cataract extraction is noted. Left maxillary sinus mucous retention cysts are noted. Mastoid air cells are clear. Major intracranial vascular flow voids are preserved.  IMPRESSION: 2.2 cm hemorrhagic lesion in the left occipital lobe with mild surrounding edema. Mildly thick and irregular peripheral enhancement of the lesion is most concerning for a solitary hemorrhagic brain metastasis rather than a benign cerebral hematoma.   Electronically Signed   By: Logan Bores   On: 03/03/2015 19:59       IMPRESSION:  Oncology History   Patient presented with worsening SOB with exertion and noted blood tinged sputum 07/2014  Cancer of upper lobe of right lung   Staging form: Lung, AJCC 7th Edition     Clinical: Stage IB (T2a, N0, M0) - Unsigned       Cancer of upper lobe of right lung   08/25/2014 Imaging CT Chest IMPRESSION: 3.3 cm cavitary spiculated mass in the right upper lobe consistent with primary carcinoma of the ung with probable parenchymal hemorrhage around the mass.   09/01/2014 Imaging PET scan IMPRESSION: 1. Right upper lobe, cavitary lesion exhibits  intense FDG uptake. Assuming non-small cell lung cancer this would be consistent with a T2aN0M0 lesion or stage IB tumor. An infectious or inflammatory process may also exhibit    09/15/2014 Procedure CT Biopsy IMPRESSION: Technically successful CT guided core needle core biopsy of centrally cavitary nodule within the subpleural aspect of the right upper lobe. Acquired samples were sent for both surgical pathologic analysis as well as fungal    09/29/2014 Initial Diagnosis Cancer of upper lobe of right lung   10/06/2014 -  Radiation Therapy SBRT [facility]   10/10/2014 Imaging MRI Brain IMPRESSION: Negative for metastatic disease to the brain  Atrophy and chronic ischemic change. No acute infarct    The patient has a new diagnosis of brain metastasis. He has a solitary lesion. The patient's case has been discussed in multidisciplinary brain conference. It was felt that he most likely would benefit from a course of radiosurgery to the solitary lesion. There was not a strong feeling that the patient should undergo surgery as initial treatment. The patient is scheduled to see neurosurgery early next week.  I discussed with the patient the rationale for radiosurgery for this lesion. He discussed the possible side effects and risks of treatment as well. All of his questions were answered.   PLAN: The patient will be scheduled for a simulation tomorrow such that we can proceed with treatment planning. He has been scheduled for a single fraction of radiosurgery next week. As noted above, he does see neurosurgery next week. If there are any changes in his assessment then his current plan to proceed with a single fraction of radiosurgery can account for this as needed. The patient knows to contact our office with any difficulties or questions prior to his treatment next week and he will proceed with simulation tomorrow.    I spent 30 minutes face to face with the patient and more than 50% of that time was spent in  counseling and/or coordination of care.    ________________________________   Jodelle Gross, MD, PhD   **Disclaimer: This note was dictated with voice recognition software. Similar sounding words can inadvertently be transcribed and this note may contain transcription errors which may not have been corrected upon publication of note.**

## 2015-03-16 NOTE — Progress Notes (Signed)
Location/Histology of Brain Tumor: Left Occipital lobe 2.2cm hemorrhagic lesion metastases  Patient presented with symptoms of:  Headaches for 10 days  CT ordered  Past or anticipated interventions, if any, per neurosurgery: Dr. Berta Minor Ahern,MD  Stroke Neurology,[facility] 03/05/15 Shanon Brow Rinehuls,PA_C<[facility]  Past or anticipated interventions, if any, per medical oncology: Dr. Julien Nordmann  Recommended Decadron and follow up with Radiation Oncology   Dose of Decadron, if applicable: '4mg'$  oral every 6 hours  Recent neurologic symptoms, if any:   Seizures: None  Headaches:NO  Nausea: NO  Dizziness/ataxia: NO  Difficulty with hand coordination: NO  Focal numbness/weakness: No  Visual deficits/changes: NOne  Confusion/Memory deficits: No  Painful bone metastases at present, if any: no  SAFETY ISSUES:YES  Prior radiation?  Yes,  01/31/15-02/14/15 Left lower lobe nodule lung, 50Gy/48f SBRT Right lateral lung 10/18/14-10/28/14  50Gy with Dr. KSondra Come Pacemaker/ICD? NO  Is the patient on methotrexate?  NO  Additional Complaints / other details: Married,   Lung  Cancer, HX A-fib(coumadin stopped) /CAD, Cardiac catheterization 36H,(68/37/2902;09/09/1550,;&0/80/2233)/KPQA,ESLP,NPYYFR,TMY,TRZNabuse, complications of anesthesia(stopped breathing 3x on table) There were no vitals taken for this visit.  Wt Readings from Last 3 Encounters:  03/16/15 260 lb 1.6 oz (117.981 kg)  03/16/15 261 lb (118.389 kg)  03/15/15 261 lb (118.389 kg)

## 2015-03-17 ENCOUNTER — Ambulatory Visit
Admission: RE | Admit: 2015-03-17 | Discharge: 2015-03-17 | Disposition: A | Discharge status: Home / Self Care | Payer: Medicare Other | Source: Ambulatory Visit | Attending: Radiation Oncology | Admitting: Radiation Oncology

## 2015-03-17 ENCOUNTER — Ambulatory Visit: Payer: Medicare Other | Admitting: Radiation Oncology

## 2015-03-17 ENCOUNTER — Other Ambulatory Visit: Payer: Self-pay | Admitting: Radiation Therapy

## 2015-03-17 VITALS — BP 131/56 | HR 112 | Resp 16

## 2015-03-17 DIAGNOSIS — C7802 Secondary malignant neoplasm of left lung: Secondary | ICD-10-CM | POA: Diagnosis not present

## 2015-03-17 DIAGNOSIS — C7931 Secondary malignant neoplasm of brain: Secondary | ICD-10-CM

## 2015-03-17 MED ORDER — SODIUM CHLORIDE 0.9 % IJ SOLN
10.0000 mL | Freq: Once | INTRAMUSCULAR | Status: AC
  Administered 2015-03-17: 10 mL via INTRAVENOUS

## 2015-03-17 NOTE — Progress Notes (Signed)
Vitals stable. Patient denies pain. BUN 12 and Creatinine 0.83 from 03/02/2015. Patient confirms he did not take his Metformin this morning. Started right AC 22 gauge IV on the first attempt. Patient tolerated well. Patient escorted to CT for simulation by therapist.

## 2015-03-20 ENCOUNTER — Ambulatory Visit: Payer: Medicare Other | Admitting: Radiation Oncology

## 2015-03-20 ENCOUNTER — Ambulatory Visit
Admission: RE | Admit: 2015-03-20 | Discharge: 2015-03-20 | Disposition: A | Discharge status: Home / Self Care | Payer: Medicare Other | Source: Ambulatory Visit | Attending: Radiation Oncology | Admitting: Radiation Oncology

## 2015-03-20 DIAGNOSIS — Z510 Encounter for antineoplastic radiation therapy: Secondary | ICD-10-CM | POA: Insufficient documentation

## 2015-03-20 DIAGNOSIS — C7802 Secondary malignant neoplasm of left lung: Secondary | ICD-10-CM | POA: Diagnosis not present

## 2015-03-20 DIAGNOSIS — C7931 Secondary malignant neoplasm of brain: Secondary | ICD-10-CM | POA: Insufficient documentation

## 2015-03-20 LAB — BUN AND CREATININE (CC13)
BUN: 25.8 mg/dL (ref 7.0–26.0)
CREATININE: 0.9 mg/dL (ref 0.7–1.3)
EGFR: 77 mL/min/{1.73_m2} — ABNORMAL LOW (ref >90)

## 2015-03-21 ENCOUNTER — Encounter: Payer: Self-pay | Admitting: Radiation Therapy

## 2015-03-21 DIAGNOSIS — C7802 Secondary malignant neoplasm of left lung: Secondary | ICD-10-CM | POA: Diagnosis not present

## 2015-03-21 NOTE — Progress Notes (Signed)
Called pt on 7/12 to let him know he can resume taking his Metformin. His labs are within normal limits. He has been asked to drink more fluids over the next few days as well because his BUN is a higher than the last lab draw. That being said, his creat is perfect, so no worries of resuming medication.  Mont Dutton R.T. (R) (T) Radiation Special Procedures [place] [phone] Office [phone] Pager [phone] Fax [email or web address]

## 2015-03-22 ENCOUNTER — Telehealth: Payer: Self-pay | Admitting: Cardiovascular Disease

## 2015-03-22 ENCOUNTER — Ambulatory Visit: Payer: Medicare Other

## 2015-03-22 ENCOUNTER — Ambulatory Visit
Admission: RE | Admit: 2015-03-22 | Discharge: 2015-03-22 | Disposition: A | Discharge status: Home / Self Care | Payer: Medicare Other | Source: Ambulatory Visit | Attending: Radiation Oncology | Admitting: Radiation Oncology

## 2015-03-22 ENCOUNTER — Ambulatory Visit: Payer: Medicare Other | Admitting: Radiation Oncology

## 2015-03-22 VITALS — BP 107/51 | HR 108 | Temp 97.4°F | Resp 16

## 2015-03-22 DIAGNOSIS — C7931 Secondary malignant neoplasm of brain: Secondary | ICD-10-CM

## 2015-03-22 DIAGNOSIS — C7802 Secondary malignant neoplasm of left lung: Secondary | ICD-10-CM | POA: Diagnosis not present

## 2015-03-22 MED ORDER — METOPROLOL SUCCINATE ER 50 MG PO TB24: 50.0000 mg | ORAL_TABLET | Freq: Every day | ORAL | Status: AC

## 2015-03-22 NOTE — Telephone Encounter (Signed)
°  1. Which medications need to be refilled? Metoprolol   2. Which pharmacy is medication to be sent to?[facility]   3. Do they need a 30 day or 90 day supply? 90  4. Would they like a call back once the medication has been sent to the pharmacy? yes

## 2015-03-22 NOTE — Progress Notes (Signed)
Higinio Plan here for post [facility] monitoring.  He is alert and oriented to person, place and time.  He denies pain, headached and nausea.  He reports he has been "staggering" this week.

## 2015-03-22 NOTE — Progress Notes (Addendum)
In 3 days   Starting Saturday   Dexamethasone(decadron)Take '4mg'$  tab , 1 tab three times a day for 1 week Then 1 tab ('4mg'$ ) 2 times a day for 1 week,  then 1 tab ('4mg'$  ) 1  A day for 1 week,  Then 1/2 tab('2mg'$ )  1 x  Daily for 1 week , then follow up in 1 month, Monday 04/24/15 at 945am 2:50 PM'

## 2015-03-22 NOTE — Telephone Encounter (Signed)
Refill submitted to patient's preferred pharmacy. Informed patient. Pt voiced understanding, no other stated concerns at this time.  

## 2015-03-22 NOTE — Op Note (Signed)
Stereotactic Radiosurgery Operative Note  Name: Raymond Chaney MRN: [id number]  Date: 03/22/2015  DOB: 07/13/1932  Op Note  Pre Operative Diagnosis:  Lung cancer with brain metastases  Post Operative Diagnois:  Lung cancer with brain metastases  3D TREATMENT PLANNING AND DOSIMETRY:  The patient's radiation plan was reviewed and approved by myself (neurosurgery) and Dr. Kyung Rudd (radiation oncology) prior to treatment.  It showed 3-dimensional radiation distributions overlaid onto the planning CT/MRI image set.  The [facility] for the target structures as well as the organs at risk were reviewed. The documentation of the 3D plan and dosimetry are filed in the radiation oncology EMR.  NARRATIVE:  Raymond Chaney was brought to the TrueBeam stereotactic radiation treatment machine and placed supine on the CT couch. The head frame was applied, and the patient was set up for stereotactic radiosurgery.  I was present for the set-up and delivery.  SIMULATION VERIFICATION:  In the couch zero-angle position, the patient underwent Exactrac imaging using the Brainlab system with orthogonal KV images.  These were carefully aligned and repeated to confirm treatment position for each of the isocenters.  The Exactrac snap film verification was repeated at each couch angle.  SPECIAL TREATMENT PROCEDURE: Raymond Chaney received stereotactic radiosurgery to the following targets: Left parieto-occipital periventricular target (13x18x44m) was treated using 3 Dynamic Conformal Arcs to a prescription dose of 20 Gy.  ExacTrac registration was performed for each couch angle.  The 82% isodose line was prescribed.  STEREOTACTIC TREATMENT MANAGEMENT:  Following delivery, the patient was transported to nursing in stable condition and monitored for possible acute effects.  Vital signs were recorded There were no vitals taken for this visit.. The patient tolerated treatment without significant acute effects, and was  discharged to home in stable condition.    PLAN: Follow-up in one month.

## 2015-03-23 ENCOUNTER — Telehealth: Payer: Self-pay | Admitting: *Deleted

## 2015-03-23 NOTE — Progress Notes (Signed)
  Radiation Oncology         [phone] ________________________________  Name: Raymond Chaney MRN: [id number]  Date: 03/22/2015  DOB: 02-09-1932   SPECIAL TREATMENT PROCEDURE   3D TREATMENT PLANNING AND DOSIMETRY: The patient's radiation plan was reviewed and approved by Dr. Sherwood Gambler from neurosurgery and radiation oncology prior to treatment. It showed 3-dimensional radiation distributions overlaid onto the planning CT/MRI image set. The [facility] for the target structures as well as the organs at risk were reviewed. The documentation of the 3D plan and dosimetry are filed in the radiation oncology EMR.   NARRATIVE: The patient was brought to the TrueBeam stereotactic radiation treatment machine and placed supine on the CT couch. The head frame was applied, and the patient was set up for stereotactic radiosurgery. Neurosurgery was present for the set-up and delivery   SIMULATION VERIFICATION: In the couch zero-angle position, the patient underwent Exactrac imaging using the Brainlab system with orthogonal KV images. These were carefully aligned and repeated to confirm treatment position for each of the isocenters. The Exactrac snap film verification was repeated at each couch angle.   The patient received stereotactic radiosurgery to the following target:  PTV1 left occipital 44m target was treated using 3 Arcs to a prescription dose of 20 Gy. ExacTrac Snap verification was performed for each couch angle.   STEREOTACTIC TREATMENT MANAGEMENT: Following delivery, the patient was transported to nursing in stable condition and monitored for possible acute effects. Vital signs were recorded . The patient tolerated treatment without significant acute effects, and was discharged to home in stable condition.  PLAN: Follow-up in one month.   ------------------------------------------------  JJodelle Gross MD, PhD

## 2015-03-23 NOTE — Telephone Encounter (Signed)
[facility] [phone] with pharmacist Gillermina Phy, to dispense 31 tablets of 4 mg Dexamethasone(decadron)  Take as directed from tapered directions, patient has enough to finish with these 31 tabs, no refills Per Dr. Lisbeth Renshaw 9:48 AM

## 2015-03-23 NOTE — Progress Notes (Signed)
  Radiation Oncology         [phone] ________________________________  Name: Raymond Chaney MRN: [id number]  Date: 03/22/2015  DOB: August 16, 1932  End of Treatment Note  Diagnosis:   Metastatic lung cancer with brain metastasis     Indication for treatment:  palliative       Radiation treatment dates:   03/22/2015  Site/dose:    The patient received stereotactic radiosurgery to the following target:  PTV1 left occipital 45m target was treated using 3 Arcs to a prescription dose of 20 Gy. ExacTrac Snap verification was performed for each couch angle.    Narrative: The patient tolerated radiation treatment well.   There were no signs of acute toxicity after treatment.  Plan: The patient has completed radiation treatment. The patient will return to radiation oncology clinic for routine followup in one month. I advised the patient to call or return sooner if they have any questions or concerns related to their recovery or treatment. ________________________________  ------------------------------------------------  JJodelle Gross MD, PhD

## 2015-03-23 NOTE — Progress Notes (Signed)
  Radiation Oncology         [phone] ________________________________  Name: Raymond Chaney MRN: [id number]  Date: 03/17/2015  DOB: [birth year]  SIMULATION AND TREATMENT PLANNING NOTE  DIAGNOSIS:  Metastatic lung cancer with brain metastasis  NARRATIVE:  The patient was brought to the [facility] suite.  Identity was confirmed.  All relevant records and images related to the planned course of therapy were reviewed.  The patient freely provided informed written consent to proceed with treatment after reviewing the details related to the planned course of therapy. The consent form was witnessed and verified by the simulation staff. Intravenous access was established for contrast administration. Then, the patient was set-up in a stable reproducible supine position for radiation therapy.  A relocatable thermoplastic stereotactic head frame was fabricated for precise immobilization.  CT images were obtained.  Surface markings were placed.  The CT images were loaded into the planning software and fused with the patient's targeting MRI scan.  Then the target and avoidance structures were contoured.  Treatment planning then occurred.  The radiation prescription was entered and confirmed.  I have requested 3D planning  I have requested a DVH of the following structures: Brain stem, brain, left eye, right I, lenses, optic chiasm, target volumes, uninvolved brain, and normal tissue.    PLAN:  The patient will receive 20 Gy in 1 fraction.   Special treatment procedure The patient will be treated with a course of radiosurgery. This requires extremely precise localization of the target and sophisticated, labor intensive treatment planning to achieve a highly focused radiation treatment plan. Due to the extra work involved in planning and delivery of such a procedure, this corresponds to a special treatment procedure.  ________________________________  Jodelle Gross, MD, PhD

## 2015-03-24 ENCOUNTER — Emergency Department (HOSPITAL_COMMUNITY)
Admission: EM | Admit: 2015-03-24 | Discharge: 2015-03-24 | Disposition: A | Discharge status: Home / Self Care | Payer: Medicare Other | Attending: Emergency Medicine | Admitting: Emergency Medicine

## 2015-03-24 ENCOUNTER — Emergency Department (HOSPITAL_COMMUNITY): Payer: Medicare Other

## 2015-03-24 ENCOUNTER — Telehealth: Payer: Self-pay | Admitting: Oncology

## 2015-03-24 ENCOUNTER — Encounter (HOSPITAL_COMMUNITY): Payer: Self-pay | Admitting: Emergency Medicine

## 2015-03-24 DIAGNOSIS — E119 Type 2 diabetes mellitus without complications: Secondary | ICD-10-CM | POA: Diagnosis not present

## 2015-03-24 DIAGNOSIS — J449 Chronic obstructive pulmonary disease, unspecified: Secondary | ICD-10-CM | POA: Diagnosis not present

## 2015-03-24 DIAGNOSIS — R42 Dizziness and giddiness: Secondary | ICD-10-CM | POA: Diagnosis present

## 2015-03-24 DIAGNOSIS — Z9981 Dependence on supplemental oxygen: Secondary | ICD-10-CM | POA: Diagnosis not present

## 2015-03-24 DIAGNOSIS — N390 Urinary tract infection, site not specified: Secondary | ICD-10-CM | POA: Diagnosis not present

## 2015-03-24 DIAGNOSIS — Z86718 Personal history of other venous thrombosis and embolism: Secondary | ICD-10-CM | POA: Insufficient documentation

## 2015-03-24 DIAGNOSIS — Z79899 Other long term (current) drug therapy: Secondary | ICD-10-CM | POA: Diagnosis not present

## 2015-03-24 DIAGNOSIS — I2510 Atherosclerotic heart disease of native coronary artery without angina pectoris: Secondary | ICD-10-CM | POA: Insufficient documentation

## 2015-03-24 DIAGNOSIS — M1990 Unspecified osteoarthritis, unspecified site: Secondary | ICD-10-CM | POA: Insufficient documentation

## 2015-03-24 DIAGNOSIS — Z87891 Personal history of nicotine dependence: Secondary | ICD-10-CM | POA: Insufficient documentation

## 2015-03-24 DIAGNOSIS — Z880 Allergy status to penicillin: Secondary | ICD-10-CM | POA: Insufficient documentation

## 2015-03-24 DIAGNOSIS — I509 Heart failure, unspecified: Secondary | ICD-10-CM | POA: Diagnosis not present

## 2015-03-24 DIAGNOSIS — Z85841 Personal history of malignant neoplasm of brain: Secondary | ICD-10-CM | POA: Diagnosis not present

## 2015-03-24 DIAGNOSIS — M109 Gout, unspecified: Secondary | ICD-10-CM | POA: Insufficient documentation

## 2015-03-24 DIAGNOSIS — Z8673 Personal history of transient ischemic attack (TIA), and cerebral infarction without residual deficits: Secondary | ICD-10-CM | POA: Diagnosis not present

## 2015-03-24 DIAGNOSIS — G4730 Sleep apnea, unspecified: Secondary | ICD-10-CM | POA: Insufficient documentation

## 2015-03-24 DIAGNOSIS — E785 Hyperlipidemia, unspecified: Secondary | ICD-10-CM | POA: Insufficient documentation

## 2015-03-24 DIAGNOSIS — I10 Essential (primary) hypertension: Secondary | ICD-10-CM | POA: Insufficient documentation

## 2015-03-24 LAB — URINALYSIS, ROUTINE W REFLEX MICROSCOPIC
Bilirubin Urine: NEGATIVE
GLUCOSE, UA: NEGATIVE mg/dL
Hgb urine dipstick: NEGATIVE
Ketones, ur: NEGATIVE mg/dL
Nitrite: NEGATIVE
PROTEIN: NEGATIVE mg/dL
Specific Gravity, Urine: 1.021 (ref 1.005–1.030)
UROBILINOGEN UA: 1 mg/dL (ref 0.0–1.0)
pH: 5 (ref 5.0–8.0)

## 2015-03-24 LAB — BASIC METABOLIC PANEL
Anion gap: 9 (ref 5–15)
BUN: 25 mg/dL — AB (ref 6–20)
CO2: 25 mmol/L (ref 22–32)
Calcium: 8.2 mg/dL — ABNORMAL LOW (ref 8.9–10.3)
Chloride: 101 mmol/L (ref 101–111)
Creatinine, Ser: 0.98 mg/dL (ref 0.61–1.24)
GFR calc non Af Amer: >60 mL/min (ref >60)
GLUCOSE: 188 mg/dL — AB (ref 65–99)
POTASSIUM: 4.7 mmol/L (ref 3.5–5.1)
Sodium: 135 mmol/L (ref 135–145)

## 2015-03-24 LAB — CBC
HEMATOCRIT: 40.5 % (ref 39.0–52.0)
HEMOGLOBIN: 13.5 g/dL (ref 13.0–17.0)
MCH: 31.5 pg (ref 26.0–34.0)
MCHC: 33.3 g/dL (ref 30.0–36.0)
MCV: 94.6 fL (ref 78.0–100.0)
PLATELETS: 106 10*3/uL — AB (ref 150–400)
RBC: 4.28 MIL/uL (ref 4.22–5.81)
RDW: 14.4 % (ref 11.5–15.5)
WBC: 11.9 10*3/uL — ABNORMAL HIGH (ref 4.0–10.5)

## 2015-03-24 LAB — URINE MICROSCOPIC-ADD ON

## 2015-03-24 LAB — CBG MONITORING, ED: GLUCOSE-CAPILLARY: 175 mg/dL — AB (ref 65–99)

## 2015-03-24 MED ORDER — SULFAMETHOXAZOLE-TRIMETHOPRIM 800-160 MG PO TABS: 1.0000 | ORAL_TABLET | Freq: Two times a day (BID) | ORAL | Status: AC

## 2015-03-24 NOTE — Discharge Instructions (Signed)

## 2015-03-24 NOTE — ED Notes (Signed)
The patient has been dizzy today.  He is currently on radiation for brain cancer.  He says he did have a headache but it is gone.  The patient is a GCS=15.  He denies any N/ V or diarrhea.  The patient denies any LOC.

## 2015-03-24 NOTE — ED Notes (Signed)
Pt verbalized understanding of d/c instructions and antibiotic use and has no further questions. GCS 15, pt stable and in NAD.

## 2015-03-24 NOTE — ED Provider Notes (Signed)
CSN: [id number]     Arrival date & time 03/24/15  1754 History   First MD Initiated Contact with Patient 03/24/15 1812     Chief Complaint  Patient presents with  . Dizziness    The patient has been dizzy today.  He is currently on radiation for brain cancer.  He says he did have a headache but it is gone.     HPI Pt had an episode today of dizziness where he felt like his head was vibrating.  It lasted for a minute or so.  The episode resolved but then it started again.  He felt like his vision was blurred as well.  No focal weakness in his arms or legs.  He is not having the symptoms right now.  No LOC.  He called his doctor and was told to come to the ED to have it checked out. Past Medical History  Diagnosis Date  . Arthritis   . CHF (congestive heart failure)   . COPD (chronic obstructive pulmonary disease)   . Hypertension   . Stroke   . Gout   . Alcohol abuse   . GIB (gastrointestinal bleeding)   . Complication of anesthesia     2004   stopped breathing  3 times  on the table "  . Shortness of breath   . Sleep apnea 09/08/2012    sleep study 08/20/12-ild sleep apnea with an AHI of 7.8/hr and durimg REM 27.2/hr: CPAP 10/04/12-tried nasal pillows and face mask, but couldnt tolerate  . Atrial fibrillation     chronic on coumadin; monitor 05/20/11- AFib    . DVT, lower extremity      2005  . Hyperlipidemia   . CAD (coronary artery disease)     myoview 10/19/09-diaphragmatic attenuation vs inferior scar without ischemia; echo 09/11/12-Ef 40-45%, L atrium mod to severely dilated  . Radiation 10/18/14-10/28/14    SBRT right lateral lung mass 50 gray  . Diabetes mellitus without complication   . S/P radiation therapy 01/31/15-02/14/15    Left lower lobe nodule 50GY/62f  . Brain cancer 03/03/15 MRI    2.2cm hemrhagic lesion left occipital lobe   . Allergy     PCNS+Rash  . COPD (chronic obstructive pulmonary disease)    Past Surgical History  Procedure Laterality Date  . Total knee  arthroplasty    . Colon surgery      colon polyps  . Hip surgery    . 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 Cardiac catheterization  10/11/02    noncritical CAD, EF 45-50%  . Cardiac catheterization  08/22/99    no significant CAD, nl EF   Family History  Problem Relation Age of Onset  . Heart attack Father   . Diabetes Father   . Dementia Mother   . Cancer Sister   . Cancer Brother   . Heart disease Brother   . Cancer Brother   . Cancer Brother   . Cancer Sister    History  Substance Use Topics  . Smoking status: Former Smoker    Quit date: 09/08/1984  . Smokeless tobacco: Former [employer] . Alcohol Use: No     Comment: HX of Alcohol abuse    Quit  in 2004    Review of Systems    Allergies  Penicillins  Home Medications   Prior to Admission medications   Medication Sig Start Date End Date Taking? Authorizing  Provider  allopurinol (ZYLOPRIM) 100 MG tablet Take 100 mg by mouth daily.   Yes Historical Provider, MD  Ascorbic Acid (VITAMIN C) 1000 MG tablet Take 1,000 mg by mouth daily.   Yes Historical Provider, MD  dexamethasone (DECADRON) 4 MG tablet Take 1 tablet (4 mg total) by mouth every 6 (six) hours. 03/10/15  Yes Kyung Rudd, MD  doxazosin (CARDURA) 4 MG tablet Take 4 mg by mouth daily.   Yes Historical Provider, MD  fluticasone (FLONASE) 50 MCG/ACT nasal spray Place 2 sprays into the nose daily as needed for allergies.  09/13/14  Yes Historical Provider, MD  lisinopril (PRINIVIL,ZESTRIL) 10 MG tablet Take 10 mg by mouth daily. 03/16/15  Yes Historical Provider, MD  metFORMIN (GLUCOPHAGE) 1000 MG tablet Take 1,000 mg by mouth daily with breakfast.   Yes Historical Provider, MD  metoprolol succinate (TOPROL-XL) 50 MG 24 hr tablet Take 1 tablet (50 mg total) by mouth daily. 03/22/15  Yes Lorretta Harp, MD  pravastatin (PRAVACHOL) 80 MG tablet Take 80 mg by mouth daily.   Yes Historical Provider, MD   acetaminophen (TYLENOL) 325 MG tablet Take 2 tablets (650 mg total) by mouth every 4 (four) hours as needed for mild pain (or temp > 99 F). Patient not taking: Reported on 03/16/2015 03/05/15   Shanon Brow L Rinehuls, PA-C  mirabegron ER (MYRBETRIQ) 50 MG TB24 tablet Take 50 mg by mouth daily.    Historical Provider, MD  pantoprazole (PROTONIX) 40 MG tablet Take 1 tablet (40 mg total) by mouth at bedtime. Patient not taking: Reported on 03/24/2015 03/05/15   Shanon Brow L Rinehuls, PA-C  sulfamethoxazole-trimethoprim (BACTRIM DS,SEPTRA DS) 800-160 MG per tablet Take 1 tablet by mouth 2 (two) times daily. 03/24/15 03/31/15  Dorie Rank, MD   BP 104/62 mmHg  Pulse 103  Temp(Src) 97.4 F (36.3 C) (Oral)  Resp 12  SpO2 93% Physical Exam  Constitutional: He is oriented to person, place, and time. He appears well-developed and well-nourished. No distress.  HENT:  Head: Normocephalic and atraumatic.  Right Ear: External ear normal.  Left Ear: External ear normal.  Mouth/Throat: Oropharynx is clear and moist.  Eyes: Conjunctivae are normal. Right eye exhibits no discharge. Left eye exhibits no discharge. No scleral icterus.  Neck: Neck supple. No tracheal deviation present.  Cardiovascular: Normal rate, regular rhythm and intact distal pulses.   Pulmonary/Chest: Effort normal and breath sounds normal. No stridor. No respiratory distress. He has no wheezes. He has no rales.  Abdominal: Soft. Bowel sounds are normal. He exhibits no distension. There is no tenderness. There is no rebound and no guarding.  Musculoskeletal: He exhibits no edema or tenderness.  Neurological: He is alert and oriented to person, place, and time. He has normal strength. No cranial nerve deficit (No facial droop, extraocular movements intact, tongue midline ) or sensory deficit. He exhibits normal muscle tone. He displays no seizure activity. Coordination normal.  No pronator drift bilateral upper extrem, able to hold both legs off bed for 5  seconds, sensation intact in all extremities, no visual field cuts, no left or right sided neglect, normal finger-nose exam bilaterally, no nystagmus noted   Skin: Skin is warm and dry. No rash noted.  Psychiatric: He has a normal mood and affect.  Nursing note and vitals reviewed.   ED Course  Procedures (including critical care time) Labs Review Labs Reviewed  BASIC METABOLIC PANEL - Abnormal; Notable for the following:    Glucose, Bld 188 (*)  BUN 25 (*)    Calcium 8.2 (*)    All other components within normal limits  CBC - Abnormal; Notable for the following:    WBC 11.9 (*)    Platelets 106 (*)    All other components within normal limits  URINALYSIS, ROUTINE W REFLEX MICROSCOPIC (NOT AT [facility]) - Abnormal; Notable for the following:    Leukocytes, UA MODERATE (*)    All other components within normal limits  URINE MICROSCOPIC-ADD ON - Abnormal; Notable for the following:    Squamous Epithelial / LPF FEW (*)    Bacteria, UA FEW (*)    All other components within normal limits  CBG MONITORING, ED - Abnormal; Notable for the following:    Glucose-Capillary 175 (*)    All other components within normal limits  URINE CULTURE    Imaging Review Ct Head Wo Contrast  03/24/2015   CLINICAL DATA:  History of lung cancer with brain metastasis  EXAM: CT HEAD WITHOUT CONTRAST  TECHNIQUE: Contiguous axial images were obtained from the base of the skull through the vertex without intravenous contrast.  COMPARISON:  03/16/2015  FINDINGS: The hemorrhagic lesion within the medial left occipital lobe is again noted an is unchanged in size measuring 1.8 cm, image 18/series 2. No additional brain lesions identified. Encephalomalacia and volume loss from the right cerebellar hemisphere is again noted and appears unchanged. There is prominence of the sulci and ventricles compatible with brain atrophy. No abnormal extra-axial fluid collections, intracranial hemorrhage or mass. Retention cyst versus  polyp noted within the left maxillary sinus. The mastoid air cells are clear. The calvarium appears intact. There is an old burr hole defect identified within the posterior right parietal bone.  IMPRESSION: 1. Stable size of hemorrhagic lesion involving the medial left occipital lobe. 2. Chronic right cerebellar hemisphere infarct. 3. Atrophy.   Electronically Signed   By: Kerby Moors M.D.   On: 03/24/2015 19:21     EKG Interpretation   Date/Time:  Friday March 24 2015 17:58:59 EDT Ventricular Rate:  106 PR Interval:    QRS Duration: 86 QT Interval:  306 QTC Calculation: 406 R Axis:   67 Text Interpretation:  Atrial fibrillation Septal infarct , age  undetermined Abnormal ECG No significant change since last tracing  Confirmed by Mirabel Ahlgren  MD-J, Chandon Lazcano ([id number]) on 03/24/2015 6:39:33 PM      MDM   Final diagnoses:  Dizziness  UTI (lower urinary tract infection)    No acute findings noted on head CT.  Neuro exam is reassuring.  No clear indication for the symptoms he described.  Possible uti noted.  Will send off culture, rx abx.  Follow up with PCP    Dorie Rank, MD 03/24/15 2126

## 2015-03-24 NOTE — Telephone Encounter (Addendum)
Called Raymond Chaney back.  He said he has "a bad vibration in his head and his eyes are going dim."  He said his eyes going dim last for a few minutes and then get better.  He says it has happened twice.  He also called another docter and was told to go the ER.  He is on his way to [facility] ER.  Advised him that Dr. Lisbeth Renshaw will be notified.  Mr. Raymond Chaney verbalized understanding.

## 2015-03-25 ENCOUNTER — Emergency Department (HOSPITAL_COMMUNITY): Payer: Medicare Other

## 2015-03-25 ENCOUNTER — Encounter (HOSPITAL_COMMUNITY): Payer: Self-pay | Admitting: Physical Medicine and Rehabilitation

## 2015-03-25 ENCOUNTER — Emergency Department (HOSPITAL_COMMUNITY)
Admission: EM | Admit: 2015-03-25 | Discharge: 2015-03-25 | Disposition: A | Discharge status: Home / Self Care | Payer: Medicare Other | Attending: Emergency Medicine | Admitting: Emergency Medicine

## 2015-03-25 DIAGNOSIS — Z87891 Personal history of nicotine dependence: Secondary | ICD-10-CM | POA: Insufficient documentation

## 2015-03-25 DIAGNOSIS — I10 Essential (primary) hypertension: Secondary | ICD-10-CM | POA: Diagnosis not present

## 2015-03-25 DIAGNOSIS — R42 Dizziness and giddiness: Secondary | ICD-10-CM

## 2015-03-25 DIAGNOSIS — E785 Hyperlipidemia, unspecified: Secondary | ICD-10-CM | POA: Insufficient documentation

## 2015-03-25 DIAGNOSIS — Z8673 Personal history of transient ischemic attack (TIA), and cerebral infarction without residual deficits: Secondary | ICD-10-CM | POA: Insufficient documentation

## 2015-03-25 DIAGNOSIS — Z79899 Other long term (current) drug therapy: Secondary | ICD-10-CM | POA: Insufficient documentation

## 2015-03-25 DIAGNOSIS — Z9981 Dependence on supplemental oxygen: Secondary | ICD-10-CM | POA: Diagnosis not present

## 2015-03-25 DIAGNOSIS — C7800 Secondary malignant neoplasm of unspecified lung: Secondary | ICD-10-CM | POA: Insufficient documentation

## 2015-03-25 DIAGNOSIS — Z043 Encounter for examination and observation following other accident: Secondary | ICD-10-CM | POA: Diagnosis present

## 2015-03-25 DIAGNOSIS — Z9889 Other specified postprocedural states: Secondary | ICD-10-CM | POA: Insufficient documentation

## 2015-03-25 DIAGNOSIS — I2510 Atherosclerotic heart disease of native coronary artery without angina pectoris: Secondary | ICD-10-CM | POA: Insufficient documentation

## 2015-03-25 DIAGNOSIS — I509 Heart failure, unspecified: Secondary | ICD-10-CM | POA: Insufficient documentation

## 2015-03-25 DIAGNOSIS — I4891 Unspecified atrial fibrillation: Secondary | ICD-10-CM | POA: Insufficient documentation

## 2015-03-25 DIAGNOSIS — D696 Thrombocytopenia, unspecified: Secondary | ICD-10-CM | POA: Insufficient documentation

## 2015-03-25 DIAGNOSIS — E119 Type 2 diabetes mellitus without complications: Secondary | ICD-10-CM | POA: Insufficient documentation

## 2015-03-25 DIAGNOSIS — M109 Gout, unspecified: Secondary | ICD-10-CM | POA: Diagnosis not present

## 2015-03-25 DIAGNOSIS — Z880 Allergy status to penicillin: Secondary | ICD-10-CM | POA: Diagnosis not present

## 2015-03-25 DIAGNOSIS — J449 Chronic obstructive pulmonary disease, unspecified: Secondary | ICD-10-CM | POA: Insufficient documentation

## 2015-03-25 DIAGNOSIS — C7931 Secondary malignant neoplasm of brain: Secondary | ICD-10-CM | POA: Insufficient documentation

## 2015-03-25 DIAGNOSIS — C3490 Malignant neoplasm of unspecified part of unspecified bronchus or lung: Secondary | ICD-10-CM

## 2015-03-25 DIAGNOSIS — Z86718 Personal history of other venous thrombosis and embolism: Secondary | ICD-10-CM | POA: Diagnosis not present

## 2015-03-25 DIAGNOSIS — G4730 Sleep apnea, unspecified: Secondary | ICD-10-CM | POA: Diagnosis not present

## 2015-03-25 DIAGNOSIS — M1990 Unspecified osteoarthritis, unspecified site: Secondary | ICD-10-CM | POA: Diagnosis not present

## 2015-03-25 DIAGNOSIS — R0602 Shortness of breath: Secondary | ICD-10-CM

## 2015-03-25 LAB — COMPREHENSIVE METABOLIC PANEL
ALBUMIN: 2.6 g/dL — AB (ref 3.5–5.0)
ALT: 31 U/L (ref 17–63)
ANION GAP: 10 (ref 5–15)
AST: 25 U/L (ref 15–41)
Alkaline Phosphatase: 51 U/L (ref 38–126)
BUN: 24 mg/dL — ABNORMAL HIGH (ref 6–20)
CALCIUM: 8.4 mg/dL — AB (ref 8.9–10.3)
CO2: 27 mmol/L (ref 22–32)
Chloride: 103 mmol/L (ref 101–111)
Creatinine, Ser: 1.02 mg/dL (ref 0.61–1.24)
GFR calc Af Amer: >60 mL/min (ref >60)
GFR calc non Af Amer: >60 mL/min (ref >60)
Glucose, Bld: 151 mg/dL — ABNORMAL HIGH (ref 65–99)
Potassium: 4.6 mmol/L (ref 3.5–5.1)
SODIUM: 140 mmol/L (ref 135–145)
TOTAL PROTEIN: 4.7 g/dL — AB (ref 6.5–8.1)
Total Bilirubin: 0.6 mg/dL (ref 0.3–1.2)

## 2015-03-25 LAB — CBC
HEMATOCRIT: 40.2 % (ref 39.0–52.0)
Hemoglobin: 13 g/dL (ref 13.0–17.0)
MCH: 31.2 pg (ref 26.0–34.0)
MCHC: 32.3 g/dL (ref 30.0–36.0)
MCV: 96.4 fL (ref 78.0–100.0)
Platelets: 90 10*3/uL — ABNORMAL LOW (ref 150–400)
RBC: 4.17 MIL/uL — ABNORMAL LOW (ref 4.22–5.81)
RDW: 14.4 % (ref 11.5–15.5)
WBC: 11.5 10*3/uL — ABNORMAL HIGH (ref 4.0–10.5)

## 2015-03-25 LAB — TROPONIN I: TROPONIN I: 0.05 ng/mL — AB (ref <0.031)

## 2015-03-25 MED ORDER — ZOLPIDEM TARTRATE 5 MG PO TABS: 5.0000 mg | ORAL_TABLET | Freq: Every evening | ORAL | Status: AC | PRN

## 2015-03-25 MED ORDER — SODIUM CHLORIDE 0.9 % IV BOLUS (SEPSIS)
500.0000 mL | Freq: Once | INTRAVENOUS | Status: AC
  Administered 2015-03-25: 500 mL via INTRAVENOUS

## 2015-03-25 NOTE — ED Notes (Signed)
Fluids offered.  

## 2015-03-25 NOTE — ED Provider Notes (Addendum)
CSN: [id number]     Arrival date & time 03/25/15  1059 History   First MD Initiated Contact with Patient 03/25/15 1104     Chief Complaint  Patient presents with  . Near Syncope  . Fall     (Consider location/radiation/quality/duration/timing/severity/associated sxs/prior Treatment) Patient is a 81 y.o. male presenting with near-syncope and fall. The history is provided by the patient.  Near Syncope Associated symptoms include shortness of breath. Pertinent negatives include no chest pain, no abdominal pain and no headaches.  Fall Associated symptoms include shortness of breath. Pertinent negatives include no chest pain, no abdominal pain and no headaches.  Patient w hx metastatic lung ca, presents w episode feeling dizzy, which he describes as becoming faint/lightheaded when walking into [employer]. Pt indicates was in ED yesterday w similar episode - was dx w possible uti, and took first dose abx this AM. Denies dysuria or gu c/o. No loc/syncope. No fall. Denies headache. No change in vision or speech. No numbness or weakness. Denies chest pain or discomfort. No sob. Non prod cough, states w lung ca has felt sob for the past few months, no acute or abrupt change today. Has afib, was on anticoa, stopped a couple weeks ago due to intraparenchymal hemorrhage. Denies palpitations or sense of rapid heartbeat. No abd pain. No nvd. No recent blood loss or melena. No fever or chills. Compliant w meds, denies recent change (other than recent d/c coumadin)     Past Medical History  Diagnosis Date  . Arthritis   . CHF (congestive heart failure)   . COPD (chronic obstructive pulmonary disease)   . Hypertension   . Stroke   . Gout   . Alcohol abuse   . GIB (gastrointestinal bleeding)   . Complication of anesthesia     2004   stopped breathing  3 times  on the table "  . Shortness of breath   . Sleep apnea 09/08/2012    sleep study 08/20/12-ild sleep apnea with an AHI of 7.8/hr and durimg REM  27.2/hr: CPAP 10/04/12-tried nasal pillows and face mask, but couldnt tolerate  . Atrial fibrillation     chronic on coumadin; monitor 05/20/11- AFib    . DVT, lower extremity      2005  . Hyperlipidemia   . CAD (coronary artery disease)     myoview 10/19/09-diaphragmatic attenuation vs inferior scar without ischemia; echo 09/11/12-Ef 40-45%, L atrium mod to severely dilated  . Radiation 10/18/14-10/28/14    SBRT right lateral lung mass 50 gray  . Diabetes mellitus without complication   . S/P radiation therapy 01/31/15-02/14/15    Left lower lobe nodule 50GY/16f  . Brain cancer 03/03/15 MRI    2.2cm hemrhagic lesion left occipital lobe   . Allergy     PCNS+Rash  . COPD (chronic obstructive pulmonary disease)    Past Surgical History  Procedure Laterality Date  . Total knee arthroplasty    . Colon surgery      colon polyps  . Hip surgery    . 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 Cardiac catheterization  10/11/02    noncritical CAD, EF 45-50%  . Cardiac catheterization  08/22/99    no significant CAD, nl EF   Family History  Problem Relation Age of Onset  . Heart attack Father   . Diabetes Father   . Dementia Mother   . Cancer Sister   . Cancer Brother   .  Heart disease Brother   . Cancer Brother   . Cancer Brother   . Cancer Sister    History  Substance Use Topics  . Smoking status: Former Smoker    Quit date: 09/08/1984  . Smokeless tobacco: Former Systems developer  . Alcohol Use: No    Review of Systems  Constitutional: Negative for fever and chills.  HENT: Negative for sore throat.   Eyes: Negative for visual disturbance.  Respiratory: Positive for cough and shortness of breath.   Cardiovascular: Positive for near-syncope. Negative for chest pain, palpitations and leg swelling.  Gastrointestinal: Negative for vomiting, abdominal pain, diarrhea and blood in stool.  Endocrine: Negative for polyuria.  Genitourinary:  Negative for dysuria and flank pain.  Musculoskeletal: Negative for back pain and neck pain.  Skin: Negative for rash.  Neurological: Negative for speech difficulty, weakness, numbness and headaches.  Hematological: Does not bruise/bleed easily.  Psychiatric/Behavioral: Negative for confusion.      Allergies  Penicillins  Home Medications   Prior to Admission medications   Medication Sig Start Date End Date Taking? Authorizing Provider  acetaminophen (TYLENOL) 325 MG tablet Take 2 tablets (650 mg total) by mouth every 4 (four) hours as needed for mild pain (or temp > 99 F). Patient not taking: Reported on 03/16/2015 03/05/15   Shanon Brow L Rinehuls, PA-C  allopurinol (ZYLOPRIM) 100 MG tablet Take 100 mg by mouth daily.    Historical Provider, MD  Ascorbic Acid (VITAMIN C) 1000 MG tablet Take 1,000 mg by mouth daily.    Historical Provider, MD  dexamethasone (DECADRON) 4 MG tablet Take 1 tablet (4 mg total) by mouth every 6 (six) hours. 03/10/15   Kyung Rudd, MD  doxazosin (CARDURA) 4 MG tablet Take 4 mg by mouth daily.    Historical Provider, MD  fluticasone (FLONASE) 50 MCG/ACT nasal spray Place 2 sprays into the nose daily as needed for allergies.  09/13/14   Historical Provider, MD  lisinopril (PRINIVIL,ZESTRIL) 10 MG tablet Take 10 mg by mouth daily. 03/16/15   Historical Provider, MD  metFORMIN (GLUCOPHAGE) 1000 MG tablet Take 1,000 mg by mouth daily with breakfast.    Historical Provider, MD  metoprolol succinate (TOPROL-XL) 50 MG 24 hr tablet Take 1 tablet (50 mg total) by mouth daily. 03/22/15   Lorretta Harp, MD  mirabegron ER (MYRBETRIQ) 50 MG TB24 tablet Take 50 mg by mouth daily.    Historical Provider, MD  pantoprazole (PROTONIX) 40 MG tablet Take 1 tablet (40 mg total) by mouth at bedtime. Patient not taking: Reported on 03/24/2015 03/05/15   Shanon Brow L Rinehuls, PA-C  pravastatin (PRAVACHOL) 80 MG tablet Take 80 mg by mouth daily.    Historical Provider, MD  sulfamethoxazole-trimethoprim  (BACTRIM DS,SEPTRA DS) 800-160 MG per tablet Take 1 tablet by mouth 2 (two) times daily. 03/24/15 03/31/15  Dorie Rank, MD   BP 114/61 mmHg  Pulse 108  Temp(Src) 97.5 F (36.4 C) (Oral)  Resp 16  SpO2 97% Physical Exam  Constitutional: He is oriented to person, place, and time. He appears well-developed and well-nourished. No distress.  HENT:  Head: Atraumatic.  Mouth/Throat: Oropharynx is clear and moist.  Eyes: Conjunctivae are normal. Pupils are equal, round, and reactive to light. No scleral icterus.  Neck: Neck supple. No tracheal deviation present.  No bruit  Cardiovascular: Normal rate, normal heart sounds and intact distal pulses.  Exam reveals no gallop and no friction rub.   No murmur heard. Irregular rhythm, mildly tachycardic.   Pulmonary/Chest: Effort  normal and breath sounds normal. No accessory muscle usage. No respiratory distress.  Abdominal: Soft. Bowel sounds are normal. He exhibits no distension and no mass. There is no tenderness. There is no rebound and no guarding.  Genitourinary:  No cva tenderness  Musculoskeletal: Normal range of motion. He exhibits no edema or tenderness.  Neurological: He is alert and oriented to person, place, and time.  Motor intact bil, stre 5/5. sens grossly intact.   Skin: Skin is warm and dry. No rash noted. He is not diaphoretic.  Psychiatric: He has a normal mood and affect.  Nursing note and vitals reviewed.   ED Course  Procedures (including critical care time) Labs Review  Results for orders placed or performed during the hospital encounter of 03/25/15  CBC  Result Value Ref Range   WBC 11.5 (H) 4.0 - 10.5 K/uL   RBC 4.17 (L) 4.22 - 5.81 MIL/uL   Hemoglobin 13.0 13.0 - 17.0 g/dL   HCT 40.2 39.0 - 52.0 %   MCV 96.4 78.0 - 100.0 fL   MCH 31.2 26.0 - 34.0 pg   MCHC 32.3 30.0 - 36.0 g/dL   RDW 14.4 11.5 - 15.5 %   Platelets 90 (L) 150 - 400 K/uL  Comprehensive metabolic panel  Result Value Ref Range   Sodium 140 135 -  145 mmol/L   Potassium 4.6 3.5 - 5.1 mmol/L   Chloride 103 101 - 111 mmol/L   CO2 27 22 - 32 mmol/L   Glucose, Bld 151 (H) 65 - 99 mg/dL   BUN 24 (H) 6 - 20 mg/dL   Creatinine, Ser 1.02 0.61 - 1.24 mg/dL   Calcium 8.4 (L) 8.9 - 10.3 mg/dL   Total Protein 4.7 (L) 6.5 - 8.1 g/dL   Albumin 2.6 (L) 3.5 - 5.0 g/dL   AST 25 15 - 41 U/L   ALT 31 17 - 63 U/L   Alkaline Phosphatase 51 38 - 126 U/L   Total Bilirubin 0.6 0.3 - 1.2 mg/dL   GFR calc non Af Amer >60 >60 mL/min   GFR calc Af Amer >60 >60 mL/min   Anion gap 10 5 - 15  Troponin I  Result Value Ref Range   Troponin I 0.05 (H) <0.031 ng/mL   Dg Chest 2 View  03/25/2015   CLINICAL DATA:  Near syncope  EXAM: CHEST  2 VIEW  COMPARISON:  01/11/2015  FINDINGS: The heart size and mediastinal contours are within normal limits. Both lungs are clear. Multi level thoracic spondylosis noted.  IMPRESSION: No active cardiopulmonary disease.   Electronically Signed   By: Kerby Moors M.D.   On: 03/25/2015 12:05   Ct Head Wo Contrast  03/24/2015   CLINICAL DATA:  History of lung cancer with brain metastasis  EXAM: CT HEAD WITHOUT CONTRAST  TECHNIQUE: Contiguous axial images were obtained from the base of the skull through the vertex without intravenous contrast.  COMPARISON:  03/16/2015  FINDINGS: The hemorrhagic lesion within the medial left occipital lobe is again noted an is unchanged in size measuring 1.8 cm, image 18/series 2. No additional brain lesions identified. Encephalomalacia and volume loss from the right cerebellar hemisphere is again noted and appears unchanged. There is prominence of the sulci and ventricles compatible with brain atrophy. No abnormal extra-axial fluid collections, intracranial hemorrhage or mass. Retention cyst versus polyp noted within the left maxillary sinus. The mastoid air cells are clear. The calvarium appears intact. There is an old burr hole defect identified within  the posterior right parietal bone.  IMPRESSION: 1.  Stable size of hemorrhagic lesion involving the medial left occipital lobe. 2. Chronic right cerebellar hemisphere infarct. 3. Atrophy.   Electronically Signed   By: Kerby Moors M.D.   On: 03/24/2015 19:21   Ct Head Wo Contrast  03/02/2015   CLINICAL DATA:  Frontal headache for 14 days. History of lung cancer.  EXAM: CT HEAD WITHOUT CONTRAST  TECHNIQUE: Contiguous axial images were obtained from the base of the skull through the vertex without intravenous contrast.  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 No evidence of osseous metastatic disease.  Orbits: No acute abnormality.  Brain: 18 mm hematoma in the left parietal white matter around the lateral ventricle. There is a fluid hematocrit levels suggesting intracystic hemorrhage. Small volume hemorrhage present in the occipital horn of the left lateral ventricle which is not obstructed. There is neighboring vasogenic edema. No additional mass lesion is seen.  Remote right superior cerebellum infarction with gliosis.  Generalized brain atrophy.  Critical Value/emergent results were called by telephone at the time of interpretation on 03/02/2015 at 2:20 pm to Dr. Prince Solian , who verbally acknowledged these results. The patient will be sent for workup at [facility] ED.  IMPRESSION: 19 mm hematoma in the left parietal white matter with small intraventricular extension. Recommend updated brain MRI with contrast as this is primarily concerning for new metastatic disease.   Electronically Signed   By: Monte Fantasia M.D.   On: 03/02/2015 14:31   Mr [name] ZO Contrast  03/16/2015   CLINICAL DATA:  Lung cancer. Brain metastases. Hemorrhagic left occipital lobe lesion.  EXAM: MRI HEAD WITHOUT AND WITH CONTRAST  TECHNIQUE: Multiplanar, multiecho pulse sequences of the brain and surrounding structures were obtained without and with intravenous contrast.  CONTRAST:  37m MULTIHANCE GADOBENATE DIMEGLUMINE 529  MG/ML IV SOLN  COMPARISON:  MRI brain 03/03/2015 and 10/10/2014.  FINDINGS: The hemorrhagic lesion within the medial left occipital lobe is stable to slightly decreased in size on the T1 weighted images, now measuring 13 x 18 x 17 mm. Enhancement is again noted with this lesion.  No other foci of parenchymal enhancement are evident. The study is mildly degraded by patient motion which could obscure very small lesions.  A remote right superior cerebellar hemorrhagic infarct is again noted without change.  No other foci of hemorrhage are evident.  Surrounding vasogenic edema in the left occipital lobe is stable to slightly decreased since the prior exam.  Mild generalized atrophy is otherwise stable. The ventricles are proportionate to the degree of atrophy.  Flow is present in the major intracranial arteries. Bilateral lens replacements are noted. Opacification of posterior left ethmoid air cells is stable a polyp or mucous retention cyst in the left maxillary sinus is stable. The mastoid air cells are clear.  IMPRESSION: 1. Stable to slight decrease in size of hemorrhagic lesion in the medial left occipital lobe, now measuring 13 x 18 x 17 mm. 2. Enhancement associated with the area of hemorrhage suggest underlying solitary metastasis. 3. Remote right superior cerebellar hemorrhagic infarct. 4. Otherwise stable atrophy.   Electronically Signed   By: CSan MorelleM.D.   On: 03/16/2015 11:27   Mr BJeri CosWXWContrast  03/03/2015   CLINICAL DATA:  Headache for 10 days. History of lung cancer. Cerebral hematoma on recent head CT.  EXAM: MRI HEAD WITHOUT AND WITH CONTRAST  TECHNIQUE: Multiplanar, multiecho pulse sequences  of the brain and surrounding structures were obtained without and with intravenous contrast.  CONTRAST:  7m MULTIHANCE GADOBENATE DIMEGLUMINE 529 MG/ML IV SOLN  COMPARISON:  Head CT 03/02/2015 and MRI 10/10/2014  FINDINGS: There is no acute infarct, midline shift, or extra-axial fluid  collection. A chronic infarct is again noted in the superior right cerebellum with chronic blood products. A tiny, chronic cortical/subcortical infarct is again noted in the right parietal lobe. There is mild-to-moderate cerebral atrophy. Small foci of T2 hyperintensity in the subcortical and deep cerebral white matter bilaterally are nonspecific but compatible with mild chronic small vessel ischemic disease.  As described on recent CT, there is a focus of parenchymal hemorrhage in the left occipital lobe which abuts the lateral margin of the occipital horn of the left lateral ventricle. The lesion measures 2.2 x 1.6 cm and demonstrates mildly thick and slightly irregular peripheral enhancement with mild surrounding vasogenic edema. A small amount of blood products are noted dependently in the left greater than right occipital horns. A small left frontal developmental venous anomaly is noted. No other focal enhancing brain lesions are identified.  Prior bilateral cataract extraction is noted. Left maxillary sinus mucous retention cysts are noted. Mastoid air cells are clear. Major intracranial vascular flow voids are preserved.  IMPRESSION: 2.2 cm hemorrhagic lesion in the left occipital lobe with mild surrounding edema. Mildly thick and irregular peripheral enhancement of the lesion is most concerning for a solitary hemorrhagic brain metastasis rather than a benign cerebral hematoma.   Electronically Signed   By: ALogan Bores  On: 03/03/2015 19:59       EKG Interpretation   Date/Time:  Saturday March 25 2015 11:09:53 EDT Ventricular Rate:  119 PR Interval:    QRS Duration: 87 QT Interval:  335 QTC Calculation: 471 R Axis:   57 Text Interpretation:  Atrial fibrillation with frequent Premature  ventricular complexes No significant change since last tracing Confirmed  by Gretta Samons  MD, KLennette Bihari([id number] on 03/25/2015 11:16:07 AM      MDM   Iv ns. Continuous pulse ox and monitor. o2 [facility]. Labs.  Reviewed  nursing notes and prior charts for additional history.   Recheck pt, pt denies any current or recent chest pain or discomfort.  No dysrhythmia on monitor except for pvcs.  Pt states feels breathing at current/recent baseline.  Pt has drank fluids, and ambulate about ED - pt indicates he feels fine, at baseline, and is ready for d/c.  Spouse is present who will be with patient, indicates pt appears at baseline.  Afeb. bp normal. Continues to deny recurrence of any dizziness or faintness. No fever/chills. No cp or discomfort.  Pt w multiple medical problems, incl recent hem cva, lung ca w brain met, afib unable to be on anticoag due to recent hem - pts short and long term prognosis is very poor, however currently appears at his recent baseline, and feels ready to go home.  At d/c pt requests sleep aide, states long standing insomnia.  I rec mild, otc med such as tylenol pm initially, pt inquires of rx - will given small quantity ambien 5 mg if milder/otc meds fail to help.   Pharm tech also notes some confusion as to which metoprolol pt should be taking, shorter or long acting, pt indicates has rx, will have f/u w pcp to clarify.     KLajean Saver MD 03/25/15 [id number]

## 2015-03-25 NOTE — ED Notes (Addendum)
IV attempt failed x3. Phlebotomy at bedside, pt still has a 24 gauge in left shoulder that was placed by EMS

## 2015-03-25 NOTE — ED Notes (Signed)
Pt presents to department via [facility] for evaluation of near syncope. Pt reports he was walking into [employer] this morning when he became sweaty and dizzy, then fell to ground. Denies LOC. History of atrial fibrillation and brain cancer. Recently taken off blood thinner. CBG 125. Pt is alert and oriented x4. 24g L upper chest.

## 2015-03-25 NOTE — Discharge Instructions (Signed)
It was our pleasure to provide your ER care today - we hope that you feel better.  Use caution to avoid falling.   Rest. Drink adequate fluids.  Follow up with your primary care doctor for recheck in the next few days - call office Monday morning to arrange that follow up.  When you contact your doctor, also have them clarify which preparation of the metoprolol they want you to continue taking, and at what dose.   For sleep, you may try a mild over-the-counter sleep aide such as tylenol PM.  If that fails to help with sleep, you may try taking ambien as need - will cause drowsiness, no driving when taking.  Return to ER right away if worse, new symptoms, fevers, fast heart beat, chest pain, fainting/weak, trouble breathing, other concern.     Dizziness Dizziness is a common problem. It is a feeling of unsteadiness or light-headedness. You may feel like you are about to faint. Dizziness can lead to injury if you stumble or fall. A person of any age group can suffer from dizziness, but dizziness is more common in older adults. CAUSES  Dizziness can be caused by many different things, including:  Middle ear problems.  Standing for too long.  Infections.  An allergic reaction.  Aging.  An emotional response to something, such as the sight of blood.  Side effects of medicines.  Tiredness.  Problems with circulation or blood pressure.  Excessive use of alcohol or medicines, or illegal drug use.  Breathing too fast (hyperventilation).  An irregular heart rhythm (arrhythmia).  A low red blood cell count (anemia).  Pregnancy.  Vomiting, diarrhea, fever, or other illnesses that cause body fluid loss (dehydration).  Diseases or conditions such as Parkinson's disease, high blood pressure (hypertension), diabetes, and thyroid problems.  Exposure to extreme heat. DIAGNOSIS  Your health care provider will ask about your symptoms, perform a physical exam, and perform an  electrocardiogram (ECG) to record the electrical activity of your heart. Your health care provider may also perform other heart or blood tests to determine the cause of your dizziness. These may include:  Transthoracic echocardiogram (TTE). During echocardiography, sound waves are used to evaluate how blood flows through your heart.  Transesophageal echocardiogram (TEE).  Cardiac monitoring. This allows your health care provider to monitor your heart rate and rhythm in real time.  Holter monitor. This is a portable device that records your heartbeat and can help diagnose heart arrhythmias. It allows your health care provider to track your heart activity for several days if needed.  Stress tests by exercise or by giving medicine that makes the heart beat faster. TREATMENT  Treatment of dizziness depends on the cause of your symptoms and can vary greatly. HOME CARE INSTRUCTIONS   Drink enough fluids to keep your urine clear or pale yellow. This is especially important in very hot weather. In older adults, it is also important in cold weather.  Take your medicine exactly as directed if your dizziness is caused by medicines. When taking blood pressure medicines, it is especially important to get up slowly.  Rise slowly from chairs and steady yourself until you feel okay.  In the morning, first sit up on the side of the bed. When you feel okay, stand slowly while holding onto something until you know your balance is fine.  Move your legs often if you need to stand in one place for a long time. Tighten and relax your muscles in your legs while  standing.  Have someone stay with you for 1-2 days if dizziness continues to be a problem. Do this until you feel you are well enough to stay alone. Have the person call your health care provider if he or she notices changes in you that are concerning.  Do not drive or use heavy machinery if you feel dizzy.  Do not drink alcohol. SEEK IMMEDIATE MEDICAL  CARE IF:   Your dizziness or light-headedness gets worse.  You feel nauseous or vomit.  You have problems talking, walking, or using your arms, hands, or legs.  You feel weak.  You are not thinking clearly or you have trouble forming sentences. It may take a friend or family member to notice this.  You have chest pain, abdominal pain, shortness of breath, or sweating.  Your vision changes.  You notice any bleeding.  You have side effects from medicine that seems to be getting worse rather than better. MAKE SURE YOU:   Understand these instructions.  Will watch your condition.  Will get help right away if you are not doing well or get worse. Document Released: 02/19/2001 Document Revised: 08/31/2013 Document Reviewed: 03/15/2011 [facility] Patient Information 2015 [place], [place]. This information is not intended to replace advice given to you by your health care provider. Make sure you discuss any questions you have with your health care provider.     Atrial Fibrillation Atrial fibrillation is a type of irregular heart rhythm (arrhythmia). During atrial fibrillation, the upper chambers of the heart (atria) quiver continuously in a chaotic pattern. This causes an irregular and often rapid heart rate.  Atrial fibrillation is the result of the heart becoming overloaded with disorganized signals that tell it to beat. These signals are normally released one at a time by a part of the right atrium called the sinoatrial node. They then travel from the atria to the lower chambers of the heart (ventricles), causing the atria and ventricles to contract and pump blood as they pass. In atrial fibrillation, parts of the atria outside of the sinoatrial node also release these signals. This results in two problems. First, the atria receive so many signals that they do not have time to fully contract. Second, the ventricles, which can only receive one signal at a time, beat irregularly and out of  rhythm with the atria.  There are three types of atrial fibrillation:   Paroxysmal. Paroxysmal atrial fibrillation starts suddenly and stops on its own within a week.  Persistent. Persistent atrial fibrillation lasts for more than a week. It may stop on its own or with treatment.  Permanent. Permanent atrial fibrillation does not go away. Episodes of atrial fibrillation may lead to permanent atrial fibrillation. Atrial fibrillation can prevent your heart from pumping blood normally. It increases your risk of stroke and can lead to heart failure.  CAUSES   Heart conditions, including a heart attack, heart failure, coronary artery disease, and heart valve conditions.   Inflammation of the sac that surrounds the heart (pericarditis).  Blockage of an artery in the lungs (pulmonary embolism).  Pneumonia or other infections.  Chronic lung disease.  Thyroid problems, especially if the thyroid is overactive (hyperthyroidism).  Caffeine, excessive alcohol use, and use of some illegal drugs.   Use of some medicines, including certain decongestants and diet pills.  Heart surgery.   Birth defects.  Sometimes, no cause can be found. When this happens, the atrial fibrillation is called lone atrial fibrillation. The risk of complications from atrial fibrillation increases  if you have lone atrial fibrillation and you are age 66 years or older. RISK FACTORS  Heart failure.  Coronary artery disease.  Diabetes mellitus.   High blood pressure (hypertension).   Obesity.   Other arrhythmias.   Increased age. SIGNS AND SYMPTOMS   A feeling that your heart is beating rapidly or irregularly.   A feeling of discomfort or pain in your chest.   Shortness of breath.   Sudden light-headedness or weakness.   Getting tired easily when exercising.   Urinating more often than normal (mainly when atrial fibrillation first begins).  In paroxysmal atrial fibrillation, symptoms may  start and suddenly stop. DIAGNOSIS  Your health care provider may be able to detect atrial fibrillation when taking your pulse. Your health care provider may have you take a test called an ambulatory electrocardiogram (ECG). An ECG records your heartbeat patterns over a 24-hour period. You may also have other tests, such as:  Transthoracic echocardiogram (TTE). During echocardiography, sound waves are used to evaluate how blood flows through your heart.  Transesophageal echocardiogram (TEE).  Stress test. There is more than one type of stress test. If a stress test is needed, ask your health care provider about which type is best for you.  Chest X-ray exam.  Blood tests.  Computed tomography (CT). TREATMENT  Treatment may include:  Treating any underlying conditions. For example, if you have an overactive thyroid, treating the condition may correct atrial fibrillation.  Taking medicine. Medicines may be given to control a rapid heart rate or to prevent blood clots, heart failure, or a stroke.  Having a procedure to correct the rhythm of the heart:  Electrical cardioversion. During electrical cardioversion, a controlled, low-energy shock is delivered to the heart through your skin. If you have chest pain, very low blood pressure, or sudden heart failure, this procedure may need to be done as an emergency.  Catheter ablation. During this procedure, heart tissues that send the signals that cause atrial fibrillation are destroyed.  Surgical ablation. During this surgery, thin lines of heart tissue that carry the abnormal signals are destroyed. This procedure can either be an open-heart surgery or a minimally invasive surgery. With the minimally invasive surgery, small cuts are made to access the heart instead of a large opening.  Pulmonary venous isolation. During this surgery, tissue around the veins that carry blood from the lungs (pulmonary veins) is destroyed. This tissue is thought to  carry the abnormal signals. HOME CARE INSTRUCTIONS   Take medicines only as directed by your health care provider. Some medicines can make atrial fibrillation worse or recur.  If blood thinners were prescribed by your health care provider, take them exactly as directed. Too much blood-thinning medicine can cause bleeding. If you take too little, you will not have the needed protection against stroke and other problems.  Perform blood tests at home if directed by your health care provider. Perform blood tests exactly as directed.  Quit smoking if you smoke.  Do not drink alcohol.  Do not drink caffeinated beverages such as coffee, soda, and some teas. You may drink decaffeinated coffee, soda, or tea.   Maintain a healthy weight.Do not use diet pills unless your health care provider approves. They may make heart problems worse.   Follow diet instructions as directed by your health care provider.  Exercise regularly as directed by your health care provider.  Keep all follow-up visits as directed by your health care provider. This is important. PREVENTION  The  following substances can cause atrial fibrillation to recur:   Caffeinated beverages.  Alcohol.  Certain medicines, especially those used for breathing problems.  Certain herbs and herbal medicines, such as those containing ephedra or ginseng.  Illegal drugs, such as cocaine and amphetamines. Sometimes medicines are given to prevent atrial fibrillation from recurring. Proper treatment of any underlying condition is also important in helping prevent recurrence.  SEEK MEDICAL CARE IF:  You notice a change in the rate, rhythm, or strength of your heartbeat.  You suddenly begin urinating more frequently.  You tire more easily when exerting yourself or exercising. SEEK IMMEDIATE MEDICAL CARE IF:   You have chest pain, abdominal pain, sweating, or weakness.  You feel nauseous.  You have shortness of breath.  You  suddenly have swollen feet and ankles.  You feel dizzy.  Your face or limbs feel numb or weak.  You have a change in your vision or speech. MAKE SURE YOU:   Understand these instructions.  Will watch your condition.  Will get help right away if you are not doing well or get worse. Document Released: 08/26/2005 Document Revised: 01/10/2014 Document Reviewed: 10/06/2012 [facility] Patient Information 2015 [place], [place]. This information is not intended to replace advice given to you by your health care provider. Make sure you discuss any questions you have with your health care provider.    Fall Prevention and Home Safety Falls cause injuries and can affect all age groups. It is possible to use preventive measures to significantly decrease the likelihood of falls. There are many simple measures which can make your home safer and prevent falls. OUTDOORS  Repair cracks and edges of walkways and driveways.  Remove high doorway thresholds.  Trim shrubbery on the main path into your home.  Have good outside lighting.  Clear walkways of tools, rocks, debris, and clutter.  Check that handrails are not broken and are securely fastened. Both sides of steps should have handrails.  Have leaves, snow, and ice cleared regularly.  Use sand or salt on walkways during winter months.  In the garage, clean up grease or oil spills. BATHROOM  Install night lights.  Install grab bars by the toilet and in the tub and shower.  Use non-skid mats or decals in the tub or shower.  Place a plastic non-slip stool in the shower to sit on, if needed.  Keep floors dry and clean up all water on the floor immediately.  Remove soap buildup in the tub or shower on a regular basis.  Secure bath mats with non-slip, double-sided rug tape.  Remove throw rugs and tripping hazards from the floors. BEDROOMS  Install night lights.  Make sure a bedside light is easy to reach.  Do not use oversized  bedding.  Keep a telephone by your bedside.  Have a firm chair with side arms to use for getting dressed.  Remove throw rugs and tripping hazards from the floor. KITCHEN  Keep handles on pots and pans turned toward the center of the stove. Use back burners when possible.  Clean up spills quickly and allow time for drying.  Avoid walking on wet floors.  Avoid hot utensils and knives.  Position shelves so they are not too high or low.  Place commonly used objects within easy reach.  If necessary, use a sturdy step stool with a grab bar when reaching.  Keep electrical cables out of the way.  Do not use floor polish or wax that makes floors slippery. If you  must use wax, use non-skid floor wax.  Remove throw rugs and tripping hazards from the floor. STAIRWAYS  Never leave objects on stairs.  Place handrails on both sides of stairways and use them. Fix any loose handrails. Make sure handrails on both sides of the stairways are as long as the stairs.  Check carpeting to make sure it is firmly attached along stairs. Make repairs to worn or loose carpet promptly.  Avoid placing throw rugs at the top or bottom of stairways, or properly secure the rug with carpet tape to prevent slippage. Get rid of throw rugs, if possible.  Have an electrician put in a light switch at the top and bottom of the stairs. OTHER FALL PREVENTION TIPS  Wear low-heel or rubber-soled shoes that are supportive and fit well. Wear closed toe shoes.  When using a stepladder, make sure it is fully opened and both spreaders are firmly locked. Do not climb a closed stepladder.  Add color or contrast paint or tape to grab bars and handrails in your home. Place contrasting color strips on first and last steps.  Learn and use mobility aids as needed. Install an electrical emergency response system.  Turn on lights to avoid dark areas. Replace light bulbs that burn out immediately. Get light switches that  glow.  Arrange furniture to create clear pathways. Keep furniture in the same place.  Firmly attach carpet with non-skid or double-sided tape.  Eliminate uneven floor surfaces.  Select a carpet pattern that does not visually hide the edge of steps.  Be aware of all pets. OTHER HOME SAFETY TIPS  Set the water temperature for 120 F (48.8 C).  Keep emergency numbers on or near the telephone.  Keep smoke detectors on every level of the home and near sleeping areas. Document Released: 08/16/2002 Document Revised: 02/25/2012 Document Reviewed: 11/15/2011 [facility] Patient Information 2015 [place], [place]. This information is not intended to replace advice given to you by your health care provider. Make sure you discuss any questions you have with your health care provider.    Insomnia Insomnia is frequent trouble falling and/or staying asleep. Insomnia can be a long term problem or a short term problem. Both are common. Insomnia can be a short term problem when the wakefulness is related to a certain stress or worry. Long term insomnia is often related to ongoing stress during waking hours and/or poor sleeping habits. Overtime, sleep deprivation itself can make the problem worse. Every little thing feels more severe because you are overtired and your ability to cope is decreased. CAUSES   Stress, anxiety, and depression.  Poor sleeping habits.  Distractions such as TV in the bedroom.  Naps close to bedtime.  Engaging in emotionally charged conversations before bed.  Technical reading before sleep.  Alcohol and other sedatives. They may make the problem worse. They can hurt normal sleep patterns and normal dream activity.  Stimulants such as caffeine for several hours prior to bedtime.  Pain syndromes and shortness of breath can cause insomnia.  Exercise late at night.  Changing time zones may cause sleeping problems (jet lag). It is sometimes helpful to have someone observe  your sleeping patterns. They should look for periods of not breathing during the night (sleep apnea). They should also look to see how long those periods last. If you live alone or observers are uncertain, you can also be observed at a sleep clinic where your sleep patterns will be professionally monitored. Sleep apnea requires a checkup and  treatment. Give your caregivers your medical history. Give your caregivers observations your family has made about your sleep.  SYMPTOMS   Not feeling rested in the morning.  Anxiety and restlessness at bedtime.  Difficulty falling and staying asleep. TREATMENT   Your caregiver may prescribe treatment for an underlying medical disorders. Your caregiver can give advice or help if you are using alcohol or other drugs for self-medication. Treatment of underlying problems will usually eliminate insomnia problems.  Medications can be prescribed for short time use. They are generally not recommended for lengthy use.  Over-the-counter sleep medicines are not recommended for lengthy use. They can be habit forming.  You can promote easier sleeping by making lifestyle changes such as:  Using relaxation techniques that help with breathing and reduce muscle tension.  Exercising earlier in the day.  Changing your diet and the time of your last meal. No night time snacks.  Establish a regular time to go to bed.  Counseling can help with stressful problems and worry.  Soothing music and white noise may be helpful if there are background noises you cannot remove.  Stop tedious detailed work at least one hour before bedtime. HOME CARE INSTRUCTIONS   Keep a diary. Inform your caregiver about your progress. This includes any medication side effects. See your caregiver regularly. Take note of:  Times when you are asleep.  Times when you are awake during the night.  The quality of your sleep.  How you feel the next day. This information will help your  caregiver care for you.  Get out of bed if you are still awake after 15 minutes. Read or do some quiet activity. Keep the lights down. Wait until you feel sleepy and go back to bed.  Keep regular sleeping and waking hours. Avoid naps.  Exercise regularly.  Avoid distractions at bedtime. Distractions include watching television or engaging in any intense or detailed activity like attempting to balance the household checkbook.  Develop a bedtime ritual. Keep a familiar routine of bathing, brushing your teeth, climbing into bed at the same time each night, listening to soothing music. Routines increase the success of falling to sleep faster.  Use relaxation techniques. This can be using breathing and muscle tension release routines. It can also include visualizing peaceful scenes. You can also help control troubling or intruding thoughts by keeping your mind occupied with boring or repetitive thoughts like the old concept of counting sheep. You can make it more creative like imagining planting one beautiful flower after another in your backyard garden.  During your day, work to eliminate stress. When this is not possible use some of the previous suggestions to help reduce the anxiety that accompanies stressful situations. MAKE SURE YOU:   Understand these instructions.  Will watch your condition.  Will get help right away if you are not doing well or get worse. Document Released: 08/23/2000 Document Revised: 11/18/2011 Document Reviewed: 09/23/2007 [facility] Patient Information 2015 [place], [place]. This information is not intended to replace advice given to you by your health care provider. Make sure you discuss any questions you have with your health care provider.

## 2015-03-27 ENCOUNTER — Telehealth: Payer: Self-pay | Admitting: Cardiovascular Disease

## 2015-03-27 ENCOUNTER — Telehealth: Payer: Self-pay | Admitting: Oncology

## 2015-03-27 LAB — URINE CULTURE

## 2015-03-27 NOTE — Telephone Encounter (Signed)
Pt med questions answered to his satisfaction.

## 2015-03-27 NOTE — Telephone Encounter (Signed)
Patient is confused about his medications.---does not know which ones he needs to take.

## 2015-03-27 NOTE — Telephone Encounter (Signed)
Raymond Chaney called and said he is not sure what metoprolol he should take.  He has regular metoprolol 50 mg and metoprolol succunate (XL) 50.  Advised him to call Dr. Gwenlyn Found to see which one he should take.  Also advised him to not take both.  Raymond Chaney verbalized that he would call Dr. Gwenlyn Found.

## 2015-03-28 ENCOUNTER — Telehealth (HOSPITAL_COMMUNITY): Payer: Self-pay

## 2015-03-28 NOTE — ED Notes (Signed)
Post ED Visit - Positive Culture Follow-up: Chart Hand-off to ED Flow Manager  Culture assessed and recommendations reviewed by: '[]'$  Levester Fresh, Pharm.D., BCPS '[x]'$  Heide Guile, Pharm.D., BCPS-AQ ID '[]'$  Alycia Rossetti, Pharm.D., BCPS '[]'$  [place], Pharm.D., BCPS, AAHIVP '[]'$  Legrand Como, Pharm .D., BCPS, AAHIVP '[]'$  Milus Glazier, Pharm.D.  Positive urine culture  '[]'$  Patient discharged without antimicrobial prescription and treatment is now indicated '[x]'$  Organism is resistant to prescribed ED discharge antimicrobial '[]'$  Patient with positive blood cultures  Changes discussed with ED provider: Ottie Glazier PA New antibiotic prescription  stop Bactrim. If urinary symptoms start cipro 500 mg po bid x 7 days.  Attempting to contact pt.    Ileene Musa 03/28/2015, 11:00 AM

## 2015-03-29 ENCOUNTER — Telehealth (HOSPITAL_COMMUNITY): Payer: Self-pay

## 2015-03-29 NOTE — ED Notes (Signed)
Spoke with pt. Informed of labs. Advised to stop Bactrim and start Cipro '500mg'$  po bid x 7 days per [facility] PA. This was called to [facility] on [address] [phone] left on physician voicemail.

## 2015-04-04 ENCOUNTER — Telehealth: Payer: Self-pay | Admitting: Cardiovascular Disease

## 2015-04-04 ENCOUNTER — Ambulatory Visit: Payer: Self-pay | Admitting: Pharmacist Clinician (PhC)/ Clinical Pharmacy Specialist

## 2015-04-04 DIAGNOSIS — I4891 Unspecified atrial fibrillation: Secondary | ICD-10-CM

## 2015-04-04 DIAGNOSIS — Z7901 Long term (current) use of anticoagulants: Secondary | ICD-10-CM

## 2015-04-04 NOTE — Telephone Encounter (Signed)
Raymond Chaney is calling to find out which medication he should take. Metoprolol or Tart tab '50mg'$  and the other supposed to be an extended one. Please call confused on which one he should take .   Thanks

## 2015-04-04 NOTE — Telephone Encounter (Signed)
Spoke with pt would like to know what Metoprolol he is to take . He has two different prescriptions; Metoprolol tartrate 50 mg and Metoprolol succinate 50 mg once daily. On 03/22/15 pt was seen in Dr. Kennon Holter clinic  the Metoprolol succinate was order. Pt was in the ED [facility] on 03/25/15. The Medication Metoprolol tartrate was  Prescribed. Pt states that he was not taken the Metoprolol tartrate which is the one he has always been taken, because he was getting cancer radiation treatment for 3 to 4 weeks. Pt needs clarification by Dr. Gwenlyn Found, because he has always have taken metoprolol tartrate 50 mg once daily.

## 2015-04-05 NOTE — Telephone Encounter (Signed)
Erasmo Downer, please advise

## 2015-04-05 NOTE — Telephone Encounter (Signed)
Looks as though patient was on metoprolol tartrate 50 mg once daily for several years.  I know this is best as a bid medication, but his cardiac disease is stable, so I would just leave him on that dose.   Discontinue the succinate.

## 2015-04-05 NOTE — Telephone Encounter (Signed)
I spoke with patient; he has already picked up the succinate.  I spoke with Erasmo Downer and she said that it was fine for him to switch to that.  I instructed patient not to alternate between the two medications, but to stick to the succinate.  He verbalized understanding.

## 2015-04-05 NOTE — Telephone Encounter (Signed)
Please refer to Cyril Mourning to sort out

## 2015-04-24 ENCOUNTER — Ambulatory Visit
Admission: RE | Admit: 2015-04-24 | Discharge: 2015-04-24 | Disposition: A | Discharge status: Home / Self Care | Payer: Medicare Other | Source: Ambulatory Visit | Attending: Radiation Oncology | Admitting: Radiation Oncology

## 2015-04-24 ENCOUNTER — Encounter: Payer: Self-pay | Admitting: Radiation Oncology

## 2015-04-24 VITALS — BP 115/63 | HR 111 | Temp 98.0°F | Resp 20 | Ht 72.0 in | Wt 256.3 lb

## 2015-04-24 DIAGNOSIS — C7931 Secondary malignant neoplasm of brain: Secondary | ICD-10-CM

## 2015-04-24 NOTE — Progress Notes (Signed)
Raymond Chaney Plan here for follow up.  He denies pain.  He reports increased shortness of breath especially with activity.  He uses 3 L of oxygen at night.  He reports having an occasional productive cough with white sputum.  He reports hospice will be seeing him tomorrow.  He is taking 1/2 of a 4 mg tablet of decadron daily.  He denies having any headaches.  He reports occasionally blurry vision.  He denies memory issues or confusion.  He reports his balance is off.  He is in a wheelchair today and said he uses a walker at home.  He reports falling at home 2 weeks ago. He reports having a seizure about 4 weeks ago when he was at [employer].  He reports having a bladder infection and has been on 2 different antibiotics.    BP 115/63 mmHg  Pulse 111  Temp(Src) 98 F (36.7 C) (Oral)  Resp 20  Ht 6' (1.829 m)  Wt 256 lb 4.8 oz (116.257 kg)  BMI 34.75 kg/m2  SpO2 95%

## 2015-04-24 NOTE — Progress Notes (Signed)
Radiation Oncology         ([phone] ________________________________  Name: Raymond Chaney MRN: [id number]  Date: 04/24/2015  DOB: 02-28-1932  Follow-Up Visit Note  CC: Tivis Ringer, MD  Gaye Pollack, MD  Diagnosis: Metastatic lung cancer with brain metastasis    ICD-9-CM ICD-10-CM   1. Brain metastasis 198.3 C79.31     Interval Since Last Radiation:  1 month  Radiation treatment dates:   03/22/2015 Site/dose:    The patient received stereotactic radiosurgery to the following target:  PTV1 left occipital 12m target was treated using 3 Arcs to a prescription dose of 20 Gy. ExacTrac Snap verification was performed for each couch angle.   Radiation treatment dates: 01/31/2015-02/14/2015 Site/dose:   Left lower lobe nodule, 50 gray in 10 fractions  Narrative:  The patient returns today for routine follow-up. He denies pain. He reports increased shortness of breath, especially with activity and that his breathing has gotten worse since his last chest CT in May. He uses 3 L of oxygen at night. He reports having an occasional productive cough with white sputum. He reports hospice will be seeing him tomorrow. He is taking 1/2 of a 4 mg tablet of decadron daily. He denies having any headaches. He reports occasional blurry vision. He denies memory issues or confusion. He reports his balance is off. He is in a wheelchair today and said he uses a walker at home. He reports falling multiple times throughout the last couple of weeks. He reports having a seizure about 4 weeks ago when he was [place] [employer] He reports that he did not lose consciousness and is not on any medication for it. He reports having a bladder infection and has been on 2 different antibiotics.  ALLERGIES:  is allergic to penicillins.  Meds: Current Outpatient Prescriptions  Medication Sig Dispense Refill  . allopurinol (ZYLOPRIM) 100 MG tablet Take 100 mg by mouth daily.    . Ascorbic Acid (VITAMIN C) 1000 MG  tablet Take 1,000 mg by mouth daily.    . ciprofloxacin (CIPRO) 500 MG tablet take 1 tablet by mouth twice a day for 7 days  0  . dexamethasone (DECADRON) 4 MG tablet Take 1 tablet (4 mg total) by mouth every 6 (six) hours. (Patient taking differently: Take 2-4 mg by mouth See admin instructions. Starting 03/25/15: take 1 tablet (4 mg) three times daily for 1 week, then take 1 tablet (4 mg) two times daily for 1 week, then take 1 tablet (4 mg) daily for 1 week, then take 1/2 tablet (2 mg) daily for 1 week, then stop) 50 tablet 0  . doxazosin (CARDURA) 4 MG tablet Take 4 mg by mouth daily.    . fluticasone (FLONASE) 50 MCG/ACT nasal spray Place 2 sprays into the nose daily as needed for allergies.   0  . lisinopril (PRINIVIL,ZESTRIL) 10 MG tablet Take 10 mg by mouth daily.  2  . metFORMIN (GLUCOPHAGE) 1000 MG tablet Take 1,000 mg by mouth daily with breakfast.    . metoprolol (LOPRESSOR) 50 MG tablet Take 50 mg by mouth daily.  2  . pantoprazole (PROTONIX) 40 MG tablet Take 1 tablet (40 mg total) by mouth at bedtime. 30 tablet 0  . pravastatin (PRAVACHOL) 80 MG tablet Take 80 mg by mouth daily.    .Marland Kitchenacetaminophen (TYLENOL) 325 MG tablet Take 2 tablets (650 mg total) by mouth every 4 (four) hours as needed for mild pain (or temp > 99 F). (Patient not  taking: Reported on 03/16/2015)    . doxycycline (VIBRAMYCIN) 100 MG capsule Take 100 mg by mouth 2 (two) times daily. 10 day supply filled 03/21/15  0  . metoprolol succinate (TOPROL-XL) 50 MG 24 hr tablet Take 1 tablet (50 mg total) by mouth daily. (Patient not taking: Reported on 03/25/2015) 90 tablet 3  . mirabegron ER (MYRBETRIQ) 50 MG TB24 tablet Take 50 mg by mouth daily.    Marland Kitchen zolpidem (AMBIEN) 5 MG tablet Take 1 tablet (5 mg total) by mouth at bedtime as needed for sleep. (Patient not taking: Reported on 04/24/2015) 10 tablet 0   No current facility-administered medications for this encounter.    Physical Findings: The patient is in no acute  distress. Patient is alert and oriented.  height is 6' (1.829 m) and weight is 256 lb 4.8 oz (116.257 kg). His oral temperature is 98 F (36.7 C). His blood pressure is 115/63 and his pulse is 111. His respiration is 20 and oxygen saturation is 95%.  Pt presents to the clinic in a wheelchair, but uses a walker at home. No thrush in the oral cavity. Multiple ecchymosis present on the bilateral arms, legs, and face.  Lab Findings: Lab Results  Component Value Date   WBC 11.5* 03/25/2015   HGB 13.0 03/25/2015   HCT 40.2 03/25/2015   PLT 90* 03/25/2015    Lab Results  Component Value Date   NA 140 03/25/2015   K 4.6 03/25/2015   CO2 27 03/25/2015   GLUCOSE 151* 03/25/2015   BUN 24* 03/25/2015   BUN 25.8 03/20/2015   CREATININE 1.02 03/25/2015   CREATININE 0.9 03/20/2015   CREATININE 0.82 09/28/2014   BILITOT 0.6 03/25/2015   ALKPHOS 51 03/25/2015   AST 25 03/25/2015   ALT 31 03/25/2015   PROT 4.7* 03/25/2015   ALBUMIN 2.6* 03/25/2015   CALCIUM 8.4* 03/25/2015   ANIONGAP 10 03/25/2015    Radiographic Findings: No results found.  Impression:  The patient is recovering from the effects of radiation. He is complaining of some increased shortness of breath. His last CT scans were several months ago.  Plan: Due to the pt reporting increased difficulty in breathing, I will order a CT of the chest to be scheduled within a week and he will return to clinic to review the results of this.. Schedule an MRI of the brain in 2 months. We will review the results of the MRI at brain clinic and have the pt follow up afterwards.  This document serves as a record of services personally performed by Kyung Rudd, MD. It was created on his behalf by Darcus Austin, a trained medical scribe. The creation of this record is based on the scribe's personal observations and the provider's statements to them. This document has been checked and approved by the attending provider.      _____________________________________   Jodelle Gross, MD, PhD

## 2015-04-25 ENCOUNTER — Encounter: Payer: Self-pay | Admitting: Radiation Oncology

## 2015-04-28 ENCOUNTER — Ambulatory Visit (HOSPITAL_COMMUNITY)
Admission: RE | Admit: 2015-04-28 | Discharge: 2015-04-28 | Disposition: A | Discharge status: Home / Self Care | Payer: Medicare Other | Source: Ambulatory Visit | Attending: Radiation Oncology | Admitting: Radiation Oncology

## 2015-04-28 ENCOUNTER — Encounter (HOSPITAL_COMMUNITY): Payer: Self-pay

## 2015-04-28 DIAGNOSIS — Z923 Personal history of irradiation: Secondary | ICD-10-CM | POA: Insufficient documentation

## 2015-04-28 DIAGNOSIS — J432 Centrilobular emphysema: Secondary | ICD-10-CM | POA: Insufficient documentation

## 2015-04-28 DIAGNOSIS — C3490 Malignant neoplasm of unspecified part of unspecified bronchus or lung: Secondary | ICD-10-CM | POA: Diagnosis present

## 2015-04-28 DIAGNOSIS — M899 Disorder of bone, unspecified: Secondary | ICD-10-CM | POA: Insufficient documentation

## 2015-04-28 DIAGNOSIS — I2510 Atherosclerotic heart disease of native coronary artery without angina pectoris: Secondary | ICD-10-CM | POA: Diagnosis not present

## 2015-04-28 DIAGNOSIS — I509 Heart failure, unspecified: Secondary | ICD-10-CM | POA: Diagnosis not present

## 2015-04-28 DIAGNOSIS — C7931 Secondary malignant neoplasm of brain: Secondary | ICD-10-CM | POA: Diagnosis not present

## 2015-04-28 DIAGNOSIS — R0602 Shortness of breath: Secondary | ICD-10-CM | POA: Diagnosis not present

## 2015-04-28 DIAGNOSIS — Z8673 Personal history of transient ischemic attack (TIA), and cerebral infarction without residual deficits: Secondary | ICD-10-CM | POA: Diagnosis not present

## 2015-04-28 MED ORDER — IOHEXOL 300 MG/ML  SOLN
75.0000 mL | Freq: Once | INTRAMUSCULAR | Status: AC | PRN
  Administered 2015-04-28: 75 mL via INTRAVENOUS

## 2015-05-04 ENCOUNTER — Encounter: Payer: Self-pay | Admitting: Radiation Oncology

## 2015-05-04 ENCOUNTER — Telehealth: Payer: Self-pay | Admitting: Oncology

## 2015-05-04 ENCOUNTER — Telehealth: Payer: Self-pay | Admitting: *Deleted

## 2015-05-04 ENCOUNTER — Ambulatory Visit
Admission: RE | Admit: 2015-05-04 | Discharge: 2015-05-04 | Disposition: A | Discharge status: Home / Self Care | Payer: Medicare Other | Source: Ambulatory Visit | Attending: Radiation Oncology | Admitting: Radiation Oncology

## 2015-05-04 VITALS — BP 120/72 | HR 92 | Temp 97.6°F | Resp 20 | Ht 72.0 in | Wt 250.4 lb

## 2015-05-04 DIAGNOSIS — C7931 Secondary malignant neoplasm of brain: Secondary | ICD-10-CM

## 2015-05-04 NOTE — Telephone Encounter (Signed)
Returned call to [name] of [facility] if Dr. Lisbeth Renshaw and patient gives consent I can fax her request for the results of patient's Ct scan , to [phone],,[name] stated she appreciated the call back 10:03 AM

## 2015-05-04 NOTE — Progress Notes (Signed)
follow up   [facility] 03/22/15, had CT chest 04/28/15, here for results, no headaches,nasue,dizyness stated, no vision changes, no pain,  Asked and got permission to send Ct chest results to Hospice, and okay per MD,faxedto their office [phone], also he takes lasix only daily instead of bid as directed, swelling has gone down in feet Appetite good, placed yellow fall risk bracelet on left wrist, patient slow gaitt with walker 4:52 PM BP 120/72 mmHg  Pulse 92  Temp(Src) 97.6 F (36.4 C) (Oral)  Resp 20  Ht 6' (1.829 m)  Wt 250 lb 6.4 oz (113.581 kg)  BMI 33.95 kg/m2  SpO2 97% Wt Readings from Last 3 Encounters:  05/04/15 250 lb 6.4 oz (113.581 kg)  04/24/15 256 lb 4.8 oz (116.257 kg)  03/16/15 260 lb 1.6 oz (117.981 kg)

## 2015-05-04 NOTE — Telephone Encounter (Addendum)
[name] from [facility] and [name] left a message requesting a copy of Raymond Chaney recent CT scan faxed to them at [phone] after his appointment today with Dr. Lisbeth Renshaw.  [name]'s phone number is [phone].

## 2015-05-05 ENCOUNTER — Encounter: Payer: Self-pay | Admitting: Radiation Oncology

## 2015-05-05 NOTE — Addendum Note (Signed)
Encounter addended by: Kyung Rudd, MD on: 05/05/2015  7:52 AM<BR>     Documentation filed: Clinical Notes

## 2015-05-05 NOTE — Progress Notes (Addendum)
Radiation Oncology         [phone] ________________________________  Name: Raymond Chaney MRN: [id number]  Date: 05/04/2015  DOB: 09/12/1931  Follow-Up Visit Note  CC: Tivis Ringer, MD  Gaye Pollack, MD  Diagnosis:   Metastatic lung cancer with range metastasis  Narrative:  The patient returns today for follow-up for a recent CT scan of the chest. He completed radiosurgery to a solitary brain metastasis on 03/22/2015. At his one-month follow-up, the patient was complaining of significant shortness of breath. It had been some time since his last CT scan of the chest and therefore we decided to proceed with a imaging study this week to make sure progression was not contributing to his description of some worsening breathing. The patient notes no significant changes in his clinical status over the last week.                   ALLERGIES:  is allergic to penicillins.  Meds: Current Outpatient Prescriptions  Medication Sig Dispense Refill  . allopurinol (ZYLOPRIM) 100 MG tablet Take 100 mg by mouth daily.    . Ascorbic Acid (VITAMIN C) 1000 MG tablet Take 1,000 mg by mouth daily.    Marland Kitchen dexamethasone (DECADRON) 4 MG tablet Take 1 tablet (4 mg total) by mouth every 6 (six) hours. (Patient taking differently: Take 2-4 mg by mouth See admin instructions. Starting 03/25/15: take 1 tablet (4 mg) three times daily for 1 week, then take 1 tablet (4 mg) two times daily for 1 week, then take 1 tablet (4 mg) daily for 1 week, then take 1/2 tablet (2 mg) daily for 1 week, then stop) 50 tablet 0  . fluticasone (FLONASE) 50 MCG/ACT nasal spray Place 2 sprays into the nose daily as needed for allergies.   0  . furosemide (LASIX) 40 MG tablet Take 40 mg by mouth 2 (two) times daily.    Marland Kitchen lisinopril (PRINIVIL,ZESTRIL) 10 MG tablet Take 10 mg by mouth daily.  2  . metFORMIN (GLUCOPHAGE) 1000 MG tablet Take 1,000 mg by mouth daily with breakfast.    . metoprolol (LOPRESSOR) 50 MG tablet Take 50 mg  by mouth daily.  2  . metoprolol succinate (TOPROL-XL) 50 MG 24 hr tablet Take 1 tablet (50 mg total) by mouth daily. 90 tablet 3  . mirabegron ER (MYRBETRIQ) 50 MG TB24 tablet Take 50 mg by mouth daily.    . pantoprazole (PROTONIX) 40 MG tablet Take 1 tablet (40 mg total) by mouth at bedtime. 30 tablet 0  . pravastatin (PRAVACHOL) 80 MG tablet Take 80 mg by mouth daily.    Marland Kitchen acetaminophen (TYLENOL) 325 MG tablet Take 2 tablets (650 mg total) by mouth every 4 (four) hours as needed for mild pain (or temp > 99 F). (Patient not taking: Reported on 03/16/2015)    . ciprofloxacin (CIPRO) 500 MG tablet take 1 tablet by mouth twice a day for 7 days  0  . doxazosin (CARDURA) 4 MG tablet Take 4 mg by mouth daily.    Marland Kitchen doxycycline (VIBRAMYCIN) 100 MG capsule Take 100 mg by mouth 2 (two) times daily. 10 day supply filled 03/21/15  0  . zolpidem (AMBIEN) 5 MG tablet Take 1 tablet (5 mg total) by mouth at bedtime as needed for sleep. (Patient not taking: Reported on 04/24/2015) 10 tablet 0   No current facility-administered medications for this encounter.    Physical Findings: The patient is in no acute distress. Patient is alert  and oriented.  height is 6' (1.829 m) and weight is 250 lb 6.4 oz (113.581 kg). His oral temperature is 97.6 F (36.4 C). His blood pressure is 120/72 and his pulse is 92. His respiration is 20 and oxygen saturation is 97%. .     Lab Findings: Lab Results  Component Value Date   WBC 11.5* 03/25/2015   HGB 13.0 03/25/2015   HCT 40.2 03/25/2015   MCV 96.4 03/25/2015   PLT 90* 03/25/2015     Radiographic Findings: Ct Chest W Contrast  04/28/2015   CLINICAL DATA:  Lung cancer with brain metastasis diagnosed 2/16. Increase shortness of breath for 3 weeks. Radiation therapy complete in February. CHF. COPD. Prior stroke.  EXAM: CT CHEST WITH CONTRAST  TECHNIQUE: Multidetector CT imaging of the chest was performed during intravenous contrast administration.  CONTRAST:  82m  OMNIPAQUE IOHEXOL 300 MG/ML  SOLN  COMPARISON:  03/25/2015.  Chest CT 01/11/2015  FINDINGS: Mediastinum/Nodes: Tiny right thyroid nodule is nonspecific. No supraclavicular adenopathy. Aortic and branch vessel atherosclerosis. Borderline cardiomegaly with lipomatous hypertrophy of the interatrial septum. Multivessel coronary artery atherosclerosis. No central pulmonary embolism, on this non-dedicated study. An upper normal size 9 mm AP window node is not significantly changed. No hilar adenopathy.  Lungs/Pleura: No pleural fluid. Mild centrilobular emphysema. Probable secretions along the right-sided trachea including on image 13.  Cavitary subpleural right upper lobe pulmonary nodule measures 1.6 x 1.3 cm on image 25. On the prior exam, this measured 1.6 by 1.1 cm. Similar morphology.  There may be a developing nodule just anterior and superior to this, on the order of 6 mm on image 23 of series 6. Bibasilar scarring.  A left upper lobe 7 mm minimally cavitary lung nodule on image 22 is new.  central left lower lobe 6 mm nodule on image 34 is either new or enlarged since the prior exam (at a vascular branch point).  The left lower lobe lung nodules x2 which were surrounded by hemorrhage on the prior exam have resolved. No new consolidation.  Upper abdomen: Normal imaged portions of the liver, spleen, stomach, pancreas, gallbladder, biliary tract, kidneys. Similar mild adrenal thickening. Surgical changes of the left upper quadrant.  Musculoskeletal: Thoracic spondylosis. A left-sided T1 vertebral body lucent lesion has been present back to 08/25/2014 and not significantly hypermetabolic on prior PET.  IMPRESSION: 1. Mixed response to therapy of pulmonary nodules. Left lower lobe known nodules have resolved. However, there is a new left upper lobe nodule and a new or enlarged central left lower lobe nodule. The right upper lobe cavitary lesion is unchanged. 2. No thoracic adenopathy ; upper normal sized AP window  node is not significantly changed. 3.  Atherosclerosis, including within the coronary arteries. 4. Chronic lucent lesion within the left side of the T1 vertebral body is favored to be benign.   Electronically Signed   By: KAbigail MiyamotoM.D.   On: 04/28/2015 14:53    Impression:    The patient clinically is stable. The CT scan of the chest showed what was described as a mixed response to therapy of his pulmonary nodules. Has a new 7 mm left upper lobe lung nodule as well as a new 6 mm nodule within the central left lower lobe. No progression seen of the previously treated areas. I discussed with the patient the pros and cons of going ahead and treating these areas right now versus continued observation. At the end of this discussion, the patient was interested in  seeing Dr. Sondra Come to discuss possible additional stereotactic body radiation treatment. We will set this up, potentially sometime next week. Notably, the patient does note that he has become enrolled in hospice over the last couple of weeks. Coordination with them with any potential treatment going forward can be addressed depending on the patient's wishes.  Plan:  We also discussed continued observation through our brain tumor program. He will receive a brain MRI scan in 2 months.   Jodelle Gross, M.D., Ph.D.

## 2015-05-10 ENCOUNTER — Telehealth: Payer: Self-pay | Admitting: *Deleted

## 2015-05-10 NOTE — Telephone Encounter (Signed)
Called patient to inform of fu appt. On 05-16-15- arrival time - 3:50 pm, spoke with patient and he is aware of this appt.

## 2015-05-10 NOTE — Telephone Encounter (Signed)
On 05-10-15 fax medical records to [facility] it was note from 04-24-15

## 2015-05-16 ENCOUNTER — Encounter: Payer: Self-pay | Admitting: Radiation Oncology

## 2015-05-16 ENCOUNTER — Ambulatory Visit
Admission: RE | Admit: 2015-05-16 | Discharge: 2015-05-16 | Disposition: A | Discharge status: Home / Self Care | Payer: Medicare Other | Source: Ambulatory Visit | Attending: Radiation Oncology | Admitting: Radiation Oncology

## 2015-05-16 VITALS — BP 91/49 | HR 137 | Temp 97.8°F | Resp 16 | Ht 72.0 in

## 2015-05-16 DIAGNOSIS — C3411 Malignant neoplasm of upper lobe, right bronchus or lung: Secondary | ICD-10-CM

## 2015-05-16 NOTE — Progress Notes (Signed)
Radiation Oncology         [phone] ________________________________  Name: Raymond Chaney MRN: [id number]  Date: 05/16/2015  DOB: Jul 08, 1932  Follow-Up Visit Note  CC: Tivis Ringer, MD  Gaye Pollack, MD    ICD-9-CM ICD-10-CM   1. Cancer of upper lobe of right lung 162.3 C34.11     Diagnosis:   Recurrent lung cancer    Narrative:  The patient returns today for further evaluation and consideration for additional radiation therapy directed at the chest area. Patient is received 2 separate courses of radiation to the chest. His initial treatment was directed at the right lung area and a second at the left lower lung area after he developed hemoptysis and new nodules in this area. Patient also has been diagnosed with brain metastasis and underwent SRS treatment by Dr. Lisbeth Renshaw  with recent MRI showing  the left occipital lesion to be stable with no new problems. Patient was seen recently by Dr. Lisbeth Renshaw was complaining of shortness of breath. A chest CT scan was performed which is reviewed below,  shows 2 small nodules in the left lung. His previously treated left lower lung areas have completely resolved. He has some scar tissue and a stable lesion in the right lung field. No evidence of mediastinal or hilar adenopathy.  Patient complains of some hoarseness. He complains of shortness of breath but is on no oxygen today in the clinic. He uses 2 L of oxygen at night in for occasional use around the house. Patient denies any further hemoptysis or pain within the chest area.  No headaches                       ALLERGIES:  is allergic to penicillins.  Meds: Current Outpatient Prescriptions  Medication Sig Dispense Refill  . allopurinol (ZYLOPRIM) 100 MG tablet Take 100 mg by mouth daily.    . Ascorbic Acid (VITAMIN C) 1000 MG tablet Take 1,000 mg by mouth daily.    Marland Kitchen dexamethasone (DECADRON) 4 MG tablet Take 1 tablet (4 mg total) by mouth every 6 (six) hours. (Patient taking  differently: Take 2-4 mg by mouth See admin instructions. Starting 03/25/15: take 1 tablet (4 mg) three times daily for 1 week, then take 1 tablet (4 mg) two times daily for 1 week, then take 1 tablet (4 mg) daily for 1 week, then take 1/2 tablet (2 mg) daily for 1 week, then stop) 50 tablet 0  . doxazosin (CARDURA) 4 MG tablet Take 4 mg by mouth daily.    Marland Kitchen doxycycline (VIBRAMYCIN) 100 MG capsule Take 100 mg by mouth 2 (two) times daily. 10 day supply filled 03/21/15  0  . furosemide (LASIX) 40 MG tablet Take 40 mg by mouth 2 (two) times daily.    Marland Kitchen lisinopril (PRINIVIL,ZESTRIL) 10 MG tablet Take 10 mg by mouth daily.  2  . metFORMIN (GLUCOPHAGE) 1000 MG tablet Take 1,000 mg by mouth daily with breakfast.    . metoprolol succinate (TOPROL-XL) 50 MG 24 hr tablet Take 1 tablet (50 mg total) by mouth daily. 90 tablet 3  . mirabegron ER (MYRBETRIQ) 50 MG TB24 tablet Take 50 mg by mouth daily.    . pravastatin (PRAVACHOL) 80 MG tablet Take 80 mg by mouth daily.    Marland Kitchen acetaminophen (TYLENOL) 325 MG tablet Take 2 tablets (650 mg total) by mouth every 4 (four) hours as needed for mild pain (or temp > 99 F). (Patient not taking:  Reported on 03/16/2015)    . ciprofloxacin (CIPRO) 500 MG tablet take 1 tablet by mouth twice a day for 7 days  0  . fluticasone (FLONASE) 50 MCG/ACT nasal spray Place 2 sprays into the nose daily as needed for allergies.   0  . metoprolol (LOPRESSOR) 50 MG tablet Take 50 mg by mouth daily.  2  . pantoprazole (PROTONIX) 40 MG tablet Take 1 tablet (40 mg total) by mouth at bedtime. (Patient not taking: Reported on 05/16/2015) 30 tablet 0  . zolpidem (AMBIEN) 5 MG tablet Take 1 tablet (5 mg total) by mouth at bedtime as needed for sleep. (Patient not taking: Reported on 04/24/2015) 10 tablet 0   No current facility-administered medications for this encounter.    Physical Findings: The patient is in no acute distress. Patient is alert and oriented.  height is 6' (1.829 m). His oral  temperature is 97.8 F (36.6 C). His blood pressure is 91/49 and his pulse is 137. His respiration is 16 and oxygen saturation is 98%. .  The lungs are clear. The heart has an irregular rhythm consistent with atrial fibrillation.   Lab Findings: Lab Results  Component Value Date   WBC 11.5* 03/25/2015   HGB 13.0 03/25/2015   HCT 40.2 03/25/2015   MCV 96.4 03/25/2015   PLT 90* 03/25/2015    Radiographic Findings: Ct Chest W Contrast  04/28/2015   CLINICAL DATA:  Lung cancer with brain metastasis diagnosed 2/16. Increase shortness of breath for 3 weeks. Radiation therapy complete in February. CHF. COPD. Prior stroke.  EXAM: CT CHEST WITH CONTRAST  TECHNIQUE: Multidetector CT imaging of the chest was performed during intravenous contrast administration.  CONTRAST:  77m OMNIPAQUE IOHEXOL 300 MG/ML  SOLN  COMPARISON:  03/25/2015.  Chest CT 01/11/2015  FINDINGS: Mediastinum/Nodes: Tiny right thyroid nodule is nonspecific. No supraclavicular adenopathy. Aortic and branch vessel atherosclerosis. Borderline cardiomegaly with lipomatous hypertrophy of the interatrial septum. Multivessel coronary artery atherosclerosis. No central pulmonary embolism, on this non-dedicated study. An upper normal size 9 mm AP window node is not significantly changed. No hilar adenopathy.  Lungs/Pleura: No pleural fluid. Mild centrilobular emphysema. Probable secretions along the right-sided trachea including on image 13.  Cavitary subpleural right upper lobe pulmonary nodule measures 1.6 x 1.3 cm on image 25. On the prior exam, this measured 1.6 by 1.1 cm. Similar morphology.  There may be a developing nodule just anterior and superior to this, on the order of 6 mm on image 23 of series 6. Bibasilar scarring.  A left upper lobe 7 mm minimally cavitary lung nodule on image 22 is new.  central left lower lobe 6 mm nodule on image 34 is either new or enlarged since the prior exam (at a vascular branch point).  The left lower lobe  lung nodules x2 which were surrounded by hemorrhage on the prior exam have resolved. No new consolidation.  Upper abdomen: Normal imaged portions of the liver, spleen, stomach, pancreas, gallbladder, biliary tract, kidneys. Similar mild adrenal thickening. Surgical changes of the left upper quadrant.  Musculoskeletal: Thoracic spondylosis. A left-sided T1 vertebral body lucent lesion has been present back to 08/25/2014 and not significantly hypermetabolic on prior PET.  IMPRESSION: 1. Mixed response to therapy of pulmonary nodules. Left lower lobe known nodules have resolved. However, there is a new left upper lobe nodule and a new or enlarged central left lower lobe nodule. The right upper lobe cavitary lesion is unchanged. 2. No thoracic adenopathy ; upper normal sized  AP window node is not significantly changed. 3.  Atherosclerosis, including within the coronary arteries. 4. Chronic lucent lesion within the left side of the T1 vertebral body is favored to be benign.   Electronically Signed   By: Abigail Miyamoto M.D.   On: 04/28/2015 14:53    Impression:  Oligo metastasis involving the left lung area. These new lesions are separate from his prior lung cancers. I discussed with the patient that these lesions are small and likely would not be contributing to his shortness of breath.  His shortness of breath  may be multifactorial with his history of atrial fibrillation. I discussed with the patient that these lesions will likely a large and it later date potentially could cause breathing problems. Options to consider would be to follow these areas to repeat a chest CT scan in 3 months or to proceed with SBRT for these 2 lesions. Patient would like to have these areas treated. Patient may have recently enrolled in hospice and would have to check with this issue as to whether he can proceed with SBRT  Plan:  Treatment pending determination of hospice evaluation and  enrollment.  ____________________________________ Gery Pray, MD

## 2015-05-16 NOTE — Progress Notes (Addendum)
Raymond Chaney here for follow up.  He reports having occasional headaches especially when straining.  He is taking 4 mg of decadron daily.  He reports having issues with balance.  He is in a wheelchair today.  He reports having shortness of breath.  He uses 2.5 liters of oxygen at home.  He reports an occasional cough.  He denies hemoptysis.  He reports his voice is hoarse and he feels like sputum is stuck in his throat. He currently has hospice.  He reports his weight was 235 lb this morning at home.  He reports having a poor appetite.  His bp was low at 89/54 and 91/49 and hr was elevated at 109 and 137.  He is taking bp medication.  BP 91/49 mmHg  Pulse 137  Temp(Src) 97.8 F (36.6 C) (Oral)  Resp 16  Ht 6' (1.829 m)  Wt   SpO2 98%   Wt Readings from Last 3 Encounters:  05/04/15 250 lb 6.4 oz (113.581 kg)  04/24/15 256 lb 4.8 oz (116.257 kg)  03/16/15 260 lb 1.6 oz (117.981 kg)

## 2015-05-22 ENCOUNTER — Telehealth: Payer: Self-pay | Admitting: Oncology

## 2015-05-22 NOTE — Telephone Encounter (Signed)
Raymond Chaney from Hospice called and said Dr. Lyman Speller with approve the 3 radiation treatments to Raymond Chaney chest because of his prior history of his lung tumors bleeding.  Advised her that Dr. Sondra Come will be notified and that we will contact Raymond Chaney with a schedule.

## 2015-05-24 ENCOUNTER — Emergency Department (HOSPITAL_COMMUNITY)

## 2015-05-24 ENCOUNTER — Observation Stay (HOSPITAL_COMMUNITY)
Admission: EM | Admit: 2015-05-24 | Discharge: 2015-05-26 | Disposition: A | Discharge status: Home / Self Care | Attending: Internal Medicine | Admitting: Internal Medicine

## 2015-05-24 ENCOUNTER — Encounter (HOSPITAL_COMMUNITY): Payer: Self-pay | Admitting: *Deleted

## 2015-05-24 DIAGNOSIS — N179 Acute kidney failure, unspecified: Principal | ICD-10-CM | POA: Diagnosis present

## 2015-05-24 DIAGNOSIS — E1165 Type 2 diabetes mellitus with hyperglycemia: Secondary | ICD-10-CM | POA: Diagnosis not present

## 2015-05-24 DIAGNOSIS — R1031 Right lower quadrant pain: Secondary | ICD-10-CM | POA: Insufficient documentation

## 2015-05-24 DIAGNOSIS — K5730 Diverticulosis of large intestine without perforation or abscess without bleeding: Secondary | ICD-10-CM | POA: Insufficient documentation

## 2015-05-24 DIAGNOSIS — I2510 Atherosclerotic heart disease of native coronary artery without angina pectoris: Secondary | ICD-10-CM | POA: Diagnosis not present

## 2015-05-24 DIAGNOSIS — E785 Hyperlipidemia, unspecified: Secondary | ICD-10-CM | POA: Insufficient documentation

## 2015-05-24 DIAGNOSIS — F1010 Alcohol abuse, uncomplicated: Secondary | ICD-10-CM | POA: Insufficient documentation

## 2015-05-24 DIAGNOSIS — I509 Heart failure, unspecified: Secondary | ICD-10-CM | POA: Diagnosis not present

## 2015-05-24 DIAGNOSIS — R627 Adult failure to thrive: Secondary | ICD-10-CM | POA: Diagnosis not present

## 2015-05-24 DIAGNOSIS — Z9221 Personal history of antineoplastic chemotherapy: Secondary | ICD-10-CM | POA: Insufficient documentation

## 2015-05-24 DIAGNOSIS — R531 Weakness: Secondary | ICD-10-CM

## 2015-05-24 DIAGNOSIS — C7931 Secondary malignant neoplasm of brain: Secondary | ICD-10-CM | POA: Insufficient documentation

## 2015-05-24 DIAGNOSIS — Z6833 Body mass index (BMI) 33.0-33.9, adult: Secondary | ICD-10-CM | POA: Diagnosis not present

## 2015-05-24 DIAGNOSIS — Z87891 Personal history of nicotine dependence: Secondary | ICD-10-CM | POA: Diagnosis not present

## 2015-05-24 DIAGNOSIS — E669 Obesity, unspecified: Secondary | ICD-10-CM

## 2015-05-24 DIAGNOSIS — M1990 Unspecified osteoarthritis, unspecified site: Secondary | ICD-10-CM | POA: Insufficient documentation

## 2015-05-24 DIAGNOSIS — R911 Solitary pulmonary nodule: Secondary | ICD-10-CM | POA: Diagnosis not present

## 2015-05-24 DIAGNOSIS — M109 Gout, unspecified: Secondary | ICD-10-CM | POA: Diagnosis not present

## 2015-05-24 DIAGNOSIS — Z8673 Personal history of transient ischemic attack (TIA), and cerebral infarction without residual deficits: Secondary | ICD-10-CM | POA: Diagnosis not present

## 2015-05-24 DIAGNOSIS — I4891 Unspecified atrial fibrillation: Secondary | ICD-10-CM | POA: Diagnosis not present

## 2015-05-24 DIAGNOSIS — I10 Essential (primary) hypertension: Secondary | ICD-10-CM | POA: Diagnosis not present

## 2015-05-24 DIAGNOSIS — R112 Nausea with vomiting, unspecified: Secondary | ICD-10-CM

## 2015-05-24 DIAGNOSIS — E1169 Type 2 diabetes mellitus with other specified complication: Secondary | ICD-10-CM

## 2015-05-24 DIAGNOSIS — G4730 Sleep apnea, unspecified: Secondary | ICD-10-CM | POA: Insufficient documentation

## 2015-05-24 DIAGNOSIS — M47894 Other spondylosis, thoracic region: Secondary | ICD-10-CM | POA: Diagnosis not present

## 2015-05-24 DIAGNOSIS — J449 Chronic obstructive pulmonary disease, unspecified: Secondary | ICD-10-CM | POA: Insufficient documentation

## 2015-05-24 DIAGNOSIS — Z880 Allergy status to penicillin: Secondary | ICD-10-CM | POA: Insufficient documentation

## 2015-05-24 DIAGNOSIS — C3411 Malignant neoplasm of upper lobe, right bronchus or lung: Secondary | ICD-10-CM | POA: Diagnosis present

## 2015-05-24 DIAGNOSIS — E860 Dehydration: Secondary | ICD-10-CM | POA: Diagnosis present

## 2015-05-24 DIAGNOSIS — R1032 Left lower quadrant pain: Secondary | ICD-10-CM | POA: Insufficient documentation

## 2015-05-24 DIAGNOSIS — I77811 Abdominal aortic ectasia: Secondary | ICD-10-CM | POA: Insufficient documentation

## 2015-05-24 HISTORY — DX: Acute embolism and thrombosis of unspecified deep veins of unspecified lower extremity: I82.409

## 2015-05-24 HISTORY — DX: Type 2 diabetes mellitus without complications: E11.9

## 2015-05-24 HISTORY — DX: Cardiac murmur, unspecified: R01.1

## 2015-05-24 HISTORY — DX: Dependence on supplemental oxygen: Z99.81

## 2015-05-24 HISTORY — DX: Personal history of peptic ulcer disease: Z87.11

## 2015-05-24 HISTORY — DX: Malignant neoplasm of unspecified part of right bronchus or lung: C34.91

## 2015-05-24 HISTORY — DX: Personal history of other diseases of the digestive system: Z87.19

## 2015-05-24 HISTORY — DX: Personal history of other medical treatment: Z92.89

## 2015-05-24 HISTORY — DX: Malignant neoplasm of unspecified part of left bronchus or lung: C34.92

## 2015-05-24 LAB — CBC WITH DIFFERENTIAL/PLATELET
BASOS ABS: 0 10*3/uL (ref 0.0–0.1)
Basophils Relative: 0 %
EOS ABS: 0.1 10*3/uL (ref 0.0–0.7)
EOS PCT: 1 %
HCT: 39.3 % (ref 39.0–52.0)
Hemoglobin: 12.7 g/dL — ABNORMAL LOW (ref 13.0–17.0)
Lymphocytes Relative: 11 %
Lymphs Abs: 1.2 10*3/uL (ref 0.7–4.0)
MCH: 31.1 pg (ref 26.0–34.0)
MCHC: 32.3 g/dL (ref 30.0–36.0)
MCV: 96.1 fL (ref 78.0–100.0)
MONO ABS: 0.8 10*3/uL (ref 0.1–1.0)
Monocytes Relative: 7 %
Neutro Abs: 9.1 10*3/uL — ABNORMAL HIGH (ref 1.7–7.7)
Neutrophils Relative %: 81 %
Platelets: 135 10*3/uL — ABNORMAL LOW (ref 150–400)
RBC: 4.09 MIL/uL — AB (ref 4.22–5.81)
RDW: 14.9 % (ref 11.5–15.5)
WBC: 11.2 10*3/uL — AB (ref 4.0–10.5)

## 2015-05-24 LAB — COMPREHENSIVE METABOLIC PANEL
ALT: 18 U/L (ref 17–63)
AST: 28 U/L (ref 15–41)
Albumin: 3.1 g/dL — ABNORMAL LOW (ref 3.5–5.0)
Alkaline Phosphatase: 61 U/L (ref 38–126)
Anion gap: 13 (ref 5–15)
BUN: 27 mg/dL — AB (ref 6–20)
CHLORIDE: 104 mmol/L (ref 101–111)
CO2: 24 mmol/L (ref 22–32)
CREATININE: 1.25 mg/dL — AB (ref 0.61–1.24)
Calcium: 8.7 mg/dL — ABNORMAL LOW (ref 8.9–10.3)
GFR, EST NON AFRICAN AMERICAN: 53 mL/min — AB (ref >60)
Glucose, Bld: 193 mg/dL — ABNORMAL HIGH (ref 65–99)
POTASSIUM: 3.8 mmol/L (ref 3.5–5.1)
SODIUM: 141 mmol/L (ref 135–145)
Total Bilirubin: 0.7 mg/dL (ref 0.3–1.2)
Total Protein: 5.9 g/dL — ABNORMAL LOW (ref 6.5–8.1)

## 2015-05-24 LAB — I-STAT TROPONIN, ED: TROPONIN I, POC: 0.04 ng/mL (ref 0.00–0.08)

## 2015-05-24 LAB — LIPASE, BLOOD: LIPASE: 24 U/L (ref 22–51)

## 2015-05-24 LAB — I-STAT CG4 LACTIC ACID, ED: LACTIC ACID, VENOUS: 2.98 mmol/L — AB (ref 0.5–2.0)

## 2015-05-24 MED ORDER — SODIUM CHLORIDE 0.9 % IV BOLUS (SEPSIS)
1000.0000 mL | Freq: Once | INTRAVENOUS | Status: AC
  Administered 2015-05-24: 1000 mL via INTRAVENOUS

## 2015-05-24 MED ORDER — SODIUM CHLORIDE 0.9 % IV BOLUS (SEPSIS)
1000.0000 mL | Freq: Once | INTRAVENOUS | Status: AC
  Administered 2015-05-25: 1000 mL via INTRAVENOUS

## 2015-05-24 MED ORDER — ONDANSETRON HCL 4 MG/2ML IJ SOLN
4.0000 mg | Freq: Once | INTRAMUSCULAR | Status: AC
  Administered 2015-05-24: 4 mg via INTRAVENOUS
  Filled 2015-05-24: qty 2

## 2015-05-24 MED ORDER — IOHEXOL 300 MG/ML  SOLN
25.0000 mL | Freq: Once | INTRAMUSCULAR | Status: AC | PRN
  Administered 2015-05-24: 25 mL via ORAL

## 2015-05-24 NOTE — ED Notes (Signed)
MD at bedside. 

## 2015-05-24 NOTE — ED Provider Notes (Signed)
CSN: [id number]     Arrival date & time 05/24/15  2034 History   First MD Initiated Contact with Patient 05/24/15 2054     Chief Complaint  Patient presents with  . Diarrhea  . Emesis     (Consider location/radiation/quality/duration/timing/severity/associated sxs/prior Treatment) HPI Comments: Patient is an 81 year old male with a history of metastatic lung cancer to the brain, currently on some type of chemotherapy which she took about a week ago, atrial fibrillation, COPD, CHF, stroke, alcohol abuse, GI bleeding, DVT who is not currently taking any anticoagulation presents today with a four-day history of severe nausea vomiting unable to hold down any food and diarrhea that started today. Patient is complaining of pain in the right lower quadrant which is worse when he attempts to stand and walk. Loose stools are nonbloody and brown in color. He denies any black stools. Vomitus is stomach contents but denies any hematemesis. Patient states he always has a chronic cough with some mucus but it is unchanged from baseline. In the last few days he's felt more heart palpitations but he states it's fairly normal. His biggest complaint is feeling very thirsty.  Patient is a 81 y.o. male presenting with diarrhea and vomiting. The history is provided by the patient.  Diarrhea Associated symptoms: vomiting   Emesis Associated symptoms: diarrhea     Past Medical History  Diagnosis Date  . Arthritis   . CHF (congestive heart failure)   . COPD (chronic obstructive pulmonary disease)   . Hypertension   . Stroke   . Gout   . Alcohol abuse   . GIB (gastrointestinal bleeding)   . Complication of anesthesia     2004   stopped breathing  3 times  on the table "  . Shortness of breath   . Sleep apnea 09/08/2012    sleep study 08/20/12-ild sleep apnea with an AHI of 7.8/hr and durimg REM 27.2/hr: CPAP 10/04/12-tried nasal pillows and face mask, but couldnt tolerate  . Atrial fibrillation     chronic  on coumadin; monitor 05/20/11- AFib    . DVT, lower extremity      2005  . Hyperlipidemia   . CAD (coronary artery disease)     myoview 10/19/09-diaphragmatic attenuation vs inferior scar without ischemia; echo 09/11/12-Ef 40-45%, L atrium mod to severely dilated  . Radiation 10/18/14-10/28/14    SBRT right lateral lung mass 50 gray  . S/P radiation therapy 01/31/15-02/14/15    Left lower lobe nodule 50GY/32f  . Allergy     PCNS+Rash  . COPD (chronic obstructive pulmonary disease)   . Diabetes mellitus without complication   . Brain cancer 03/03/15 MRI    2.2cm hemrhagic lesion left occipital lobe    Past Surgical History  Procedure Laterality Date  . Total knee arthroplasty    . Colon surgery      colon polyps  . Hip surgery    . 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 Cardiac catheterization  10/11/02    noncritical CAD, EF 45-50%  . Cardiac catheterization  08/22/99    no significant CAD, nl EF   Family History  Problem Relation Age of Onset  . Heart attack Father   . Diabetes Father   . Dementia Mother   . Cancer Sister   . Cancer Brother   . Heart disease Brother   . Cancer Brother   . Cancer Brother   . Cancer Sister  Social History  Substance Use Topics  . Smoking status: Former Smoker    Quit date: 09/08/1984  . Smokeless tobacco: Former Systems developer  . Alcohol Use: No    Review of Systems  Gastrointestinal: Positive for vomiting and diarrhea.  All other systems reviewed and are negative.     Allergies  Penicillins  Home Medications   Prior to Admission medications   Medication Sig Start Date End Date Taking? Authorizing Provider  acetaminophen (TYLENOL) 325 MG tablet Take 2 tablets (650 mg total) by mouth every 4 (four) hours as needed for mild pain (or temp > 99 F). Patient not taking: Reported on 03/16/2015 03/05/15   Shanon Brow L Rinehuls, PA-C  allopurinol (ZYLOPRIM) 100 MG tablet Take 100 mg by mouth  daily.    Historical Provider, MD  Ascorbic Acid (VITAMIN C) 1000 MG tablet Take 1,000 mg by mouth daily.    Historical Provider, MD  ciprofloxacin (CIPRO) 500 MG tablet take 1 tablet by mouth twice a day for 7 days 03/29/15   Historical Provider, MD  dexamethasone (DECADRON) 4 MG tablet Take 1 tablet (4 mg total) by mouth every 6 (six) hours. Patient taking differently: Take 2-4 mg by mouth See admin instructions. Starting 03/25/15: take 1 tablet (4 mg) three times daily for 1 week, then take 1 tablet (4 mg) two times daily for 1 week, then take 1 tablet (4 mg) daily for 1 week, then take 1/2 tablet (2 mg) daily for 1 week, then stop 03/10/15   Kyung Rudd, MD  doxazosin (CARDURA) 4 MG tablet Take 4 mg by mouth daily.    Historical Provider, MD  doxycycline (VIBRAMYCIN) 100 MG capsule Take 100 mg by mouth 2 (two) times daily. 10 day supply filled 03/21/15 03/21/15   Historical Provider, MD  fluticasone (FLONASE) 50 MCG/ACT nasal spray Place 2 sprays into the nose daily as needed for allergies.  09/13/14   Historical Provider, MD  furosemide (LASIX) 40 MG tablet Take 40 mg by mouth 2 (two) times daily.    Historical Provider, MD  lisinopril (PRINIVIL,ZESTRIL) 10 MG tablet Take 10 mg by mouth daily. 03/16/15   Historical Provider, MD  metFORMIN (GLUCOPHAGE) 1000 MG tablet Take 1,000 mg by mouth daily with breakfast.    Historical Provider, MD  metoprolol (LOPRESSOR) 50 MG tablet Take 50 mg by mouth daily. 03/24/15   Historical Provider, MD  metoprolol succinate (TOPROL-XL) 50 MG 24 hr tablet Take 1 tablet (50 mg total) by mouth daily. 03/22/15   Lorretta Harp, MD  mirabegron ER (MYRBETRIQ) 50 MG TB24 tablet Take 50 mg by mouth daily.    Historical Provider, MD  pantoprazole (PROTONIX) 40 MG tablet Take 1 tablet (40 mg total) by mouth at bedtime. Patient not taking: Reported on 05/16/2015 03/05/15   Shanon Brow L Rinehuls, PA-C  pravastatin (PRAVACHOL) 80 MG tablet Take 80 mg by mouth daily.    Historical Provider, MD   zolpidem (AMBIEN) 5 MG tablet Take 1 tablet (5 mg total) by mouth at bedtime as needed for sleep. Patient not taking: Reported on 04/24/2015 03/25/15   Lajean Saver, MD   BP 140/58 mmHg  Pulse 109  Temp(Src) 98.1 F (36.7 C) (Oral)  Resp 30  Ht 6' (1.829 m)  SpO2 99% Physical Exam  Constitutional: He is oriented to person, place, and time. He appears well-developed and well-nourished. No distress.  HENT:  Head: Normocephalic and atraumatic.  Mouth/Throat: Oropharynx is clear and moist. Mucous membranes are dry.  Eyes: Conjunctivae and  EOM are normal. Pupils are equal, round, and reactive to light.  Neck: Normal range of motion. Neck supple.  Cardiovascular: Intact distal pulses.  An irregularly irregular rhythm present. Tachycardia present.   No murmur heard. Pulmonary/Chest: Effort normal and breath sounds normal. No respiratory distress. He has no wheezes. He has no rales.  Abdominal: Soft. He exhibits no distension. Bowel sounds are decreased. There is tenderness in the left lower quadrant. There is no rebound and no guarding.  Protuberant abdomen with midline well-healed abdominal scar  Musculoskeletal: Normal range of motion. He exhibits no edema or tenderness.  Neurological: He is alert and oriented to person, place, and time.  Skin: Skin is warm and dry. No rash noted. No erythema.  Psychiatric: He has a normal mood and affect. His behavior is normal.  Nursing note and vitals reviewed.   ED Course  Procedures (including critical care time) Labs Review Labs Reviewed  CBC WITH DIFFERENTIAL/PLATELET - Abnormal; Notable for the following:    WBC 11.2 (*)    RBC 4.09 (*)    Hemoglobin 12.7 (*)    Platelets 135 (*)    Neutro Abs 9.1 (*)    All other components within normal limits  COMPREHENSIVE METABOLIC PANEL - Abnormal; Notable for the following:    Glucose, Bld 193 (*)    BUN 27 (*)    Creatinine, Ser 1.25 (*)    Calcium 8.7 (*)    Total Protein 5.9 (*)    Albumin  3.1 (*)    GFR calc non Af Amer 53 (*)    All other components within normal limits  I-STAT CG4 LACTIC ACID, ED - Abnormal; Notable for the following:    Lactic Acid, Venous 2.98 (*)    All other components within normal limits  LIPASE, BLOOD  URINALYSIS, ROUTINE W REFLEX MICROSCOPIC (NOT AT [facility])  I-STAT TROPOININ, ED    Imaging Review Dg Abd Acute W/chest  05/24/2015   CLINICAL DATA:  Abdominal pain, nausea and diarrhea 1 day. History of lung cancer.  EXAM: DG ABDOMEN ACUTE W/ 1V CHEST  COMPARISON:  Chest x-ray 03/25/2015  FINDINGS: Lungs are adequately inflated without consolidation or effusion. Cardiomediastinal silhouette is within normal. Remainder of the thorax is unchanged.  Abdominal images including right lateral decubitus film demonstrate a nonobstructive bowel gas pattern with relative paucity of gas within the colon. No evidence of free peritoneal air. There are multiple surgical clips over the left upper quadrant. IVC filter is present with superior tip at the L2-3 level. Moderate degenerative changes of the spine and mild degenerative change of the hips.  IMPRESSION: Nonobstructive bowel gas pattern.  No acute cardiopulmonary disease.   Electronically Signed   By: Marin Olp M.D.   On: 05/24/2015 21:55   I have personally reviewed and evaluated these images and lab results as part of my medical decision-making.   EKG Interpretation   Date/Time:  Wednesday May 24 2015 21:03:57 EDT Ventricular Rate:  115 PR Interval:    QRS Duration: 89 QT Interval:  324 QTC Calculation: 448 R Axis:   40 Text Interpretation:  Atrial fibrillation Paired ventricular premature  complexes Abnormal R-wave progression, early transition Borderline ST  depression, anterolateral leads No significant change since last tracing  Confirmed by Maryan Rued  MD, Loree Fee ([id number]) on 05/24/2015 9:12:03 PM      MDM   Final diagnoses:  None    Patient is an 81 year old male with multiple medical  problems including metastatic lung cancer to the  brain, atrial fibrillation, COPD, CHF who presents today with 4 day history of worsening nausea vomiting unable to keep anything down as well as diarrhea that started today. Patient is complaining of left lower quadrant pain worse when he attempts to get up and walk. He denies any fever, new shortness of breath, changes in his baseline productive cough. He does wear oxygen at home.  On exam patient appears dehydrated. Dry mucous membranes, tachycardia with A. fib RVR. Moderate left lower quadrant tenderness. No peritoneal signs at this time.  Patient does not display any signs of sepsis however concern for severe dehydration possible diverticulitis versus other abdominal pathology.  CBC, CMP, lipase, UA, troponin, lactic acid, EKG, acute abdominal series pending  11:12 PM Patient's labs are consistent with dehydration with a lactic acid of 3, mild cytosis of 11,000 and normal hemoglobin. CMP with acute kidney injury and hyperglycemia. Lipase within normal limits. Troponin within normal limits an acute abdominal series without acute findings. EKG unchanged.  After initial bolus patient is feeling slightly better but still is having persistent left lower quadrant pain. CT to evaluate for diverticulitis.  Blanchie Dessert, MD 05/24/15 [phone]

## 2015-05-24 NOTE — ED Notes (Addendum)
Pt arrives to ED c/o dehydration. Has hx lung cancer. C/o emesis and diarrhea.

## 2015-05-25 ENCOUNTER — Encounter (HOSPITAL_COMMUNITY): Payer: Self-pay | Admitting: Radiology

## 2015-05-25 ENCOUNTER — Emergency Department (HOSPITAL_COMMUNITY)

## 2015-05-25 DIAGNOSIS — R627 Adult failure to thrive: Secondary | ICD-10-CM | POA: Diagnosis present

## 2015-05-25 DIAGNOSIS — R531 Weakness: Secondary | ICD-10-CM

## 2015-05-25 DIAGNOSIS — N179 Acute kidney failure, unspecified: Secondary | ICD-10-CM | POA: Diagnosis not present

## 2015-05-25 DIAGNOSIS — E860 Dehydration: Secondary | ICD-10-CM | POA: Diagnosis not present

## 2015-05-25 DIAGNOSIS — C3411 Malignant neoplasm of upper lobe, right bronchus or lung: Secondary | ICD-10-CM

## 2015-05-25 LAB — GLUCOSE, CAPILLARY
GLUCOSE-CAPILLARY: 85 mg/dL (ref 65–99)
GLUCOSE-CAPILLARY: 122 mg/dL — AB (ref 65–99)
GLUCOSE-CAPILLARY: 136 mg/dL — AB (ref 65–99)
Glucose-Capillary: 121 mg/dL — ABNORMAL HIGH (ref 65–99)

## 2015-05-25 LAB — URINE MICROSCOPIC-ADD ON

## 2015-05-25 LAB — I-STAT CHEM 8, ED
BUN: 34 mg/dL — ABNORMAL HIGH (ref 6–20)
CALCIUM ION: 1.12 mmol/L — AB (ref 1.13–1.30)
CHLORIDE: 104 mmol/L (ref 101–111)
CREATININE: 0.8 mg/dL (ref 0.61–1.24)
GLUCOSE: 98 mg/dL (ref 65–99)
HCT: 35 % — ABNORMAL LOW (ref 39.0–52.0)
Hemoglobin: 11.9 g/dL — ABNORMAL LOW (ref 13.0–17.0)
Potassium: 4.1 mmol/L (ref 3.5–5.1)
SODIUM: 142 mmol/L (ref 135–145)
TCO2: 28 mmol/L (ref 0–100)

## 2015-05-25 LAB — URINALYSIS, ROUTINE W REFLEX MICROSCOPIC
BILIRUBIN URINE: NEGATIVE
Glucose, UA: NEGATIVE mg/dL
HGB URINE DIPSTICK: NEGATIVE
Ketones, ur: 15 mg/dL — AB
Nitrite: NEGATIVE
PROTEIN: NEGATIVE mg/dL
SPECIFIC GRAVITY, URINE: 1.042 — AB (ref 1.005–1.030)
UROBILINOGEN UA: 1 mg/dL (ref 0.0–1.0)
pH: 5 (ref 5.0–8.0)

## 2015-05-25 LAB — I-STAT CG4 LACTIC ACID, ED: Lactic Acid, Venous: 0.99 mmol/L (ref 0.5–2.0)

## 2015-05-25 LAB — CORTISOL: CORTISOL PLASMA: 17.1 ug/dL

## 2015-05-25 MED ORDER — IOHEXOL 300 MG/ML  SOLN
100.0000 mL | Freq: Once | INTRAMUSCULAR | Status: AC | PRN
  Administered 2015-05-25: 100 mL via INTRAVENOUS

## 2015-05-25 MED ORDER — ENOXAPARIN SODIUM 40 MG/0.4ML ~~LOC~~ SOLN
40.0000 mg | Freq: Every day | SUBCUTANEOUS | Status: DC
  Administered 2015-05-25 – 2015-05-26 (×2): 40 mg via SUBCUTANEOUS
  Filled 2015-05-25 (×2): qty 0.4

## 2015-05-25 MED ORDER — ACETAMINOPHEN 325 MG PO TABS: 650.0000 mg | ORAL_TABLET | Freq: Four times a day (QID) | ORAL | Status: DC | PRN

## 2015-05-25 MED ORDER — ACETAMINOPHEN 650 MG RE SUPP: 650.0000 mg | Freq: Four times a day (QID) | RECTAL | Status: DC | PRN

## 2015-05-25 MED ORDER — METOPROLOL SUCCINATE ER 50 MG PO TB24
50.0000 mg | ORAL_TABLET | Freq: Every day | ORAL | Status: DC
  Administered 2015-05-25 – 2015-05-26 (×2): 50 mg via ORAL
  Filled 2015-05-25 (×2): qty 1

## 2015-05-25 MED ORDER — MIRABEGRON ER 50 MG PO TB24
50.0000 mg | ORAL_TABLET | Freq: Every day | ORAL | Status: DC
  Administered 2015-05-25 – 2015-05-26 (×2): 50 mg via ORAL
  Filled 2015-05-25 (×2): qty 1

## 2015-05-25 MED ORDER — PRAVASTATIN SODIUM 40 MG PO TABS
80.0000 mg | ORAL_TABLET | Freq: Every day | ORAL | Status: DC
  Administered 2015-05-25 – 2015-05-26 (×2): 80 mg via ORAL
  Filled 2015-05-25 (×2): qty 2

## 2015-05-25 MED ORDER — SALINE SPRAY 0.65 % NA SOLN
1.0000 | NASAL | Status: DC | PRN
  Administered 2015-05-25: 1 via NASAL
  Filled 2015-05-25: qty 44

## 2015-05-25 MED ORDER — INSULIN ASPART 100 UNIT/ML ~~LOC~~ SOLN: 0.0000 [IU] | Freq: Every day | SUBCUTANEOUS | Status: DC

## 2015-05-25 MED ORDER — SODIUM CHLORIDE 0.9 % IV SOLN
INTRAVENOUS | Status: AC
  Administered 2015-05-25: 08:00:00 via INTRAVENOUS

## 2015-05-25 MED ORDER — DOXAZOSIN MESYLATE 8 MG PO TABS
4.0000 mg | ORAL_TABLET | Freq: Every day | ORAL | Status: DC
  Administered 2015-05-25 – 2015-05-26 (×2): 4 mg via ORAL
  Filled 2015-05-25 (×2): qty 1

## 2015-05-25 MED ORDER — VITAMIN C 500 MG PO TABS
1000.0000 mg | ORAL_TABLET | Freq: Every day | ORAL | Status: DC
  Administered 2015-05-25 – 2015-05-26 (×2): 1000 mg via ORAL
  Filled 2015-05-25 (×2): qty 2

## 2015-05-25 MED ORDER — OXYCODONE HCL 5 MG PO TABS: 5.0000 mg | ORAL_TABLET | ORAL | Status: DC | PRN

## 2015-05-25 MED ORDER — ONDANSETRON 4 MG PO TBDP: 4.0000 mg | ORAL_TABLET | Freq: Three times a day (TID) | ORAL | Status: AC | PRN

## 2015-05-25 MED ORDER — SODIUM CHLORIDE 0.9 % IJ SOLN
3.0000 mL | Freq: Two times a day (BID) | INTRAMUSCULAR | Status: DC
  Administered 2015-05-25 – 2015-05-26 (×2): 3 mL via INTRAVENOUS

## 2015-05-25 MED ORDER — ONDANSETRON HCL 4 MG/2ML IJ SOLN: 4.0000 mg | Freq: Four times a day (QID) | INTRAMUSCULAR | Status: DC | PRN

## 2015-05-25 MED ORDER — ALLOPURINOL 100 MG PO TABS
100.0000 mg | ORAL_TABLET | Freq: Every day | ORAL | Status: DC
  Administered 2015-05-25 – 2015-05-26 (×2): 100 mg via ORAL
  Filled 2015-05-25 (×2): qty 1

## 2015-05-25 MED ORDER — PANTOPRAZOLE SODIUM 40 MG PO TBEC
40.0000 mg | DELAYED_RELEASE_TABLET | Freq: Every day | ORAL | Status: DC
  Administered 2015-05-25: 40 mg via ORAL
  Filled 2015-05-25: qty 1

## 2015-05-25 MED ORDER — HYDROMORPHONE HCL 1 MG/ML IJ SOLN: 0.5000 mg | INTRAMUSCULAR | Status: DC | PRN

## 2015-05-25 MED ORDER — ONDANSETRON HCL 4 MG PO TABS
4.0000 mg | ORAL_TABLET | Freq: Four times a day (QID) | ORAL | Status: DC | PRN
Start: 2015-05-25 — End: 2015-05-26

## 2015-05-25 MED ORDER — INSULIN ASPART 100 UNIT/ML ~~LOC~~ SOLN
0.0000 [IU] | Freq: Three times a day (TID) | SUBCUTANEOUS | Status: DC
  Administered 2015-05-25 (×2): 1 [IU] via SUBCUTANEOUS

## 2015-05-25 MED ORDER — SODIUM CHLORIDE 0.9 % IV BOLUS (SEPSIS)
1000.0000 mL | Freq: Once | INTRAVENOUS | Status: AC
  Administered 2015-05-25: 1000 mL via INTRAVENOUS

## 2015-05-25 MED ORDER — BOOST / RESOURCE BREEZE PO LIQD
1.0000 | Freq: Three times a day (TID) | ORAL | Status: DC
Start: 2015-05-25 — End: 2015-05-26
  Administered 2015-05-25 – 2015-05-26 (×3): 1 via ORAL

## 2015-05-25 NOTE — Progress Notes (Signed)
[facility] PROGRESS NOTE   Raymond Chaney FIE:[id number] DOB: 01-19-1932 DOA: 05/24/2015 PCP: Tivis Ringer, MD  HPI/Subjective: Feels better, denies specific complaints. Reported that his abdominal pain resolved.  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 Arnoldo Morale. History of metastatic lung cancer came in with generalized weakness, abdominal pain and poor oral intake. Labs showed acute kidney injury and slightly elevated lactic acid. Started on aggressive hydration with IV fluids. Follow BMP in a.m. CT scan reviewed, no  Code Status: Full Code Family Communication: Plan discussed with the patient. Disposition Plan: Remains inpatient Diet: Diet clear liquid Room service appropriate?: Yes; Fluid consistency:: Thin  Consultants: None   Procedures:  None  Antibiotics:  None   Objective: Filed Vitals:   05/25/15 1144  BP: 88/42  Pulse:   Temp: 97.7 F (36.5 C)  Resp: 20    Intake/Output Summary (Last 24 hours) at 05/25/15 1206 Last data filed at 05/25/15 1000  Gross per 24 hour  Intake 368.75 ml  Output    650 ml  Net -281.25 ml   Filed Weights   05/25/15 0649  Weight: 112.9 kg (248 lb 14.4 oz)    Exam: General: Alert and awake, oriented x3, not in any acute distress. HEENT: anicteric sclera, pupils reactive to light and accommodation, EOMI CVS: S1-S2 clear, no murmur rubs or gallops Chest: clear to auscultation bilaterally, no wheezing, rales or rhonchi Abdomen: soft nontender, nondistended, normal bowel sounds, no  organomegaly Extremities: no cyanosis, clubbing or edema noted bilaterally Neuro: Cranial nerves II-XII intact, no focal neurological deficits  Data Reviewed: Basic Metabolic Panel:  Recent Labs Lab 05/24/15 2117 05/25/15 0504  NA 141 142  K 3.8 4.1  CL 104 104  CO2 24  --   GLUCOSE 193* 98  BUN 27* 34*  CREATININE 1.25* 0.80  CALCIUM 8.7*  --    Liver Function Tests:  Recent Labs Lab 05/24/15 2117  AST 28  ALT 18  ALKPHOS 61  BILITOT 0.7  PROT 5.9*  ALBUMIN 3.1*    Recent Labs Lab 05/24/15 2117  LIPASE 24   No results for input(s): AMMONIA in the last 168 hours. CBC:  Recent Labs Lab 05/24/15 2117 05/25/15 0504  WBC 11.2*  --   NEUTROABS 9.1*  --   HGB 12.7* 11.9*  HCT 39.3 35.0*  MCV 96.1  --   PLT 135*  --    Cardiac Enzymes: No results for input(s): CKTOTAL, CKMB, CKMBINDEX, TROPONINI in the last 168 hours. BNP (last 3 results) No results for input(s): BNP in the last 8760 hours.  ProBNP (last 3 results) No results for input(s): PROBNP in the last 8760 hours.  CBG:  Recent Labs Lab 05/25/15 0724 05/25/15 1138  GLUCAP 85 121*    Micro No results found for this or any previous visit (from the past 240 hour(s)).   Studies: Ct Abdomen Pelvis W Contrast  05/25/2015   CLINICAL DATA:  81 year old male with left lower quadrant abdominal pain and nausea.  EXAM: CT ABDOMEN AND PELVIS WITH  CONTRAST  TECHNIQUE: Multidetector CT imaging of the abdomen and pelvis was performed using the standard protocol following bolus administration of intravenous contrast.  CONTRAST:  145m OMNIPAQUE IOHEXOL 300 MG/ML  SOLN  COMPARISON:  Radiograph dated 05/24/2015  FINDINGS: The visualized lung bases are clear. There is coronary vascular calcification. No intra-abdominal free air or free fluid.  The liver, gallbladder, pancreas, spleen, adrenal glands, kidneys, visualized ureters, and urinary bladder appear unremarkable. There are calcifications of the prostate  gland. Multiple surgical clips noted in the upper abdomen.  There is sigmoid diverticulosis with muscular hypertrophy. No active inflammation. There are scattered colonic diverticula without acute inflammatory changes. There is no evidence of bowel obstruction. Normal appendix.  There is aortoiliac atherosclerotic disease. The abdominal aorta is tortuous. There is a 2.7 cm infrarenal abdominal aortic ectasia. The origins of the celiac axis, SMA, IMA remain patent. No portal venous gas identified. An infrarenal IVC filter noted. There is no adenopathy.  Midline vertical anterior pelvic wall incisional scar. There is abutment of loops of small bowel to the anterior peritoneal wall compatible with adhesions. There is extensive degenerative changes of the spine. No acute fracture.  IMPRESSION: Sigmoid diverticulosis. No definite active inflammation. No bowel obstruction. Normal appendix.   Electronically Signed   By: AAnner CreteM.D.   On: 05/25/2015 01:36   Dg Abd Acute W/chest  05/24/2015   CLINICAL DATA:  Abdominal pain, nausea and diarrhea 1 day. History of lung cancer.  EXAM: DG ABDOMEN ACUTE W/ 1V CHEST  COMPARISON:  Chest x-ray 03/25/2015  FINDINGS: Lungs are adequately inflated without consolidation or effusion. Cardiomediastinal silhouette is within normal. Remainder of the thorax is unchanged.  Abdominal images including right lateral decubitus film demonstrate a nonobstructive bowel gas pattern with relative paucity of gas within the colon. No evidence of free peritoneal air. There are multiple surgical clips over the left upper quadrant. IVC filter is present with superior tip at the L2-3 level. Moderate degenerative changes of the spine and mild degenerative change of the hips.  IMPRESSION: Nonobstructive bowel gas pattern.  No acute cardiopulmonary disease.   Electronically Signed   By: DMarin OlpM.D.   On: 05/24/2015 21:55    Scheduled Meds: . allopurinol  100 mg Oral Daily  . doxazosin   4 mg Oral Daily  . enoxaparin (LOVENOX) injection  40 mg Subcutaneous Daily  . insulin aspart  0-5 Units Subcutaneous QHS  . insulin aspart  0-9 Units Subcutaneous TID WC  . metoprolol succinate  50 mg Oral Daily  . mirabegron ER  50 mg Oral Daily  . pantoprazole  40 mg Oral QHS  . pravastatin  80 mg Oral Daily  . sodium chloride  3 mL Intravenous Q12H  . vitamin C  1,000 mg Oral Daily   Continuous Infusions: . sodium chloride 75 mL/hr at 05/25/15 0817       Time spent: 35 minutes    EMenom[facility] CenterA  [facility] Pager [phone] 7PM-7AM, please contact night-coverage at [email or web address], password [email or web address]9/15/2016, 12:06 PM  LOS: 0 days

## 2015-05-25 NOTE — H&P (Addendum)
[facility] Admission History and Physical       Raymond Chaney WLS:[id number] DOB: 09/22/1931 DOA: 05/24/2015  Referring physician: EDP  PCP: Tivis Ringer, MD  Specialists:   Chief Complaint: Weakness  HPI: Raymond Chaney is a 81 y.o. male with a history of Metastatic Lung Cancer whose last Chemotherapy was 1 month ago who presents to the Ed with complaints of increasing weakness and ABD pain and poor intake of foods and liquids.   He reports falling in his home recent but without injuries.   He was evaluated in the ED and an Acute ABD series and  Ct scan of the ABD were both negative for acute findings.   He was found to have a mildly increased BUN/Cr and was referred for admission.      Review of Systems:  Constitutional: No Weight Loss, No Weight Gain, Night Sweats, Fevers, Chills, Dizziness, Light Headedness, +Fatigue, +Generalized Weakness HEENT: No Headaches, Difficulty Swallowing,Tooth/Dental Problems,Sore Throat,  No Sneezing, Rhinitis, Ear Ache, Nasal Congestion, or Post Nasal Drip,  Cardio-vascular:  No Chest pain, Orthopnea, PND, Edema in Lower Extremities, Anasarca, Dizziness, Palpitations  Resp: No Dyspnea, No DOE, No Productive Cough, No Non-Productive Cough, No Hemoptysis, No Wheezing.    GI: No Heartburn, Indigestion, +Abdominal Pain, Nausea, Vomiting, Diarrhea, Constipation, Hematemesis, Hematochezia, Melena, Change in Bowel Habits,  +Loss of Appetite  GU: No Dysuria, No Change in Color of Urine, No Urgency or Urinary Frequency, No Flank pain.  Musculoskeletal: No Joint Pain or Swelling, No Decreased Range of Motion, No Back Pain.  Neurologic: No Syncope, No Seizures, Muscle Weakness, Paresthesia, Vision Disturbance or Loss, No Diplopia, No Vertigo, No Difficulty Walking,  Skin: No Rash or Lesions. Psych: No Change in Mood or Affect, No Depression or Anxiety, No Memory loss, No Confusion, or Hallucinations   Past Medical History  Diagnosis  Date  . Arthritis   . CHF (congestive heart failure)   . COPD (chronic obstructive pulmonary disease)   . Hypertension   . Stroke   . Gout   . Alcohol abuse   . GIB (gastrointestinal bleeding)   . Complication of anesthesia     2004   stopped breathing  3 times  on the table "  . Shortness of breath   . Sleep apnea 09/08/2012    sleep study 08/20/12-ild sleep apnea with an AHI of 7.8/hr and durimg REM 27.2/hr: CPAP 10/04/12-tried nasal pillows and face mask, but couldnt tolerate  . Atrial fibrillation     chronic on coumadin; monitor 05/20/11- AFib    . DVT, lower extremity      2005  . Hyperlipidemia   . CAD (coronary artery disease)     myoview 10/19/09-diaphragmatic attenuation vs inferior scar without ischemia; echo 09/11/12-Ef 40-45%, L atrium mod to severely dilated  . Radiation 10/18/14-10/28/14    SBRT right lateral lung mass 50 gray  . S/P radiation therapy 01/31/15-02/14/15    Left lower lobe nodule 50GY/24f  . Allergy     PCNS+Rash  . COPD (chronic obstructive pulmonary disease)   . Diabetes mellitus without complication   . Brain cancer 03/03/15 MRI    2.2cm hemrhagic lesion left occipital lobe      Past Surgical History  Procedure Laterality Date  . Total knee arthroplasty    . Colon surgery      colon polyps  . Hip surgery    . Cardiac catheterization  05/01/07    noncritical CAD, anomalous circ arising from the RCA, nl  EF  . Cardiac catheterization  10/11/02    noncritical CAD, EF 45-50%  . Cardiac catheterization  08/22/99    no significant CAD, nl EF      Prior to Admission medications   Medication Sig Start Date End Date Taking? Authorizing Provider  acetaminophen (TYLENOL) 325 MG tablet Take 2 tablets (650 mg total) by mouth every 4 (four) hours as needed for mild pain (or temp > 99 F). Patient not taking: Reported on 03/16/2015 03/05/15   Shanon Brow L Rinehuls, PA-C  allopurinol (ZYLOPRIM) 100 MG tablet Take 100 mg by mouth daily.    Historical Provider, MD    Ascorbic Acid (VITAMIN C) 1000 MG tablet Take 1,000 mg by mouth daily.    Historical Provider, MD  ciprofloxacin (CIPRO) 500 MG tablet take 1 tablet by mouth twice a day for 7 days 03/29/15   Historical Provider, MD  dexamethasone (DECADRON) 4 MG tablet Take 1 tablet (4 mg total) by mouth every 6 (six) hours. Patient taking differently: Take 2-4 mg by mouth See admin instructions. Starting 03/25/15: take 1 tablet (4 mg) three times daily for 1 week, then take 1 tablet (4 mg) two times daily for 1 week, then take 1 tablet (4 mg) daily for 1 week, then take 1/2 tablet (2 mg) daily for 1 week, then stop 03/10/15   Kyung Rudd, MD  doxazosin (CARDURA) 4 MG tablet Take 4 mg by mouth daily.    Historical Provider, MD  doxycycline (VIBRAMYCIN) 100 MG capsule Take 100 mg by mouth 2 (two) times daily. 10 day supply filled 03/21/15 03/21/15   Historical Provider, MD  fluticasone (FLONASE) 50 MCG/ACT nasal spray Place 2 sprays into the nose daily as needed for allergies.  09/13/14   Historical Provider, MD  furosemide (LASIX) 40 MG tablet Take 40 mg by mouth 2 (two) times daily.    Historical Provider, MD  lisinopril (PRINIVIL,ZESTRIL) 10 MG tablet Take 10 mg by mouth daily. 03/16/15   Historical Provider, MD  metFORMIN (GLUCOPHAGE) 1000 MG tablet Take 1,000 mg by mouth daily with breakfast.    Historical Provider, MD  metoprolol (LOPRESSOR) 50 MG tablet Take 50 mg by mouth daily. 03/24/15   Historical Provider, MD  metoprolol succinate (TOPROL-XL) 50 MG 24 hr tablet Take 1 tablet (50 mg total) by mouth daily. 03/22/15   Lorretta Harp, MD  mirabegron ER (MYRBETRIQ) 50 MG TB24 tablet Take 50 mg by mouth daily.    Historical Provider, MD  ondansetron (ZOFRAN ODT) 4 MG disintegrating tablet Take 1 tablet (4 mg total) by mouth every 8 (eight) hours as needed for nausea or vomiting. 05/25/15   Everlene Balls, MD  pantoprazole (PROTONIX) 40 MG tablet Take 1 tablet (40 mg total) by mouth at bedtime. Patient not taking: Reported on  05/16/2015 03/05/15   Shanon Brow L Rinehuls, PA-C  pravastatin (PRAVACHOL) 80 MG tablet Take 80 mg by mouth daily.    Historical Provider, MD  zolpidem (AMBIEN) 5 MG tablet Take 1 tablet (5 mg total) by mouth at bedtime as needed for sleep. Patient not taking: Reported on 04/24/2015 03/25/15   Lajean Saver, MD     Allergies  Allergen Reactions  . Penicillins Rash    Social History:  reports that he quit smoking about 30 years ago. He has quit using smokeless tobacco. He reports that he does not drink alcohol or use illicit drugs.    Family History  Problem Relation Age of Onset  . Heart attack Father   .  Diabetes Father   . Dementia Mother   . Cancer Sister   . Cancer Brother   . Heart disease Brother   . Cancer Brother   . Cancer Brother   . Cancer Sister        Physical Exam:  GEN:  Pleasant Obese  81 y.o. Caucasian male examined and in no acute distress; cooperative with exam Filed Vitals:   05/25/15 0344 05/25/15 0400 05/25/15 0500 05/25/15 0530  BP: 102/89 116/63 109/43 119/59  Pulse: 96 106 102 103  Temp:      TempSrc:      Resp: '20 23 16 16  '$ Height:      SpO2: 100% 97% 100% 100%   Blood pressure 119/59, pulse 103, temperature 98.1 F (36.7 C), temperature source Oral, resp. rate 16, height 6' (1.829 m), SpO2 100 %. PSYCH: He is alert and oriented x4; does not appear anxious does not appear depressed; affect is normal HEENT: Normocephalic and Atraumatic, Mucous membranes pink; PERRLA; EOM intact; Fundi:  Benign;  No scleral icterus, Nares: Patent, Oropharynx: Clear,  Fair Dentition,    Neck:  FROM, No Cervical Lymphadenopathy nor Thyromegaly or Carotid Bruit; No JVD; Breasts:: Not examined CHEST WALL: No tenderness CHEST: Normal respiration, clear to auscultation bilaterally HEART: Regular rate and rhythm; no murmurs rubs or gallops BACK: No kyphosis or scoliosis; No CVA tenderness ABDOMEN: Positive Bowel Sounds, Obese, Soft Non-Tender, No Rebound or Guarding; No  Masses, No Organomegaly. Rectal Exam: Not done EXTREMITIES: No Cyanosis, Clubbing, or Edema; No Ulcerations. Genitalia: not examined PULSES: 2+ and symmetric SKIN: Normal hydration no rash or ulceration CNS:  Alert and Oriented x 4, No Focal Deficits Vascular: pulses palpable throughout    Labs on Admission:  Basic Metabolic Panel:  Recent Labs Lab 05/24/15 2117 05/25/15 0504  NA 141 142  K 3.8 4.1  CL 104 104  CO2 24  --   GLUCOSE 193* 98  BUN 27* 34*  CREATININE 1.25* 0.80  CALCIUM 8.7*  --    Liver Function Tests:  Recent Labs Lab 05/24/15 2117  AST 28  ALT 18  ALKPHOS 61  BILITOT 0.7  PROT 5.9*  ALBUMIN 3.1*    Recent Labs Lab 05/24/15 2117  LIPASE 24   No results for input(s): AMMONIA in the last 168 hours. CBC:  Recent Labs Lab 05/24/15 2117 05/25/15 0504  WBC 11.2*  --   NEUTROABS 9.1*  --   HGB 12.7* 11.9*  HCT 39.3 35.0*  MCV 96.1  --   PLT 135*  --    Cardiac Enzymes: No results for input(s): CKTOTAL, CKMB, CKMBINDEX, TROPONINI in the last 168 hours.  BNP (last 3 results) No results for input(s): BNP in the last 8760 hours.  ProBNP (last 3 results) No results for input(s): PROBNP in the last 8760 hours.  CBG: No results for input(s): GLUCAP in the last 168 hours.  Radiological Exams on Admission: Ct Abdomen Pelvis W Contrast  05/25/2015   CLINICAL DATA:  81 year old male with left lower quadrant abdominal pain and nausea.  EXAM: CT ABDOMEN AND PELVIS WITH CONTRAST  TECHNIQUE: Multidetector CT imaging of the abdomen and pelvis was performed using the standard protocol following bolus administration of intravenous contrast.  CONTRAST:  162m OMNIPAQUE IOHEXOL 300 MG/ML  SOLN  COMPARISON:  Radiograph dated 05/24/2015  FINDINGS: The visualized lung bases are clear. There is coronary vascular calcification. No intra-abdominal free air or free fluid.  The liver, gallbladder, pancreas, spleen, adrenal glands, kidneys, visualized  ureters, and  urinary bladder appear unremarkable. There are calcifications of the prostate gland. Multiple surgical clips noted in the upper abdomen.  There is sigmoid diverticulosis with muscular hypertrophy. No active inflammation. There are scattered colonic diverticula without acute inflammatory changes. There is no evidence of bowel obstruction. Normal appendix.  There is aortoiliac atherosclerotic disease. The abdominal aorta is tortuous. There is a 2.7 cm infrarenal abdominal aortic ectasia. The origins of the celiac axis, SMA, IMA remain patent. No portal venous gas identified. An infrarenal IVC filter noted. There is no adenopathy.  Midline vertical anterior pelvic wall incisional scar. There is abutment of loops of small bowel to the anterior peritoneal wall compatible with adhesions. There is extensive degenerative changes of the spine. No acute fracture.  IMPRESSION: Sigmoid diverticulosis. No definite active inflammation. No bowel obstruction. Normal appendix.   Electronically Signed   By: Anner Crete M.D.   On: 05/25/2015 01:36   Dg Abd Acute W/chest  05/24/2015   CLINICAL DATA:  Abdominal pain, nausea and diarrhea 1 day. History of lung cancer.  EXAM: DG ABDOMEN ACUTE W/ 1V CHEST  COMPARISON:  Chest x-ray 03/25/2015  FINDINGS: Lungs are adequately inflated without consolidation or effusion. Cardiomediastinal silhouette is within normal. Remainder of the thorax is unchanged.  Abdominal images including right lateral decubitus film demonstrate a nonobstructive bowel gas pattern with relative paucity of gas within the colon. No evidence of free peritoneal air. There are multiple surgical clips over the left upper quadrant. IVC filter is present with superior tip at the L2-3 level. Moderate degenerative changes of the spine and mild degenerative change of the hips.  IMPRESSION: Nonobstructive bowel gas pattern.  No acute cardiopulmonary disease.   Electronically Signed   By: Marin Olp M.D.   On: 05/24/2015  21:55     Assessment/Plan:   81 y.o. male with  1.    AKI/Dehydration   IVFs ordered for rehydration   Monitor BUN/Cr   2.   Weakness - due to #1   Check Cortisol Level    Due to Recent Decadron Rx    May Need Stress Dose Steroids    3.   Failure to Thrive-   Nutrition Consultation   Clear liquid Diet advance as tolerated to Heart Healthy   4.   Metastatic Lung Cancer   Notify Oncology    5.   Atrial fibrillation   Continue Metoprolol   Not and Coumadin Candidate due to hx of brain Bleed   6.   Diabetes mellitus type 2 in obese   Hold Metformin Rx   SSI coverage PRN   Check HbA1C   7.   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   Essential hypertension   Continue Metoprolol Rx as BP and HR tolerate   Monitor BPs   9.   DVT Prophylaxis   Lovenox    Code Status:     FULL CODE       Family Communication:   No Family Present    Disposition Plan:    Return Home in 2-3 days        Time spent:  [place] [facility] Pager [phone]   If [place] Please Contact the Day Rounding Team MD for [facility]  If 7PM-7AM, Please Contact Night-Floor Coverage  [email or web address] Password  [email or web address] 05/25/2015, 6:23 AM     ADDENDUM:   Patient was seen and examined on 05/25/2015

## 2015-05-25 NOTE — Progress Notes (Signed)
Initial Nutrition Assessment  DOCUMENTATION CODES:   Obesity unspecified  INTERVENTION:  Boost Breeze po TID, each supplement provides 250 kcal and 9 grams of protein Diet advancement per MD When diet advanced, recommend Magic Cup ice cream with meals   NUTRITION DIAGNOSIS:   Inadequate oral intake related to cancer and cancer related treatments, poor appetite as evidenced by per patient/family report, mild depletion of muscle mass, percent weight loss.   GOAL:   Patient will meet greater than or equal to 90% of their needs   MONITOR:   Diet advancement, PO intake, Supplement acceptance, Skin, Labs  REASON FOR ASSESSMENT:   Malnutrition Screening Tool    ASSESSMENT:   81 y.o. male with a history of Metastatic Lung Cancer whose last Chemotherapy was 1 month ago who presents to the Ed with complaints of increasing weakness and ABD pain and poor intake of foods and liquids.  Pt reports that he has lost 15 lbs in the past 3-4 months due to poor appetite and taste changes. Per pt's wife, pt has been complaining about everything tasting bad. Pt denies any nausea or abdominal pain at time of visit. Pt took several sips of Boost Breeze and time of visit. Wife reports pt drank everything on clear liquid lunch tray.  Pt has lost 6% of his body weight within the past 3-4 months, not significant for time frame.   Labs reviewed.    Diet Order:  Diet clear liquid Room service appropriate?: Yes; Fluid consistency:: Thin  Skin:  Reviewed, no issues  Last BM:  9/15  Height:   Ht Readings from Last 1 Encounters:  05/25/15 6' (1.829 m)    Weight:   Wt Readings from Last 1 Encounters:  05/25/15 248 lb 14.4 oz (112.9 kg)    Ideal Body Weight:  80.9 kg  BMI:  Body mass index is 33.75 kg/(m^2).  Estimated Nutritional Needs:   Kcal:  2000-2200  Protein:  120-130 grams  Fluid:  2-2.2 L/day  EDUCATION NEEDS:   No education needs identified at this time  [place], LDN Inpatient Clinical Dietitian Pager: [phone] After Hours Pager: [phone]

## 2015-05-25 NOTE — Progress Notes (Signed)
[place] Room 18-Xai Gearin-[facility] RN Visit-Stacie Delice Lesch RN, BSN  This is related admission to [facility] diagnosis of Lung Cancer.  Patient is a FULL CODE.  Patient seen in room with wife, Izora Gala at bedside.  Izora Gala reports that patient has not been eating or drinking for the past couple days.  She called EMS after she noticed an increase in lethargy and he would not get off of the couch. Patient soundly sleeping during visit.  Wife reported he ate 100% of his clear liquid diet.  Per chart review, patient has had no complaints of pain.  Patient is receiving IVF via a PIV at the rate of 72m/hr.  [facility] medication list and transfer summary placed on chart.  [facility] will continue to follow and anticipate any discharge needs.  Per NIzora Gala the plan is to take patient back home with continued [facility] services.  Please call with any questions. SAnnia BeltRN, BLinwood HospitalLiaison ([phone]

## 2015-05-25 NOTE — Discharge Instructions (Signed)
Nausea and Vomiting Raymond Chaney, your CT scan today did not show diverticulitis.  You were given IV fluids because you were a little dehydrated.  Be sure to drink plenty of water and take nausea medication as needed.  See your primary doctor within 3 days for close follow up.  If any symptoms worsen, come back to the ED immediately.  Thank you. Nausea means you feel sick to your stomach. Throwing up (vomiting) is a reflex where stomach contents come out of your mouth. HOME CARE   Take medicine as told by your doctor.  Do not force yourself to eat. However, you do need to drink fluids.  If you feel like eating, eat a normal diet as told by your doctor.  Eat rice, wheat, potatoes, bread, lean meats, yogurt, fruits, and vegetables.  Avoid high-fat foods.  Drink enough fluids to keep your pee (urine) clear or pale yellow.  Ask your doctor how to replace body fluid losses (rehydrate). Signs of body fluid loss (dehydration) include:  Feeling very thirsty.  Dry lips and mouth.  Feeling dizzy.  Dark pee.  Peeing less than normal.  Feeling confused.  Fast breathing or heart rate. GET HELP RIGHT AWAY IF:   You have blood in your throw up.  You have black or bloody poop (stool).  You have a bad headache or stiff neck.  You feel confused.  You have bad belly (abdominal) pain.  You have chest pain or trouble breathing.  You do not pee at least once every 8 hours.  You have cold, clammy skin.  You keep throwing up after 24 to 48 hours.  You have a fever. MAKE SURE YOU:   Understand these instructions.  Will watch your condition.  Will get help right away if you are not doing well or get worse. Document Released: 02/12/2008 Document Revised: 11/18/2011 Document Reviewed: 01/25/2011 [facility] Patient Information 2015 [place], [place]. This information is not intended to replace advice given to you by your health care provider. Make sure you discuss any questions you have  with your health care provider. Abdominal Pain Many things can cause belly (abdominal) pain. Most times, the belly pain is not dangerous. Many cases of belly pain can be watched and treated at home. HOME CARE   Do not take medicines that help you go poop (laxatives) unless told to by your doctor.  Only take medicine as told by your doctor.  Eat or drink as told by your doctor. Your doctor will tell you if you should be on a special diet. GET HELP IF:  You do not know what is causing your belly pain.  You have belly pain while you are sick to your stomach (nauseous) or have runny poop (diarrhea).  You have pain while you pee or poop.  Your belly pain wakes you up at night.  You have belly pain that gets worse or better when you eat.  You have belly pain that gets worse when you eat fatty foods.  You have a fever. GET HELP RIGHT AWAY IF:   The pain does not go away within 2 hours.  You keep throwing up (vomiting).  The pain changes and is only in the right or left part of the belly.  You have bloody or tarry looking poop. MAKE SURE YOU:   Understand these instructions.  Will watch your condition.  Will get help right away if you are not doing well or get worse. Document Released: 02/12/2008 Document Revised: 08/31/2013 Document  Reviewed: 05/05/2013 [employer] Patient Information 2015 [place], [place]. This information is not intended to replace advice given to you by your health care provider. Make sure you discuss any questions you have with your health care provider.

## 2015-05-25 NOTE — ED Notes (Signed)
Patient is resting comfortably. 

## 2015-05-25 NOTE — ED Notes (Signed)
Unable to give report at this time.

## 2015-05-26 DIAGNOSIS — E860 Dehydration: Secondary | ICD-10-CM | POA: Diagnosis not present

## 2015-05-26 DIAGNOSIS — I2510 Atherosclerotic heart disease of native coronary artery without angina pectoris: Secondary | ICD-10-CM

## 2015-05-26 DIAGNOSIS — I4891 Unspecified atrial fibrillation: Secondary | ICD-10-CM

## 2015-05-26 DIAGNOSIS — R627 Adult failure to thrive: Secondary | ICD-10-CM

## 2015-05-26 DIAGNOSIS — N179 Acute kidney failure, unspecified: Secondary | ICD-10-CM | POA: Diagnosis not present

## 2015-05-26 DIAGNOSIS — I10 Essential (primary) hypertension: Secondary | ICD-10-CM

## 2015-05-26 DIAGNOSIS — E669 Obesity, unspecified: Secondary | ICD-10-CM

## 2015-05-26 DIAGNOSIS — E119 Type 2 diabetes mellitus without complications: Secondary | ICD-10-CM

## 2015-05-26 LAB — CBC
HCT: 30.1 % — ABNORMAL LOW (ref 39.0–52.0)
Hemoglobin: 9.8 g/dL — ABNORMAL LOW (ref 13.0–17.0)
MCH: 31.5 pg (ref 26.0–34.0)
MCHC: 32.6 g/dL (ref 30.0–36.0)
MCV: 96.8 fL (ref 78.0–100.0)
PLATELETS: 90 10*3/uL — AB (ref 150–400)
RBC: 3.11 MIL/uL — AB (ref 4.22–5.81)
RDW: 14.9 % (ref 11.5–15.5)
WBC: 6.9 10*3/uL (ref 4.0–10.5)

## 2015-05-26 LAB — BASIC METABOLIC PANEL
Anion gap: 8 (ref 5–15)
BUN: 7 mg/dL (ref 6–20)
CALCIUM: 7.9 mg/dL — AB (ref 8.9–10.3)
CHLORIDE: 109 mmol/L (ref 101–111)
CO2: 24 mmol/L (ref 22–32)
CREATININE: 0.7 mg/dL (ref 0.61–1.24)
GFR calc non Af Amer: >60 mL/min (ref >60)
Glucose, Bld: 94 mg/dL (ref 65–99)
Potassium: 3.6 mmol/L (ref 3.5–5.1)
SODIUM: 141 mmol/L (ref 135–145)

## 2015-05-26 LAB — GLUCOSE, CAPILLARY
GLUCOSE-CAPILLARY: 94 mg/dL (ref 65–99)
GLUCOSE-CAPILLARY: 107 mg/dL — AB (ref 65–99)
Glucose-Capillary: 105 mg/dL — ABNORMAL HIGH (ref 65–99)

## 2015-05-26 NOTE — Progress Notes (Signed)
[place] Room 18-[name]-[facility] RN Visit-Stacie Delice Lesch RN, BSN  This is related admission to [facility] diagnosis of Lung Cancer. Patient is a FULL CODE. Patient seen in room with wife, Raymond Chaney at bedside. Patient alert, oriented to person and situation.  Patient verbalized he is comfortable.  He stated his frustration with laying in bed and not being able to get up as he wishes.  Raymond Chaney stated she is concerned to take him home until he is able to ambulate again.  DME discussed.  She stated they may need a BSC, once he his back in the home.  [facility] will be able to order this when patient is ready.  Patient is currently on 2L [place].  He is continuing to receive NS IV at a rate of 40m/hr.  [facility] will continue to follow and anticipate any discharge needs. Per [name] the plan is to take patient back home with continued [facility] services.  Please call with any questions. SAnnia BeltRN, [facility] Hospital[facility]ison ([phone]

## 2015-05-26 NOTE — Progress Notes (Signed)
Patient is active with [facility]. [facility] called and made aware of discharge home today. Mindi Slicker [facility] [phone]

## 2015-05-26 NOTE — Discharge Summary (Signed)
Physician Discharge Summary  Raymond Chaney:[id number] DOB: [birth year] DOA: 05/24/2015  PCP: Tivis Ringer, MD  Admit date: 05/24/2015 Discharge date: 05/26/2015  Time spent: 40 minutes  Recommendations for Outpatient Follow-up:  1. Follow-up with primary care physician one week.  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 male with a history of Metastatic Lung Cancer whose last Chemotherapy was 1 month ago who presents to the Ed with complaints of increasing weakness and ABD pain and poor intake of foods and liquids. He reports falling in his home recent but without injuries. He was evaluated in the ED and an Acute ABD series and Ct scan of the ABD were both negative for acute findings. He was found to have a mildly increased BUN/Cr and was referred for admission.  Hospital Course:   AKI/Dehydration Patient presented to the hospital with poor fluid intake. He appeared dehydrated clinically, blood work showed BUN of 27 and a creatinine of 1.25. Started on IV fluids, creatinine improved to 0.7 on day of discharge.  Weakness - due to #1 Likely secondary to the dehydration and acute kidney injury. Patient is on dexamethasone, cortisol level is 17.1.  Failure to Thrive Nutrition Consultation Clear liquid Diet advance as tolerated to Heart Healthy  Metastatic Lung Cancer Notify  Oncology   Atrial fibrillation Continue Metoprolol Not and Coumadin Candidate due to hx of brain Bleed  Dabetes mellitus type 2 in obese Metformin held on admission, patient placed on carbohydrate modified diet. Also placed on his sliding scale while in the hospital. Discharge medication restarted.  Essential hypertension Home medications continued.   Procedures:  None  Consultations:  None  Discharge Exam: Filed Vitals:   05/26/15 0946  BP: 117/59  Pulse: 93  Temp:   Resp:    General: Alert and awake, oriented x3, not in any acute distress. HEENT: anicteric sclera, pupils reactive to light and accommodation, EOMI CVS: S1-S2 clear, no murmur rubs or gallops Chest: clear to auscultation bilaterally, no wheezing, rales or rhonchi Abdomen: soft nontender, nondistended, normal bowel sounds, no organomegaly Extremities: no cyanosis, clubbing or edema noted bilaterally Neuro: Cranial nerves II-XII intact, no focal neurological deficits  Discharge Instructions   Discharge Instructions    Diet - low sodium heart healthy    Complete by:  As directed      Increase activity slowly    Complete by:  As directed           Current Discharge Medication List    START taking these medications   Details  ondansetron (ZOFRAN ODT) 4 MG disintegrating tablet Take 1 tablet (4 mg total) by mouth every 8 (eight) hours as needed for nausea or vomiting. Qty: 12 tablet, Refills: 0      CONTINUE these medications which have NOT CHANGED   Details  acetaminophen (TYLENOL) 325 MG tablet Take 2  tablets (650 mg total) by mouth every 4 (four) hours as needed for mild pain (or temp > 99 F).    allopurinol (ZYLOPRIM) 100 MG tablet Take 100 mg by mouth daily.    Ascorbic Acid (VITAMIN C) 1000 MG tablet Take 1,000 mg by mouth daily.    dexamethasone (DECADRON) 4 MG tablet Take 1 tablet (4 mg total) by mouth every 6 (six) hours. Qty: 50 tablet, Refills: 0    doxazosin (CARDURA) 4 MG  tablet Take 4 mg by mouth daily.    fluticasone (FLONASE) 50 MCG/ACT nasal spray Place 2 sprays into the nose daily as needed for allergies.  Refills: 0    furosemide (LASIX) 40 MG tablet Take 40 mg by mouth 2 (two) times daily.    lisinopril (PRINIVIL,ZESTRIL) 10 MG tablet Take 10 mg by mouth daily. Refills: 2    metFORMIN (GLUCOPHAGE) 1000 MG tablet Take 1,000 mg by mouth daily with breakfast.    metoprolol succinate (TOPROL-XL) 50 MG 24 hr tablet Take 1 tablet (50 mg total) by mouth daily. Qty: 90 tablet, Refills: 3    mirabegron ER (MYRBETRIQ) 50 MG TB24 tablet Take 50 mg by mouth daily.    pantoprazole (PROTONIX) 40 MG tablet Take 1 tablet (40 mg total) by mouth at bedtime. Qty: 30 tablet, Refills: 0    pravastatin (PRAVACHOL) 80 MG tablet Take 80 mg by mouth daily.    zolpidem (AMBIEN) 5 MG tablet Take 1 tablet (5 mg total) by mouth at bedtime as needed for sleep. Qty: 10 tablet, Refills: 0      STOP taking these medications     ciprofloxacin (CIPRO) 500 MG tablet      doxycycline (VIBRAMYCIN) 100 MG capsule      metoprolol (LOPRESSOR) 50 MG tablet        Allergies  Allergen Reactions  . Penicillins Rash   Follow-up Information    Follow up with Tivis Ringer, MD. Schedule an appointment as soon as possible for a visit in 3 days.   Specialty:  Internal Medicine   Why:  for close follow up of your vomiting   Contact information:   [place] [address] [phone]        The results of significant diagnostics from this hospitalization (including imaging, microbiology, ancillary and laboratory) are listed below for reference.    Significant Diagnostic Studies: Ct Chest W Contrast  04/28/2015   CLINICAL DATA:  Lung cancer with brain metastasis diagnosed 2/16. Increase shortness of breath for 3 weeks. Radiation therapy complete in February. CHF. COPD. Prior stroke.  EXAM: CT CHEST WITH CONTRAST  TECHNIQUE: Multidetector CT imaging of the  chest was performed during intravenous contrast administration.  CONTRAST:  50m OMNIPAQUE IOHEXOL 300 MG/ML  SOLN  COMPARISON:  03/25/2015.  Chest CT 01/11/2015  FINDINGS: Mediastinum/Nodes: Tiny right thyroid nodule is nonspecific. No supraclavicular adenopathy. Aortic and branch vessel atherosclerosis. Borderline cardiomegaly with lipomatous hypertrophy of the interatrial septum. Multivessel coronary artery atherosclerosis. No central pulmonary embolism, on this non-dedicated study. An upper normal size 9 mm AP window node is not significantly changed. No hilar adenopathy.  Lungs/Pleura: No pleural fluid. Mild centrilobular emphysema. Probable secretions along the right-sided trachea including on image 13.  Cavitary subpleural right upper lobe pulmonary nodule measures 1.6 x 1.3 cm on image 25. On the prior exam, this measured 1.6 by 1.1 cm. Similar morphology.  There may be a developing nodule just anterior and superior to this, on the order of 6 mm  on image 23 of series 6. Bibasilar scarring.  A left upper lobe 7 mm minimally cavitary lung nodule on image 22 is new.  central left lower lobe 6 mm nodule on image 34 is either new or enlarged since the prior exam (at a vascular branch point).  The left lower lobe lung nodules x2 which were surrounded by hemorrhage on the prior exam have resolved. No new consolidation.  Upper abdomen: Normal imaged portions of the liver, spleen, stomach, pancreas, gallbladder, biliary tract, kidneys. Similar mild adrenal thickening. Surgical changes of the left upper quadrant.  Musculoskeletal: Thoracic spondylosis. A left-sided T1 vertebral body lucent lesion has been present back to 08/25/2014 and not significantly hypermetabolic on prior PET.  IMPRESSION: 1. Mixed response to therapy of pulmonary nodules. Left lower lobe known nodules have resolved. However, there is a new left upper lobe nodule and a new or enlarged central left lower lobe nodule. The right upper lobe cavitary  lesion is unchanged. 2. No thoracic adenopathy ; upper normal sized AP window node is not significantly changed. 3.  Atherosclerosis, including within the coronary arteries. 4. Chronic lucent lesion within the left side of the T1 vertebral body is favored to be benign.   Electronically Signed   By: Abigail Miyamoto M.D.   On: 04/28/2015 14:53   Ct Abdomen Pelvis W Contrast  05/25/2015   CLINICAL DATA:  81 year old male with left lower quadrant abdominal pain and nausea.  EXAM: CT ABDOMEN AND PELVIS WITH CONTRAST  TECHNIQUE: Multidetector CT imaging of the abdomen and pelvis was performed using the standard protocol following bolus administration of intravenous contrast.  CONTRAST:  141m OMNIPAQUE IOHEXOL 300 MG/ML  SOLN  COMPARISON:  Radiograph dated 05/24/2015  FINDINGS: The visualized lung bases are clear. There is coronary vascular calcification. No intra-abdominal free air or free fluid.  The liver, gallbladder, pancreas, spleen, adrenal glands, kidneys, visualized ureters, and urinary bladder appear unremarkable. There are calcifications of the prostate gland. Multiple surgical clips noted in the upper abdomen.  There is sigmoid diverticulosis with muscular hypertrophy. No active inflammation. There are scattered colonic diverticula without acute inflammatory changes. There is no evidence of bowel obstruction. Normal appendix.  There is aortoiliac atherosclerotic disease. The abdominal aorta is tortuous. There is a 2.7 cm infrarenal abdominal aortic ectasia. The origins of the celiac axis, SMA, IMA remain patent. No portal venous gas identified. An infrarenal IVC filter noted. There is no adenopathy.  Midline vertical anterior pelvic wall incisional scar. There is abutment of loops of small bowel to the anterior peritoneal wall compatible with adhesions. There is extensive degenerative changes of the spine. No acute fracture.  IMPRESSION: Sigmoid diverticulosis. No definite active inflammation. No bowel  obstruction. Normal appendix.   Electronically Signed   By: AAnner CreteM.D.   On: 05/25/2015 01:36   Dg Abd Acute W/chest  05/24/2015   CLINICAL DATA:  Abdominal pain, nausea and diarrhea 1 day. History of lung cancer.  EXAM: DG ABDOMEN ACUTE W/ 1V CHEST  COMPARISON:  Chest x-ray 03/25/2015  FINDINGS: Lungs are adequately inflated without consolidation or effusion. Cardiomediastinal silhouette is within normal. Remainder of the thorax is unchanged.  Abdominal images including right lateral decubitus film demonstrate a nonobstructive bowel gas pattern with relative paucity of gas within the colon. No evidence of free peritoneal air. There are multiple surgical clips over the left upper quadrant. IVC filter is present with superior tip at the L2-3 level. Moderate degenerative changes of the spine and mild degenerative  change of the hips.  IMPRESSION: Nonobstructive bowel gas pattern.  No acute cardiopulmonary disease.   Electronically Signed   By: Marin Olp M.D.   On: 05/24/2015 21:55    Microbiology: No results found for this or any previous visit (from the past 240 hour(s)).   Labs: Basic Metabolic Panel:  Recent Labs Lab 05/24/15 2117 05/25/15 0504 05/26/15 0510  NA 141 142 141  K 3.8 4.1 3.6  CL 104 104 109  CO2 24  --  24  GLUCOSE 193* 98 94  BUN 27* 34* 7  CREATININE 1.25* 0.80 0.70  CALCIUM 8.7*  --  7.9*   Liver Function Tests:  Recent Labs Lab 05/24/15 2117  AST 28  ALT 18  ALKPHOS 61  BILITOT 0.7  PROT 5.9*  ALBUMIN 3.1*    Recent Labs Lab 05/24/15 2117  LIPASE 24   No results for input(s): AMMONIA in the last 168 hours. CBC:  Recent Labs Lab 05/24/15 2117 05/25/15 0504 05/26/15 0804  WBC 11.2*  --  6.9  NEUTROABS 9.1*  --   --   HGB 12.7* 11.9* 9.8*  HCT 39.3 35.0* 30.1*  MCV 96.1  --  96.8  PLT 135*  --  PENDING   Cardiac Enzymes: No results for input(s): CKTOTAL, CKMB, CKMBINDEX, TROPONINI in the last 168 hours. BNP: BNP (last 3  results) No results for input(s): BNP in the last 8760 hours.  ProBNP (last 3 results) No results for input(s): PROBNP in the last 8760 hours.  CBG:  Recent Labs Lab 05/25/15 0724 05/25/15 1138 05/25/15 1748 05/25/15 2204 05/26/15 0702  GLUCAP 85 121* 122* 136* 94       Signed:  ELMAHI,MUTAZ A  [facility] 05/26/2015, 10:59 AM

## 2015-05-26 NOTE — Progress Notes (Signed)
[facility]) Chaplain Visit: Met with wife Izora Gala at bedside as pt rested, and she was open about pt's decline with sleeping more, not eating or drinking and getting severely dehydrated.  Discussed how she and pt's son Ronalee Belts will be looking at options together, and thankful for faith support and prayer for guidance and wisdom.  Family pastor to visit later today.  Pt roused but very weak, able to interact briefly for reassurance and prayer. Carlus Pavlov, [facility] Chaplain, [facility]

## 2015-05-27 LAB — HEMOGLOBIN A1C
HEMOGLOBIN A1C: 7.1 % — AB (ref 4.8–5.6)
Mean Plasma Glucose: 157 mg/dL

## 2015-06-05 ENCOUNTER — Other Ambulatory Visit: Payer: Self-pay | Admitting: Radiation Therapy

## 2015-06-05 ENCOUNTER — Encounter: Payer: Self-pay | Admitting: Radiation Therapy

## 2015-06-05 DIAGNOSIS — C7931 Secondary malignant neoplasm of brain: Secondary | ICD-10-CM

## 2015-06-10 DEATH — deceased

## 2015-06-26 ENCOUNTER — Ambulatory Visit: Payer: Self-pay | Admitting: Radiation Oncology

## 2015-08-27 ENCOUNTER — Encounter: Payer: Self-pay | Admitting: Radiation Therapy

## 2016-12-08 IMAGING — CT CT CHEST W/O CM
1 of 2 series · 14 of 32 positions shown, 18 images · non-contrast
Comparison: CT scan 08/25/2014 and PET-CT 09/01/2014

CLINICAL DATA: One-week history of hemoptysis. History of right
lung cancer status post radiation therapy.

EXAM:
CT CHEST WITHOUT CONTRAST
TECHNIQUE: Multidetector CT imaging of the chest was performed following the
standard protocol without IV contrast.

[Series 2: rtn chest without st · axial · non-contrast · 0.91mm/px · z∈[-308,+2]mm · 14 of 74 slices shown, 18 images]
[im 6/74  mediastinal]
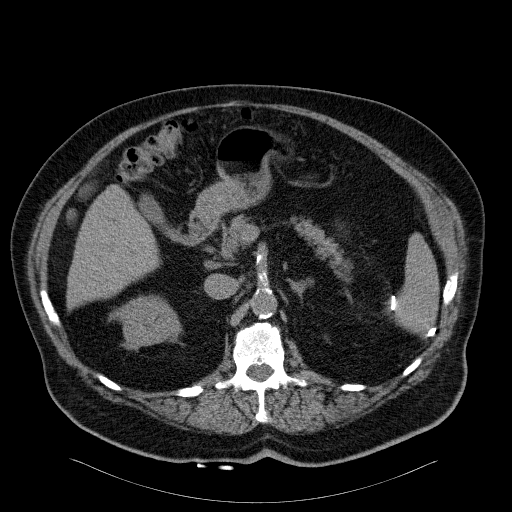
[im 6/74  lung]
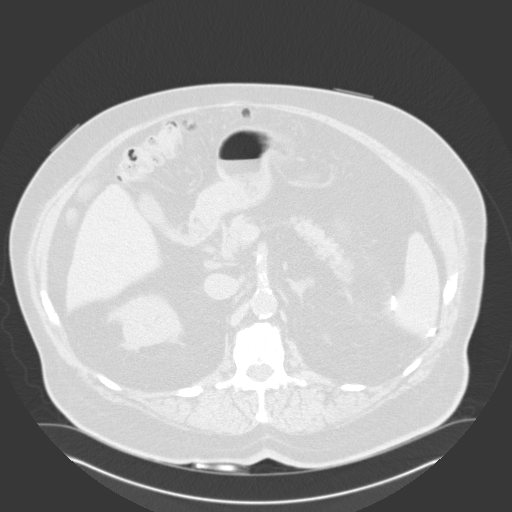
[im 12/74  lung]
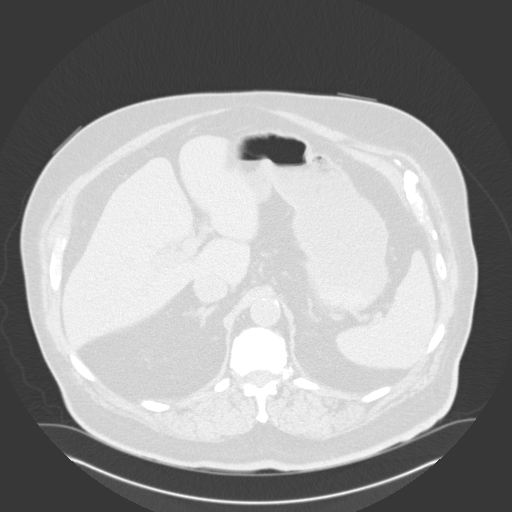
[im 17/74  lung]
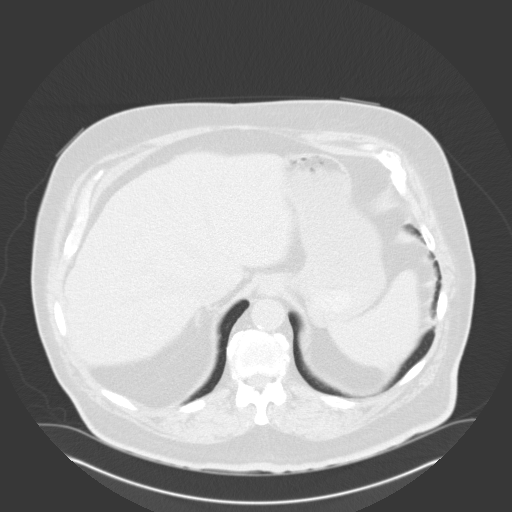
[im 23/74  lung]
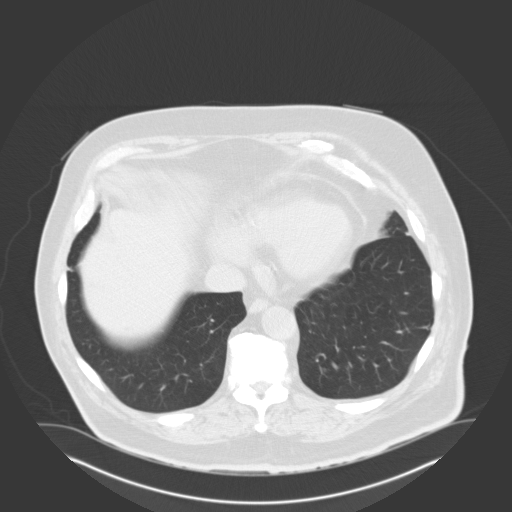
[im 29/74  mediastinal]
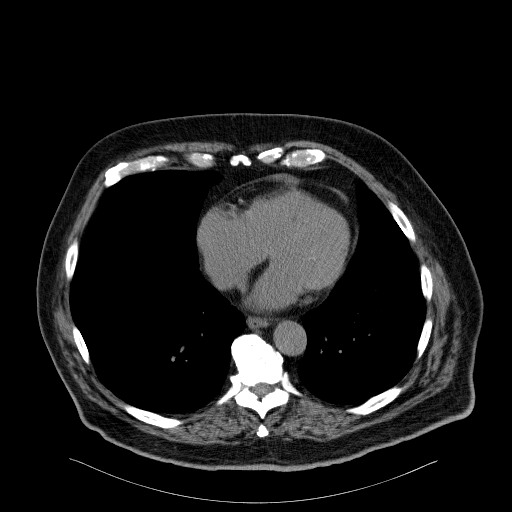
[im 29/74  lung]
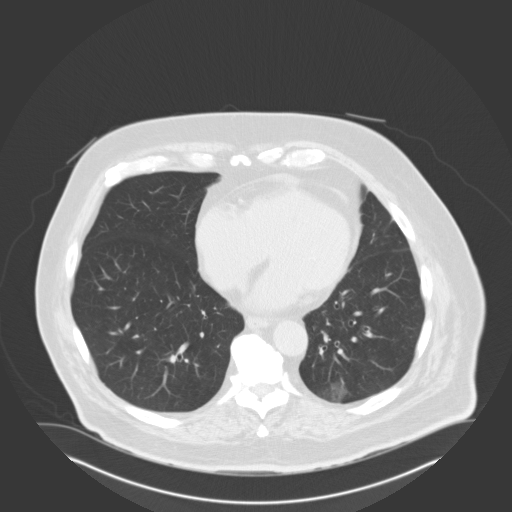
[im 34/74  lung]
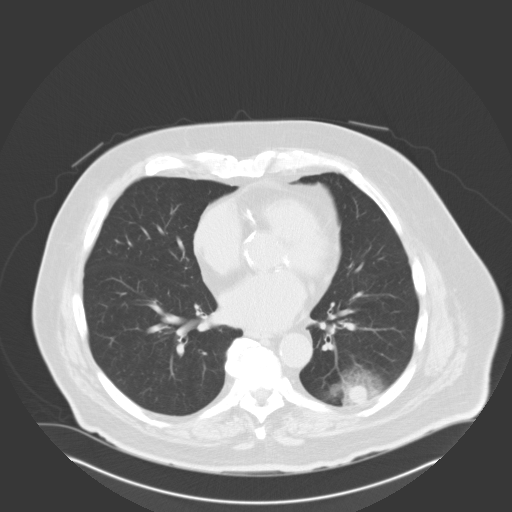
[im 36/74  lung]
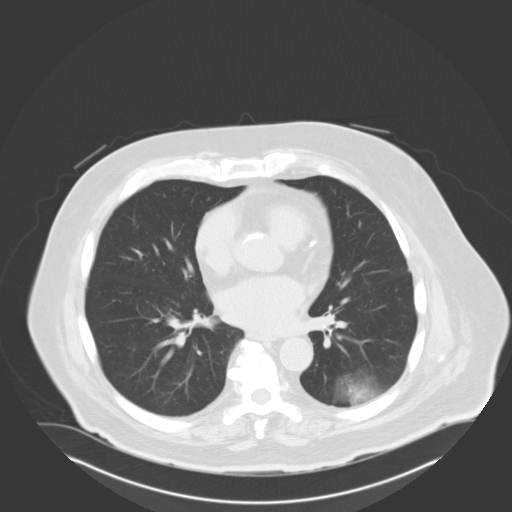
[im 37/74  lung]
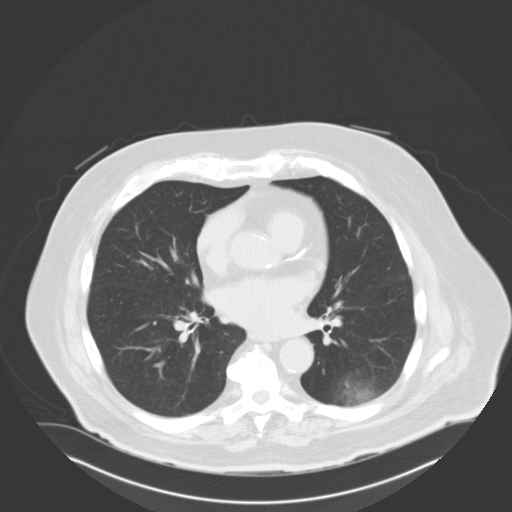
[im 40/74  mediastinal]
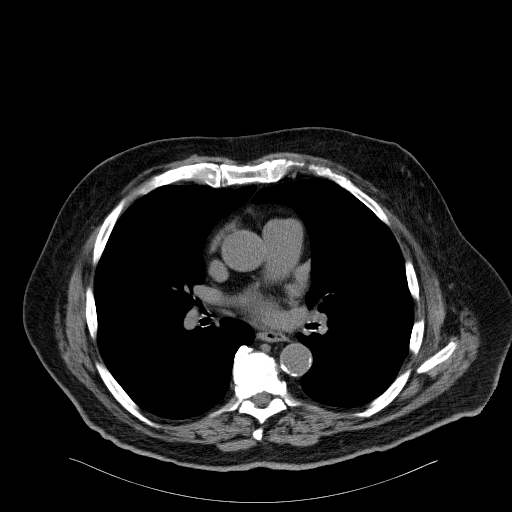
[im 40/74  lung]
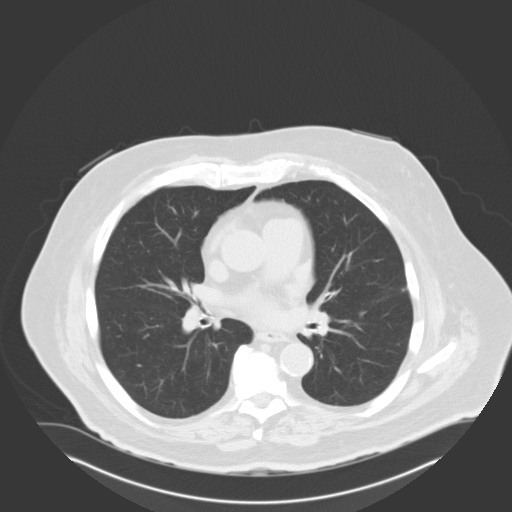
[im 45/74  lung]
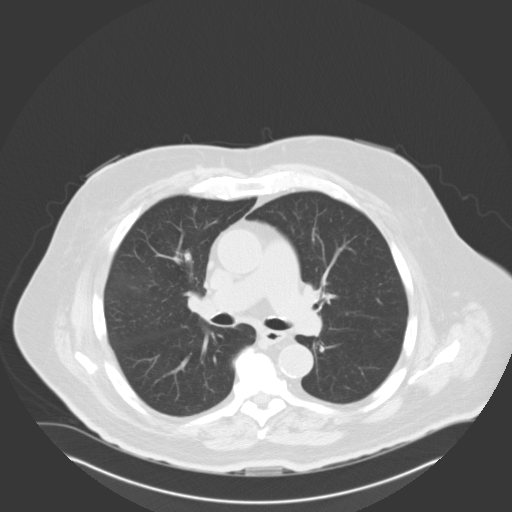
[im 51/74  lung]
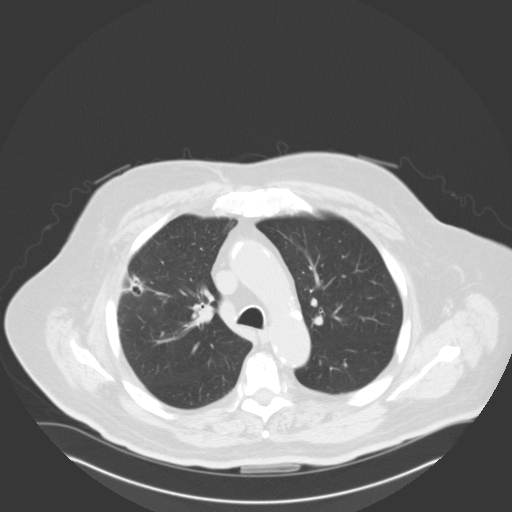
[im 57/74  lung]
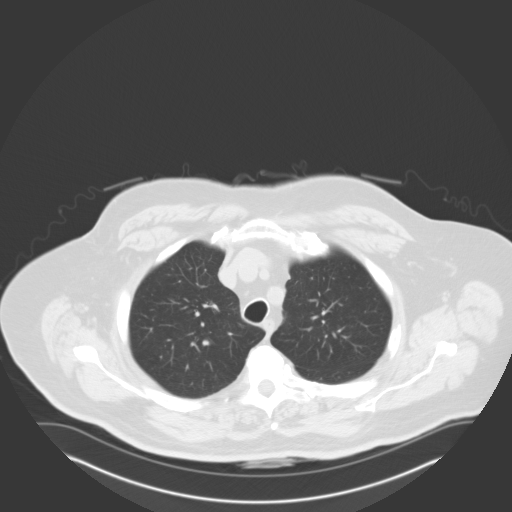
[im 62/74  mediastinal]
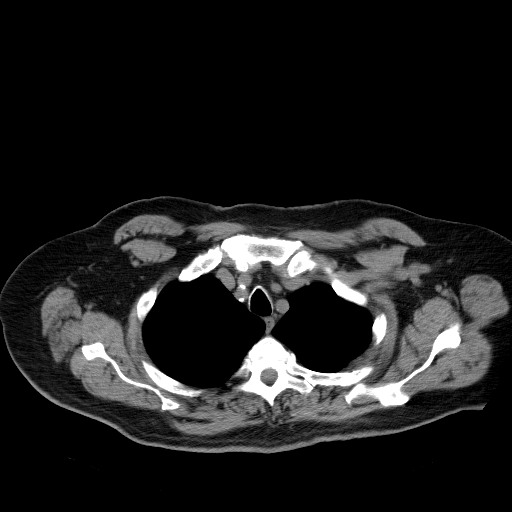
[im 62/74  lung]
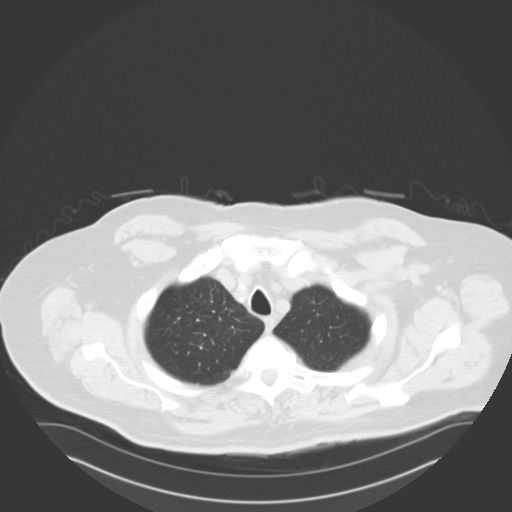
[im 68/74  lung]
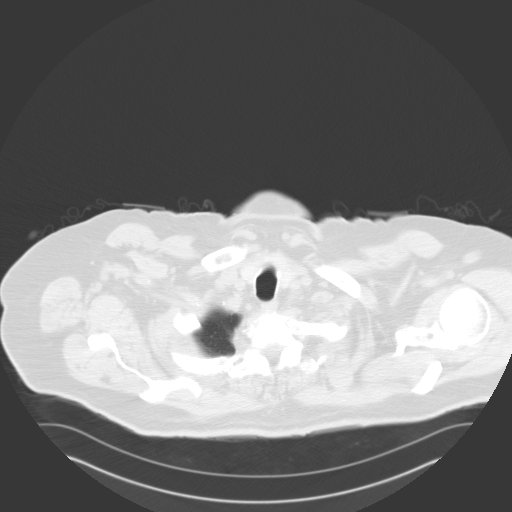

[14 of 32 positions shown; findings below may reference images not displayed]

FINDINGS: Chest wall: No chest wall mass, supraclavicular or axillary
lymphadenopathy. The thyroid gland is grossly normal. The bony
thorax is intact. No destructive bone lesions or spinal canal
compromise. Stable advanced degenerative changes involving the
thoracic spine and sternoclavicular joints.

Mediastinum: The heart is normal in size. No pericardial effusion.
No mediastinal or hilar mass or lymphadenopathy. A few small
scattered lymph nodes are stable. Stable three-vessel coronary
artery calcifications and atherosclerotic calcifications involving
the aorta and branch vessels. No focal aneurysm. The esophagus is
grossly normal.

Lungs/ pleura: The right upper lobe cavitary lesion is significantly
improved when compared to prior examinations. On the most recent CT
scan from 09/15/2014 where the lesion was biopsied it measured
x 29.5 mm. It now measures 16 x 11 mm. There are however 2 new left
lower lobe pulmonary nodules. The largest lesion measures 16 mm on
image number 41 in the smaller adjacent lesion measures 5.5 mm on
image number 45. AC ground-glass opacity surrounds both lesions and
is consistent with surrounding hemorrhage. This likely explains the
patient's recent hemoptysis. There is also a 4.5 mm nodule in the
left lower lobe on image number 36.

No pleural effusion.

Upper abdomen: Stable slight nodularity involving the lateral limb
of the right adrenal gland and stable small lesion involving the
right adrenal gland.
IMPRESSION: 1. Significant interval decrease in size of the right upper lobe
pulmonary lesion as discussed above. This would suggest a good
response to radiation therapy.
2. Two new left lower lobe pulmonary lesions with surrounding
hemorrhage likely accounting for the patient's hemoptysis. This is
likely metastatic disease. There is also a third nodule in the left
lower lobe measuring 4.5 mm.
3. No mediastinal or hilar mass or adenopathy.
4. Stable small adrenal gland nodules.

## 2017-01-27 IMAGING — CT CT HEAD W/O CM
1 series · 15 of 30 positions shown, 19 images · non-contrast
Comparison: 10/10/2014

CLINICAL DATA: Frontal headache for 14 days. History of lung
cancer.

EXAM:
CT HEAD WITHOUT CONTRAST
TECHNIQUE: Contiguous axial images were obtained from the base of the skull
through the vertex without intravenous contrast.

[Series 2: head w/(date) · axial · 0.51mm/px · z∈[-105,+30]mm · 15 of 31 slices shown, 19 images]
[im 2/31  brain]
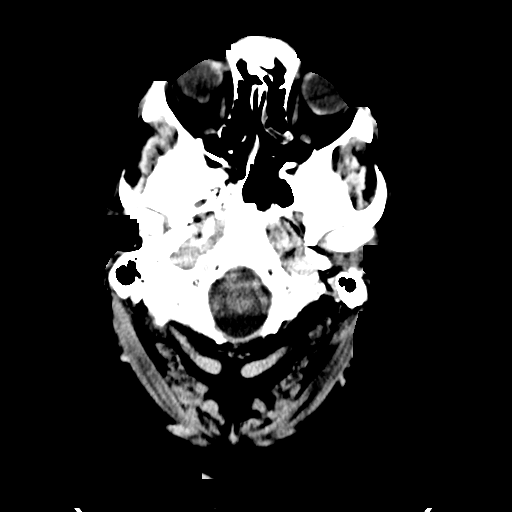
[im 2/31  bone]
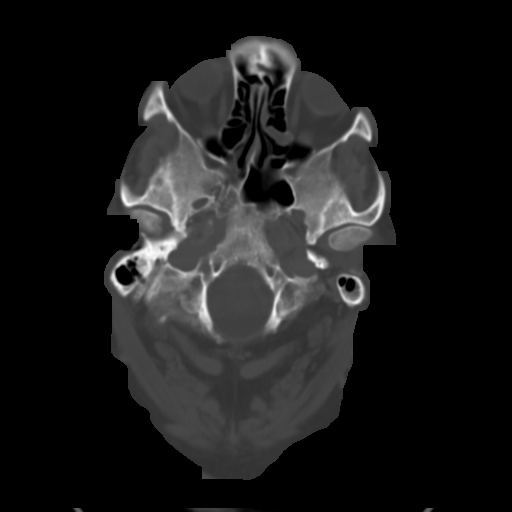
[im 4/31  brain]
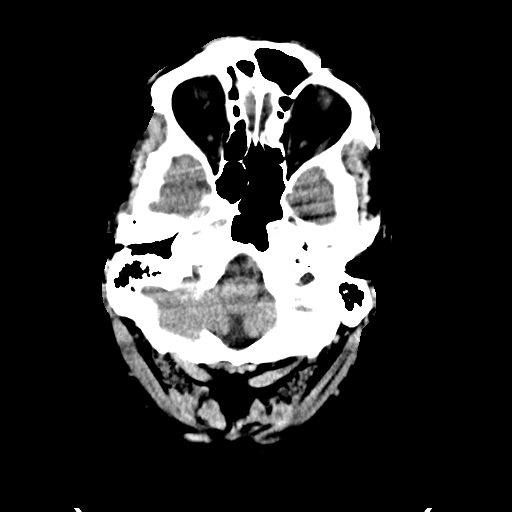
[im 6/31  brain]
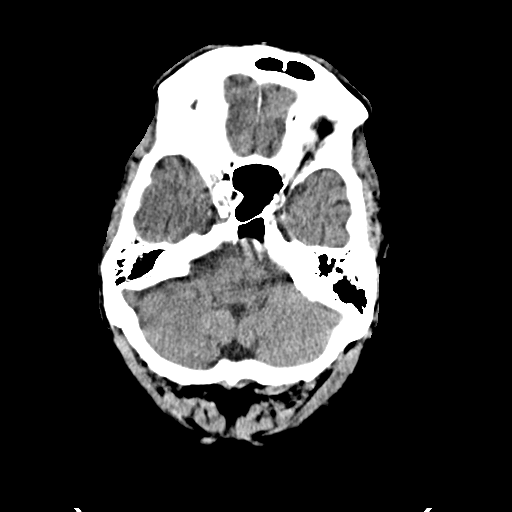
[im 8/31  brain]
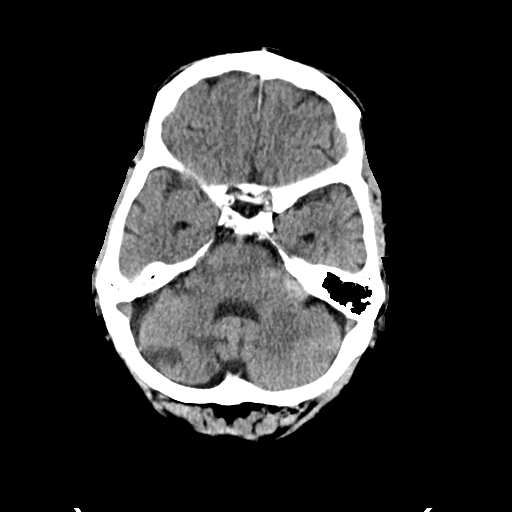
[im 10/31  brain]
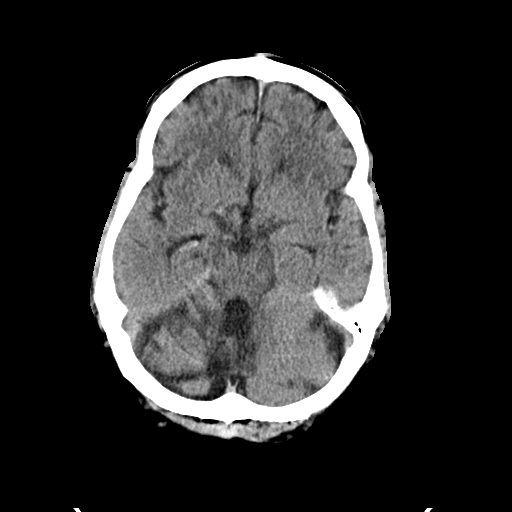
[im 10/31  bone]
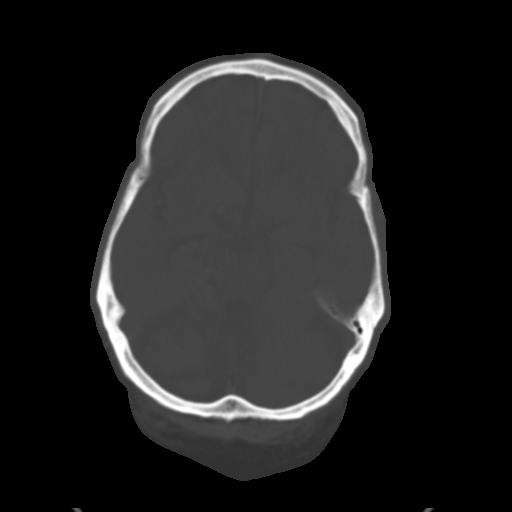
[im 12/31  brain]
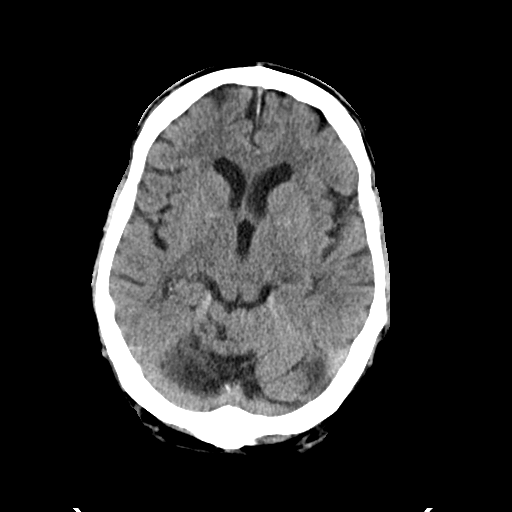
[im 14/31  brain]
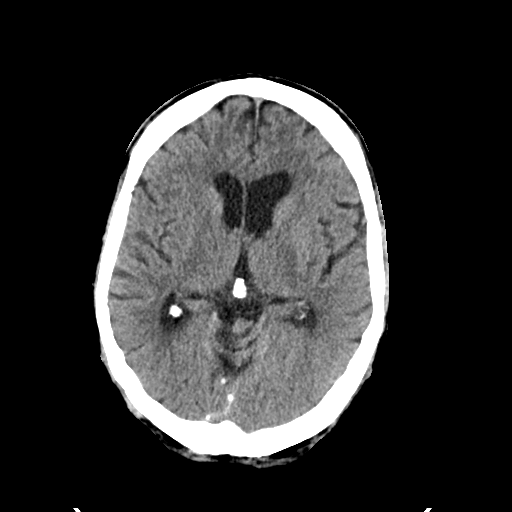
[im 16/31  brain]
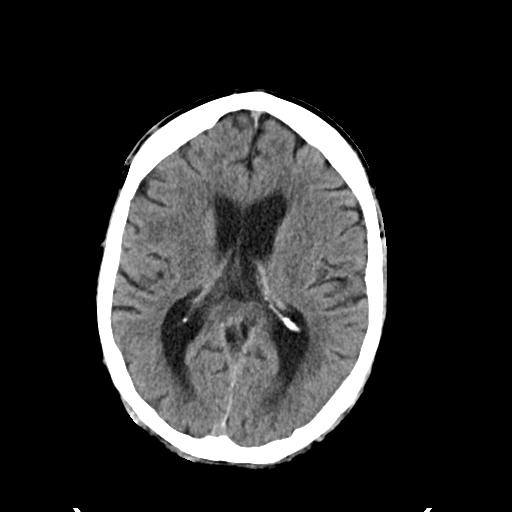
[im 17/31  brain]
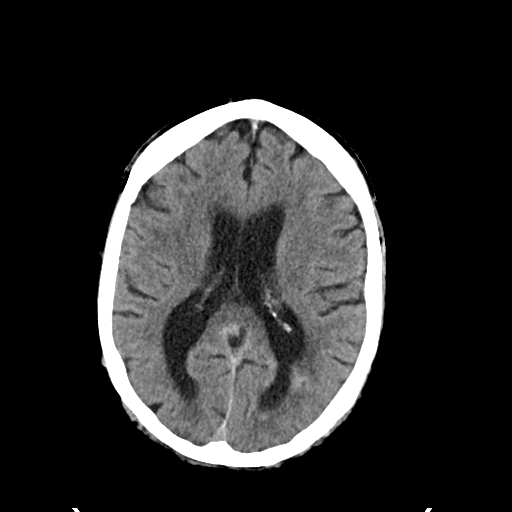
[im 17/31  bone]
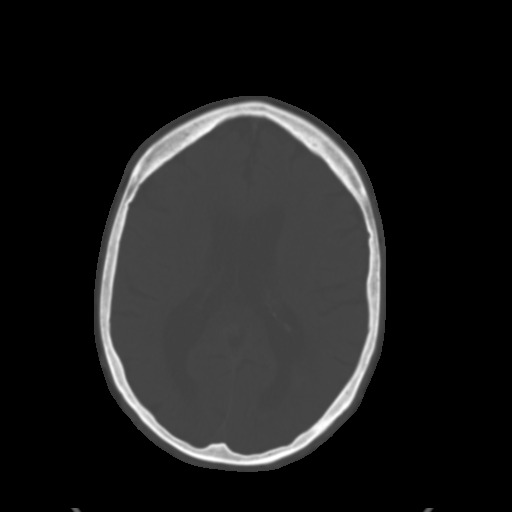
[im 19/31  brain]
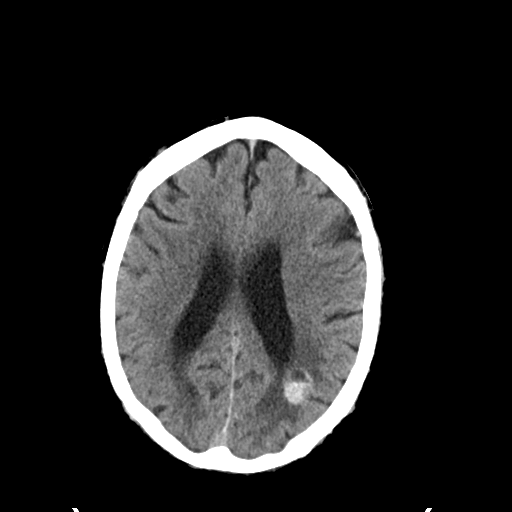
[im 21/31  brain]
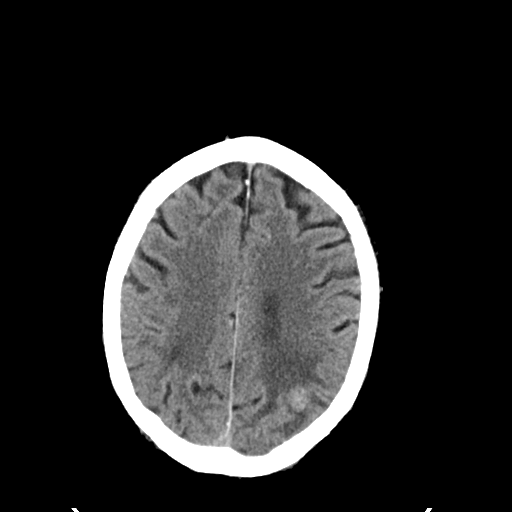
[im 23/31  brain]
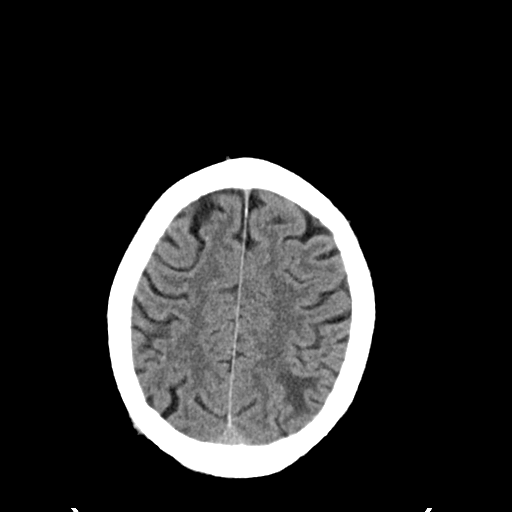
[im 25/31  brain]
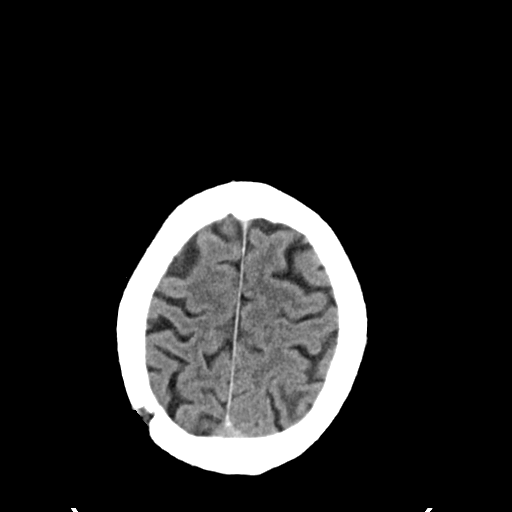
[im 25/31  bone]
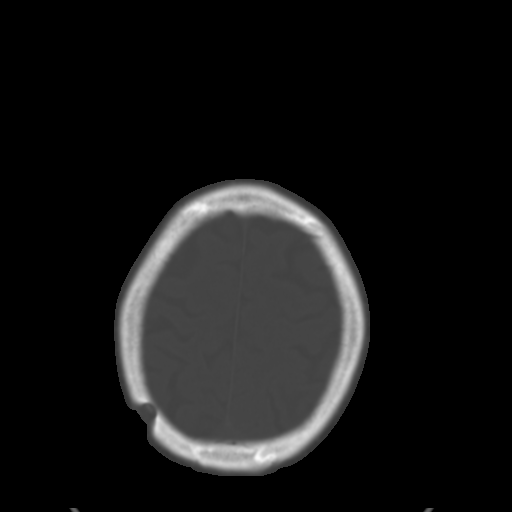
[im 27/31  brain]
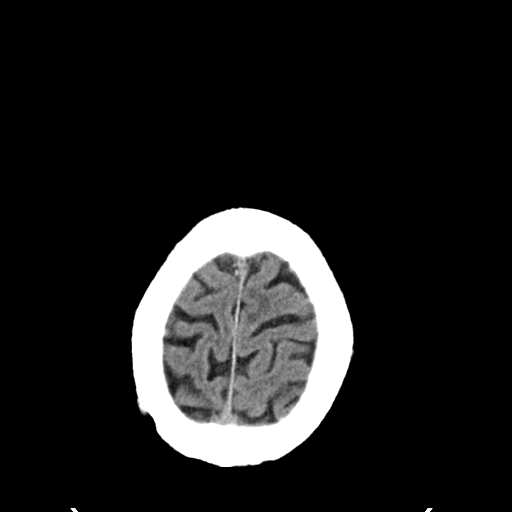
[im 29/31  brain]
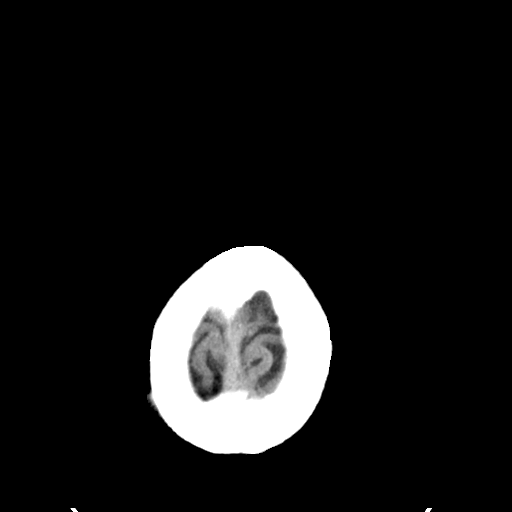

[15 of 30 positions shown; findings below may reference images not displayed]

FINDINGS: Skull and Sinuses:Burr-hole in the right parietal bone. No evidence
of osseous metastatic disease.

Orbits: No acute abnormality.

Brain: 18 mm hematoma in the left parietal white matter around the
lateral ventricle. There is a fluid hematocrit levels suggesting
intracystic hemorrhage. Small volume hemorrhage present in the
occipital horn of the left lateral ventricle which is not
obstructed. There is neighboring vasogenic edema. No additional mass
lesion is seen.

Remote right superior cerebellum infarction with gliosis.

Generalized brain atrophy.

Critical Value/emergent results were called by telephone at the time
of interpretation on 03/02/2015 at [DATE] to Dr. WAN HING DIAN ,
who verbally acknowledged these results. The patient will be sent
for workup at [REDACTED].
IMPRESSION: 19 mm hematoma in the left parietal white matter with small
intraventricular extension. Recommend updated brain MRI with
contrast as this is primarily concerning for new metastatic disease.
